# Patient Record
Sex: Female | Born: 1950 | Race: White | Hispanic: No | Smoking: Former smoker
Health system: Southern US, Community
[De-identification: ages and names within clinical notes are randomized; demographics above are authoritative.]

## PROBLEM LIST (undated history)

## (undated) DIAGNOSIS — F419 Anxiety disorder, unspecified: Secondary | ICD-10-CM

## (undated) DIAGNOSIS — E039 Hypothyroidism, unspecified: Secondary | ICD-10-CM

## (undated) DIAGNOSIS — Z9289 Personal history of other medical treatment: Secondary | ICD-10-CM

## (undated) DIAGNOSIS — K589 Irritable bowel syndrome without diarrhea: Secondary | ICD-10-CM

## (undated) DIAGNOSIS — K449 Diaphragmatic hernia without obstruction or gangrene: Secondary | ICD-10-CM

## (undated) DIAGNOSIS — R740 Nonspecific elevation of levels of transaminase and lactic acid dehydrogenase [LDH]: Secondary | ICD-10-CM

## (undated) DIAGNOSIS — I514 Myocarditis, unspecified: Secondary | ICD-10-CM

## (undated) DIAGNOSIS — K5730 Diverticulosis of large intestine without perforation or abscess without bleeding: Secondary | ICD-10-CM

## (undated) DIAGNOSIS — R7989 Other specified abnormal findings of blood chemistry: Secondary | ICD-10-CM

## (undated) DIAGNOSIS — I272 Pulmonary hypertension, unspecified: Secondary | ICD-10-CM

## (undated) DIAGNOSIS — E669 Obesity, unspecified: Secondary | ICD-10-CM

## (undated) DIAGNOSIS — B029 Zoster without complications: Secondary | ICD-10-CM

## (undated) DIAGNOSIS — R945 Abnormal results of liver function studies: Secondary | ICD-10-CM

## (undated) DIAGNOSIS — R7401 Elevation of levels of liver transaminase levels: Secondary | ICD-10-CM

## (undated) DIAGNOSIS — F329 Major depressive disorder, single episode, unspecified: Secondary | ICD-10-CM

## (undated) DIAGNOSIS — K621 Rectal polyp: Secondary | ICD-10-CM

## (undated) DIAGNOSIS — K219 Gastro-esophageal reflux disease without esophagitis: Secondary | ICD-10-CM

## (undated) DIAGNOSIS — K620 Anal polyp: Secondary | ICD-10-CM

## (undated) DIAGNOSIS — F32A Depression, unspecified: Secondary | ICD-10-CM

## (undated) HISTORY — DX: Gastro-esophageal reflux disease without esophagitis: K21.9

## (undated) HISTORY — DX: Other specified abnormal findings of blood chemistry: R79.89

## (undated) HISTORY — DX: Personal history of other medical treatment: Z92.89

## (undated) HISTORY — PX: TONSILLECTOMY AND ADENOIDECTOMY: SHX28

## (undated) HISTORY — DX: Anxiety disorder, unspecified: F41.9

## (undated) HISTORY — DX: Anal polyp: K62.0

## (undated) HISTORY — DX: Irritable bowel syndrome, unspecified: K58.9

## (undated) HISTORY — DX: Rectal polyp: K62.1

## (undated) HISTORY — DX: Zoster without complications: B02.9

## (undated) HISTORY — DX: Diverticulosis of large intestine without perforation or abscess without bleeding: K57.30

## (undated) HISTORY — DX: Diaphragmatic hernia without obstruction or gangrene: K44.9

## (undated) HISTORY — DX: Obesity, unspecified: E66.9

## (undated) HISTORY — DX: Abnormal results of liver function studies: R94.5

## (undated) HISTORY — DX: Depression, unspecified: F32.A

## (undated) HISTORY — PX: CHOLECYSTECTOMY: SHX55

## (undated) HISTORY — DX: Major depressive disorder, single episode, unspecified: F32.9

---

## 2007-09-07 ENCOUNTER — Ambulatory Visit: Payer: Self-pay | Admitting: Internal Medicine

## 2007-09-07 DIAGNOSIS — K219 Gastro-esophageal reflux disease without esophagitis: Secondary | ICD-10-CM | POA: Insufficient documentation

## 2007-09-07 DIAGNOSIS — R030 Elevated blood-pressure reading, without diagnosis of hypertension: Secondary | ICD-10-CM

## 2007-09-07 DIAGNOSIS — Z9189 Other specified personal risk factors, not elsewhere classified: Secondary | ICD-10-CM | POA: Insufficient documentation

## 2007-09-07 DIAGNOSIS — R51 Headache: Secondary | ICD-10-CM | POA: Insufficient documentation

## 2007-09-07 DIAGNOSIS — K620 Anal polyp: Secondary | ICD-10-CM

## 2007-09-07 DIAGNOSIS — M129 Arthropathy, unspecified: Secondary | ICD-10-CM | POA: Insufficient documentation

## 2007-09-07 DIAGNOSIS — J309 Allergic rhinitis, unspecified: Secondary | ICD-10-CM | POA: Insufficient documentation

## 2007-09-07 DIAGNOSIS — K621 Rectal polyp: Secondary | ICD-10-CM

## 2007-09-07 DIAGNOSIS — F329 Major depressive disorder, single episode, unspecified: Secondary | ICD-10-CM

## 2007-09-07 DIAGNOSIS — R519 Headache, unspecified: Secondary | ICD-10-CM | POA: Insufficient documentation

## 2007-09-07 HISTORY — DX: Anal polyp: K62.0

## 2007-09-21 ENCOUNTER — Ambulatory Visit: Payer: Self-pay | Admitting: Internal Medicine

## 2007-09-21 LAB — CONVERTED CEMR LAB: Vit D, 1,25-Dihydroxy: 8 — ABNORMAL LOW (ref 30–89)

## 2007-09-23 LAB — CONVERTED CEMR LAB
ALT: 14 units/L (ref 0–35)
Albumin: 3.7 g/dL (ref 3.5–5.2)
Alkaline Phosphatase: 72 units/L (ref 39–117)
BUN: 9 mg/dL (ref 6–23)
Basophils Absolute: 0 10*3/uL (ref 0.0–0.1)
Basophils Relative: 0.7 % (ref 0.0–1.0)
CO2: 33 meq/L — ABNORMAL HIGH (ref 19–32)
Chloride: 106 meq/L (ref 96–112)
Cholesterol: 171 mg/dL (ref 0–200)
GFR calc non Af Amer: 79 mL/min
LDL Cholesterol: 119 mg/dL — ABNORMAL HIGH (ref 0–99)
Monocytes Absolute: 0.4 10*3/uL (ref 0.2–0.7)
Neutrophils Relative %: 60.8 % (ref 43.0–77.0)
Platelets: 228 10*3/uL (ref 150–400)
TSH: 7.32 microintl units/mL — ABNORMAL HIGH (ref 0.35–5.50)
Total Bilirubin: 1.4 mg/dL — ABNORMAL HIGH (ref 0.3–1.2)
Total CHOL/HDL Ratio: 4.9
Total Protein: 6.7 g/dL (ref 6.0–8.3)
Triglycerides: 86 mg/dL (ref 0–149)

## 2007-09-24 LAB — CONVERTED CEMR LAB
Bilirubin Urine: NEGATIVE
Protein, U semiquant: NEGATIVE
Urobilinogen, UA: 0.2
WBC Urine, dipstick: NEGATIVE
pH: 6

## 2007-09-26 LAB — CONVERTED CEMR LAB
ALT: 14 units/L (ref 0–35)
AST: 26 units/L (ref 0–37)
Albumin: 3.7 g/dL (ref 3.5–5.2)
Basophils Relative: 0.7 % (ref 0.0–1.0)
Bilirubin, Direct: 0.2 mg/dL (ref 0.0–0.3)
Calcium: 9.3 mg/dL (ref 8.4–10.5)
Cholesterol: 171 mg/dL (ref 0–200)
Creatinine, Ser: 0.8 mg/dL (ref 0.4–1.2)
Eosinophils Relative: 3.7 % (ref 0.0–5.0)
GFR calc non Af Amer: 79 mL/min
HCT: 38.9 % (ref 36.0–46.0)
HDL: 34.8 mg/dL — ABNORMAL LOW (ref >39.0)
LDL Cholesterol: 119 mg/dL — ABNORMAL HIGH (ref 0–99)
MCHC: 34.4 g/dL (ref 30.0–36.0)
Monocytes Absolute: 0.4 10*3/uL (ref 0.2–0.7)
Neutrophils Relative %: 60.8 % (ref 43.0–77.0)
Potassium: 4 meq/L (ref 3.5–5.1)
RBC: 4.32 M/uL (ref 3.87–5.11)
RDW: 12.9 % (ref 11.5–14.6)
Total Protein: 6.7 g/dL (ref 6.0–8.3)

## 2007-09-28 ENCOUNTER — Ambulatory Visit: Payer: Self-pay | Admitting: Internal Medicine

## 2007-09-28 DIAGNOSIS — E559 Vitamin D deficiency, unspecified: Secondary | ICD-10-CM | POA: Insufficient documentation

## 2007-09-28 DIAGNOSIS — E782 Mixed hyperlipidemia: Secondary | ICD-10-CM | POA: Insufficient documentation

## 2007-09-28 DIAGNOSIS — R946 Abnormal results of thyroid function studies: Secondary | ICD-10-CM | POA: Insufficient documentation

## 2007-09-29 ENCOUNTER — Encounter: Payer: Self-pay | Admitting: Internal Medicine

## 2007-11-11 LAB — HM COLONOSCOPY: HM Colonoscopy: NORMAL

## 2007-11-16 ENCOUNTER — Telehealth (INDEPENDENT_AMBULATORY_CARE_PROVIDER_SITE_OTHER): Payer: Self-pay | Admitting: *Deleted

## 2007-11-23 ENCOUNTER — Encounter: Admission: RE | Admit: 2007-11-23 | Discharge: 2007-11-23 | Payer: Self-pay | Admitting: Internal Medicine

## 2007-11-30 ENCOUNTER — Ambulatory Visit: Payer: Self-pay | Admitting: Internal Medicine

## 2007-12-07 ENCOUNTER — Ambulatory Visit: Payer: Self-pay | Admitting: Internal Medicine

## 2007-12-10 ENCOUNTER — Encounter: Payer: Self-pay | Admitting: Internal Medicine

## 2007-12-30 ENCOUNTER — Ambulatory Visit: Payer: Self-pay | Admitting: Gastroenterology

## 2007-12-30 LAB — CONVERTED CEMR LAB
ALT: 13 units/L (ref 0–35)
AST: 20 units/L (ref 0–37)
Bilirubin, Direct: 0.1 mg/dL (ref 0.0–0.3)
CO2: 29 meq/L (ref 19–32)
Calcium: 9.6 mg/dL (ref 8.4–10.5)
Creatinine, Ser: 0.8 mg/dL (ref 0.4–1.2)
Ferritin: 35.7 ng/mL (ref 10.0–291.0)
GFR calc Af Amer: 96 mL/min
GFR calc non Af Amer: 79 mL/min
Glucose, Bld: 81 mg/dL (ref 70–99)
HCT: 39.8 % (ref 36.0–46.0)
Lymphocytes Relative: 32.3 % (ref 12.0–46.0)
MCHC: 32.5 g/dL (ref 30.0–36.0)
Monocytes Absolute: 0.5 10*3/uL (ref 0.2–0.7)
Monocytes Relative: 8.7 % (ref 3.0–11.0)
Neutrophils Relative %: 55.2 % (ref 43.0–77.0)
Platelets: 241 10*3/uL (ref 150–400)
RDW: 13.3 % (ref 11.5–14.6)
TSH: 3.45 microintl units/mL (ref 0.35–5.50)
Tissue Transglutaminase Ab, IgA: 0.4 units (ref <7)
Total Bilirubin: 1 mg/dL (ref 0.3–1.2)
Total Protein: 7.1 g/dL (ref 6.0–8.3)
Vitamin B-12: 193 pg/mL — ABNORMAL LOW (ref 211–911)
WBC: 5.6 10*3/uL (ref 4.5–10.5)

## 2008-01-14 ENCOUNTER — Ambulatory Visit: Payer: Self-pay | Admitting: Gastroenterology

## 2008-01-21 ENCOUNTER — Ambulatory Visit: Payer: Self-pay | Admitting: Gastroenterology

## 2008-01-31 ENCOUNTER — Encounter: Payer: Self-pay | Admitting: Internal Medicine

## 2008-01-31 ENCOUNTER — Ambulatory Visit: Payer: Self-pay | Admitting: Gastroenterology

## 2008-01-31 ENCOUNTER — Encounter: Payer: Self-pay | Admitting: Gastroenterology

## 2008-01-31 DIAGNOSIS — K5730 Diverticulosis of large intestine without perforation or abscess without bleeding: Secondary | ICD-10-CM | POA: Insufficient documentation

## 2008-01-31 DIAGNOSIS — K449 Diaphragmatic hernia without obstruction or gangrene: Secondary | ICD-10-CM | POA: Insufficient documentation

## 2008-02-23 ENCOUNTER — Ambulatory Visit: Payer: Self-pay | Admitting: Internal Medicine

## 2008-02-27 LAB — CONVERTED CEMR LAB: Vit D, 1,25-Dihydroxy: 24 — ABNORMAL LOW (ref 30–89)

## 2008-02-29 ENCOUNTER — Ambulatory Visit: Payer: Self-pay | Admitting: Internal Medicine

## 2008-02-29 DIAGNOSIS — E063 Autoimmune thyroiditis: Secondary | ICD-10-CM | POA: Insufficient documentation

## 2008-03-28 ENCOUNTER — Ambulatory Visit: Payer: Self-pay | Admitting: Gastroenterology

## 2008-12-12 ENCOUNTER — Ambulatory Visit: Payer: Self-pay | Admitting: Internal Medicine

## 2008-12-12 DIAGNOSIS — M25519 Pain in unspecified shoulder: Secondary | ICD-10-CM

## 2008-12-25 LAB — CONVERTED CEMR LAB: TSH: 4.71 microintl units/mL (ref 0.35–5.50)

## 2009-01-23 ENCOUNTER — Telehealth: Payer: Self-pay | Admitting: *Deleted

## 2009-03-27 ENCOUNTER — Ambulatory Visit: Payer: Self-pay | Admitting: Internal Medicine

## 2009-03-30 ENCOUNTER — Telehealth: Payer: Self-pay | Admitting: Family Medicine

## 2009-04-05 ENCOUNTER — Ambulatory Visit: Payer: Self-pay | Admitting: Internal Medicine

## 2009-04-05 LAB — CONVERTED CEMR LAB
AST: 26 units/L (ref 0–37)
Albumin: 3.5 g/dL (ref 3.5–5.2)
Alkaline Phosphatase: 62 units/L (ref 39–117)
Amylase: 45 units/L (ref 27–131)
Basophils Absolute: 0 10*3/uL (ref 0.0–0.1)
Bilirubin, Direct: 0.1 mg/dL (ref 0.0–0.3)
Calcium: 8.7 mg/dL (ref 8.4–10.5)
Eosinophils Relative: 3.8 % (ref 0.0–5.0)
GFR calc non Af Amer: 92.02 mL/min (ref >60)
Glucose, Bld: 98 mg/dL (ref 70–99)
HCT: 37.1 % (ref 36.0–46.0)
HDL: 43.8 mg/dL (ref >39.00)
LDL Cholesterol: 110 mg/dL — ABNORMAL HIGH (ref 0–99)
Lipase: 17 units/L (ref 11.0–59.0)
Lymphocytes Relative: 28.5 % (ref 12.0–46.0)
Lymphs Abs: 1.2 10*3/uL (ref 0.7–4.0)
Monocytes Relative: 7.7 % (ref 3.0–12.0)
Neutrophils Relative %: 59.3 % (ref 43.0–77.0)
Platelets: 229 10*3/uL (ref 150.0–400.0)
Potassium: 4.1 meq/L (ref 3.5–5.1)
RDW: 13.3 % (ref 11.5–14.6)
Sodium: 141 meq/L (ref 135–145)
TSH: 3.82 microintl units/mL (ref 0.35–5.50)
Total CHOL/HDL Ratio: 4
VLDL: 15.2 mg/dL (ref 0.0–40.0)
WBC: 4.3 10*3/uL — ABNORMAL LOW (ref 4.5–10.5)

## 2009-04-18 ENCOUNTER — Ambulatory Visit: Payer: Self-pay | Admitting: Internal Medicine

## 2009-04-18 DIAGNOSIS — E669 Obesity, unspecified: Secondary | ICD-10-CM

## 2009-06-14 DIAGNOSIS — E039 Hypothyroidism, unspecified: Secondary | ICD-10-CM | POA: Insufficient documentation

## 2009-07-30 ENCOUNTER — Ambulatory Visit: Payer: Self-pay | Admitting: Internal Medicine

## 2009-08-02 ENCOUNTER — Telehealth: Payer: Self-pay | Admitting: Internal Medicine

## 2009-09-05 ENCOUNTER — Ambulatory Visit: Payer: Self-pay | Admitting: Internal Medicine

## 2009-09-13 ENCOUNTER — Ambulatory Visit: Payer: Self-pay | Admitting: Internal Medicine

## 2010-02-25 ENCOUNTER — Ambulatory Visit: Payer: Self-pay | Admitting: Internal Medicine

## 2010-03-25 ENCOUNTER — Ambulatory Visit: Payer: Self-pay | Admitting: Internal Medicine

## 2010-03-25 LAB — CONVERTED CEMR LAB
ALT: 11 units/L (ref 0–35)
AST: 19 units/L (ref 0–37)
Albumin: 3.8 g/dL (ref 3.5–5.2)
BUN: 14 mg/dL (ref 6–23)
Chloride: 102 meq/L (ref 96–112)
Cholesterol: 164 mg/dL (ref 0–200)
Eosinophils Relative: 3.4 % (ref 0.0–5.0)
GFR calc non Af Amer: 77.49 mL/min (ref >60)
Glucose, Bld: 89 mg/dL (ref 70–99)
Glucose, Urine, Semiquant: NEGATIVE
HCT: 40.2 % (ref 36.0–46.0)
Hemoglobin: 13.7 g/dL (ref 12.0–15.0)
LDL Cholesterol: 96 mg/dL (ref 0–99)
Lymphs Abs: 1.6 10*3/uL (ref 0.7–4.0)
MCV: 91.8 fL (ref 78.0–100.0)
Monocytes Absolute: 0.5 10*3/uL (ref 0.1–1.0)
Monocytes Relative: 7.8 % (ref 3.0–12.0)
Neutro Abs: 3.6 10*3/uL (ref 1.4–7.7)
Potassium: 5 meq/L (ref 3.5–5.1)
Protein, U semiquant: NEGATIVE
Sodium: 143 meq/L (ref 135–145)
TSH: 3.01 microintl units/mL (ref 0.35–5.50)
Total Protein: 6.7 g/dL (ref 6.0–8.3)
VLDL: 21.6 mg/dL (ref 0.0–40.0)
WBC Urine, dipstick: NEGATIVE
WBC: 5.9 10*3/uL (ref 4.5–10.5)
pH: 6

## 2010-04-01 ENCOUNTER — Ambulatory Visit: Payer: Self-pay | Admitting: Internal Medicine

## 2010-04-01 ENCOUNTER — Other Ambulatory Visit: Admission: RE | Admit: 2010-04-01 | Discharge: 2010-04-01 | Payer: Self-pay | Admitting: Internal Medicine

## 2010-04-01 LAB — HM PAP SMEAR

## 2010-04-03 LAB — CONVERTED CEMR LAB: Pap Smear: NEGATIVE

## 2010-09-16 ENCOUNTER — Ambulatory Visit: Payer: Self-pay | Admitting: Internal Medicine

## 2010-09-16 DIAGNOSIS — M549 Dorsalgia, unspecified: Secondary | ICD-10-CM | POA: Insufficient documentation

## 2010-09-16 DIAGNOSIS — R350 Frequency of micturition: Secondary | ICD-10-CM

## 2010-09-16 LAB — CONVERTED CEMR LAB
Nitrite: NEGATIVE
Urobilinogen, UA: 0.2

## 2010-09-17 ENCOUNTER — Encounter: Payer: Self-pay | Admitting: Internal Medicine

## 2010-09-19 ENCOUNTER — Telehealth: Payer: Self-pay | Admitting: *Deleted

## 2010-10-16 ENCOUNTER — Ambulatory Visit: Payer: Self-pay | Admitting: Internal Medicine

## 2010-12-02 ENCOUNTER — Encounter: Payer: Self-pay | Admitting: Internal Medicine

## 2010-12-10 NOTE — Assessment & Plan Note (Signed)
Summary: swollen wrist/dm   Vital Signs:  Patient profile:   60 year old female Menstrual status:  postmenopausal Height:      65 inches Weight:      224 pounds BMI:     37.41 Temp:     98.4 degrees F oral Pulse rate:   72 / minute BP sitting:   120 / 80  (left arm) Cuff size:   large  Vitals Entered By: Romualdo Bolk, CMA (AAMA) (February 25, 2010 10:50 AM) CC: Left Wrist pain and swelling that started on 4/17. Pt doesn't remember injuring it.    History of Present Illness: Shelby Wong comes in for acute visit for  insidious but rapidly  progressing  pain and swelling left wrist   after  bringing grocery shopping and no known event .   and no problem with this before.  no injury . Right handed . used ice and no meds and then getting worse  as time goes along . Hurts along ulnar side of wrist .  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  hurst with twisting like opening jars etc.    No hx of arthritis or gout . No falls .  Preventive Screening-Counseling & Management  Alcohol-Tobacco     Alcohol drinks/day: <1     Alcohol type: wine     Smoking Status: quit     Year Quit: Apr 05, 2004  Caffeine-Diet-Exercise     Caffeine use/day: 1 a week     Does Patient Exercise: no  Current Medications (verified): 1)  Claritin 10 Mg  Caps (Loratadine) .... As Needed 2)  Prozac 10 Mg  Caps (Fluoxetine Hcl) .... 2 By Mouth Once Daily 3)  Vitamin D 400 Unit  Tabs (Cholecalciferol) .... Pt Takes 1,000 International Units Daily 4)  Tylenol Extra Strength 500 Mg  Tabs (Acetaminophen) 5)  Prilosec 20 Mg Cpdr (Omeprazole) .Marland Kitchen.. 1 By Mouth Twice  Daily or As Directly 6)  Levothroid 100 Mcg Tabs (Levothyroxine Sodium) .Marland Kitchen.. 1 By Mouth Once Daily 7)  Flonase 50 Mcg/act Susp (Fluticasone Propionate) .... 2 Sprays Each Nares Q D  Allergies (verified): No Known Drug Allergies  Past History:  Past medical, surgical, family and social histories (including risk factors) reviewed, and no changes  noted (except as noted below).  Past Medical History: Reviewed history from 04/18/2009 and no changes required. 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 Ulyses Amor  Past Surgical History: Reviewed history from 09/07/2007 and no changes required. Cholecystectomy Tonsillectomy Adenoidectomy  Past History:  Care Management: Orthopedics: unsure Gastroenterology:[facility] Ophthalmology:[facility]   Family History: Reviewed history from 12/12/2008 and no changes required. mom had arthritis OA  Family History of Arthritis- Mother Family History Breast cancer 1st degree relative <50-Mother Family History Diabetes 1st degree relative- Brother 63's  Family History of CAD Female 1st degree relative mom Family History Thyroid disease hearing loss in mom Mom died 55 90 Apr 05, 2008  Social History: Reviewed history from 12/12/2008 and no changes required. Occupation: Chemical engineer Rep Divorced Former Smoker Alcohol use-yes Regular exercise-no Household of 2-3 caretaker for mom mom died in 2008-11-05 Review of Systems  The patient denies anorexia, fever, weight loss, weight gain, vision loss, chest pain, headaches, abdominal pain, difficulty walking, abnormal bleeding, enlarged lymph nodes, and angioedema.    Physical Exam  General:  Well-developed,well-nourished,in no acute distress; alert,appropriate and cooperative throughout examination Head:  normocephalic and atraumatic.   Msk:  left wrise with some swelling below ulnar prominence and tenderness    some swelling in comparison to right in srist area . No pint tenderness but pink area over the ulnar prominance but no pain there.   pain with rotation  not much with lateral or flexion.  Pulses:  NV ok  Neurologic:  alert & oriented X3 and gait normal.   Skin:  turgor normal and color normal.  except see over wrist  Cervical Nodes:  No lymphadenopathy noted   Impression &  Recommendations:  Problem # 1:  WRIST PAIN, LEFT (ICD-719.43) with local swelling   unsure what to make of the small area of erythema  . not at the joint poper but lateral wrist  and not along tendon  or bursal area .  could be from rubbing and  holding area.    I suppose a tendinitis possible  . no systemic symptom .   will rx with nsaid and if persistent or  progressive  further eval .   Doesnt look like gout  by location  but character and progression acts similar..  Orders: T-Wrist Comp Left Min 3 Views ([id number])  Complete Medication List: 1)  Claritin 10 Mg Caps (Loratadine) .... As needed 2)  Prozac 10 Mg Caps (Fluoxetine hcl) .... 2 by mouth once daily 3)  Vitamin D 400 Unit Tabs (Cholecalciferol) .... Pt takes 1,000 international units daily 4)  Tylenol Extra Strength 500 Mg Tabs (Acetaminophen) 5)  Prilosec 20 Mg Cpdr (Omeprazole) .Marland Kitchen.. 1 by mouth twice  daily or as directly 6)  Levothroid 100 Mcg Tabs (Levothyroxine sodium) .Marland Kitchen.. 1 by mouth once daily 7)  Flonase 50 Mcg/act Susp (Fluticasone propionate) .... 2 sprays each nares q d 8)  Naproxen 500 Mg Tabs (Naproxen) .Marland Kitchen.. 1 by mouth two times a day  Patient Instructions: 1)  take antiinflammatory   for pain and swelling  2)  relative rest.  3)  Get x ray today  and will let your know results. 4)  NO keyboarding today  .    5)  call tomorrow about how this is doing   .   if getting very red . and spreading call and we may need to get ortho to see you.  Prescriptions: NAPROXEN 500 MG TABS (NAPROXEN) 1 by mouth two times a day  #40 x 0   Entered and Authorized by:   Madelin Headings MD   Signed by:   Madelin Headings MD on 02/25/2010   Method used:   Electronically to        [id number] (retail)       [address]       [place], [place]  [address]       Ph: [phone] or [phone]       Fax: [phone]   RxID:   [id number]

## 2010-12-10 NOTE — Assessment & Plan Note (Signed)
Summary: pt is no better/njr/pt rescd//ccm   Vital Signs:  Patient profile:   60 year old female Menstrual status:  postmenopausal Weight:      213 pounds O2 Sat:      97 % on Room air Temp:     99.0 degrees F oral Pulse rate:   99 / minute BP sitting:   116 / 80  (left arm) Cuff size:   large  Vitals Entered By: Romualdo Bolk, CMA (AAMA) (October 18, 2010 8:33 AM)  O2 Flow:  Room air CC: Pt is still having back that won't go away. It has gotten some better.   History of Present Illness: Shelby Wong today for   follow up of   low back pain  in the  ls area   better than before .  but persistent.   No more radiation to front abd right.   ? if from sitting long time at her job. .   Is about 70 -80 % better .    Always there but   then worse with sitting .  at t imes with sitting  Walking:  no worse  but   tending to walikng forward. No leg pain.  Sleep ok if onside .   NO new signs such as cp sob and no uti signs at present.  Unsure what next step is .   has used some nsaid with caution.   Preventive Screening-Counseling & Management  Alcohol-Tobacco     Alcohol drinks/day: <1     Alcohol type: wine     Smoking Status: quit     Year Quit: 29-Mar-2004  Caffeine-Diet-Exercise     Caffeine use/day: 1 a week     Does Patient Exercise: no  Current Medications (verified): 1)  Claritin 10 Mg  Caps (Loratadine) .... As Needed 2)  Prozac 10 Mg  Caps (Fluoxetine Hcl) .... 2 By Mouth Once Daily 3)  Vitamin D 400 Unit  Tabs (Cholecalciferol) .... Pt Takes 1,000 International Units Daily 4)  Tylenol Extra Strength 500 Mg  Tabs (Acetaminophen) 5)  Prilosec 20 Mg Cpdr (Omeprazole) .Marland Kitchen.. 1 By Mouth Twice  Daily or As Directly 6)  Levothroid 100 Mcg Tabs (Levothyroxine Sodium) .Marland Kitchen.. 1 By Mouth Once Daily  Allergies (verified): No Known Drug Allergies  Past History:  Past medical, surgical, family and social histories (including risk factors) reviewed, and no changes noted (except as  noted below).  Past Medical History: Reviewed history from 04/18/2009 and no changes required. 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 Ulyses Amor  Past Surgical History: Reviewed history from 09/07/2007 and no changes required. Cholecystectomy Tonsillectomy Adenoidectomy  Past History:  Care Management: Orthopedics: unsure [facility]astroenterology:[facility] Ophthalmology:[facility]   Family History: Reviewed history from 02/25/2010 and no changes required. mom had arthritis OA  Family History of Arthritis- Mother Family History Breast cancer 1st degree relative <50-Mother Family History Diabetes 1st degree relative- Brother 27's  Family History of CAD Female 1st degree relative mom Family History Thyroid disease hearing loss in mom Mom died 57 65 03-29-08  Social History: Reviewed history from 04/01/2010 and no changes required. Occupation: Chemical engineer Rep  hhof 1   Divorced Former Smoker Alcohol use-yes Regular exercise-no Was caretaker for mom  who died in 2008-10-29 Review of Systems  The patient denies anorexia, fever, weight gain, chest pain, abdominal pain, melena, hematochezia, severe indigestion/heartburn, hematuria, muscle weakness, abnormal bleeding, and enlarged lymph nodes.  intentional weight loss   Physical Exam  General:  Well-developed,well-nourished,in no acute distress; alert,appropriate and cooperative throughout examination Head:  Normocephalic and atraumatic without obvious abnormalities. No apparent alopecia or balding. Eyes:  clear  Lungs:  normal respiratory effort, no intercostal retractions, and no accessory muscle use.   Heart:  normal rate and regular rhythm.   Abdomen:  soft, non-tender, no distention, no masses, no rebound tenderness, no abdominal hernia, no inguinal hernia, no hepatomegaly, and no splenomegaly.   no flank pain  Msk:  no joint swelling and no joint warmth.   location of  pain is LS area and some to right  mild m spasm  Pulses:  pulses intact without delay   Extremities:  no clubbing cyanosis or edema  Neurologic:  alert & oriented X3.  gait  slightly bend  nl weakness  Skin:  turgor normal, color normal, no ecchymoses, and no petechiae.   Cervical Nodes:  No lymphadenopathy noted Psych:  Oriented X3, normally interactive, good eye contact, not anxious appearing, and not depressed appearing.     Impression & Recommendations:  Problem # 1:  BACK PAIN (ICD-724.5) probably mechanical by history context and course.   disc options .   for now will go with nsaids agin wioth caution and    can do pt .  x ray consider  and other if persistent or  progressive   Her updated medication list for this problem includes:    Tylenol Extra Strength 500 Mg Tabs (Acetaminophen)    Cyclobenzaprine Hcl 10 Mg Tabs (Cyclobenzaprine hcl) .Marland Kitchen... 1 by mouth three times a day if needed for muscle spasm.  Problem # 2:  OBESITY (ICD-278.00) continue weight loss Ht: 65 (04/01/2010)   Wt: 213 (10/18/2010)   BMI: 37.41 (04/01/2010)  Complete Medication List: 1)  Claritin 10 Mg Caps (Loratadine) .... As needed 2)  Prozac 10 Mg Caps (Fluoxetine hcl) .... 2 by mouth once daily 3)  Vitamin D 400 Unit Tabs (Cholecalciferol) .... Pt takes 1,000 international units daily 4)  Tylenol Extra Strength 500 Mg Tabs (Acetaminophen) 5)  Prilosec 20 Mg Cpdr (Omeprazole) .Marland Kitchen.. 1 by mouth twice  daily or as directly 6)  Levothroid 100 Mcg Tabs (Levothyroxine sodium) .Marland Kitchen.. 1 by mouth once daily 7)  Cyclobenzaprine Hcl 10 Mg Tabs (Cyclobenzaprine hcl) .Marland Kitchen.. 1 by mouth three times a day if needed for muscle spasm.  Patient Instructions: 1)  this sounds like mechanical ....back pain like arthritis. 2)  Avoid prolonged sitting.   Check work station.  3)  Consider PT . if persistent or  progressive   4)  Call if fever . 5)  Call for x ray order if persistent or  progressive   Prescriptions: CYCLOBENZAPRINE  HCL 10 MG TABS (CYCLOBENZAPRINE HCL) 1 by mouth three times a day if needed for muscle spasm.  #30 x 0   Entered and Authorized by:   Madelin Headings MD   Signed by:   Madelin Headings MD on 10/18/2010   Method used:   Electronically to        [id number]       [address]       [place], [place]  [address]       Ph: [phone] or [phone]       Fax: [phone]   RxID:   [id number]    Orders Added: 1)  Est. Patient Level III [32951]

## 2010-12-10 NOTE — Assessment & Plan Note (Signed)
Summary: CPX//SLM   Vital Signs:  Patient profile:   60 year old female Menstrual status:  postmenopausal Height:      65 inches Weight:      224 pounds BMI:     37.41 Temp:     98.4 degrees F oral BP sitting:   120 / 80  (right arm)  Vitals Entered By: Kathrynn Speed CMA (Apr 01, 2010 9:22 AM) CC: CPX / Shelby Wong, Headache Is Patient Diabetic? No   History of Present Illness: Shelby Wong comesin comes in today   for preventive visit  Since last visit  here  there have been no major changes in health status  . Left wrist is getting better   but no avascular  problem to follow up  alo.   Allergies not that bad this year.  taking otc meds  Thyroid:      no missed pills  GERD  Taking prilosec  daily  Mood  2 prozac per day and doing well  20 mg per day/..   maintaining,   Preventive Care Screening  Colonoscopy:    Date:  11/11/2007    Results:  normal     Preventive Screening-Counseling & Management  Alcohol-Tobacco     Alcohol drinks/day: <1     Alcohol type: wine     Smoking Status: quit     Year Quit: 04/06/04  Caffeine-Diet-Exercise     Caffeine use/day: 1 a week     Does Patient Exercise: no  Hep-HIV-STD-Contraception     Dental Visit-last 6 months yes     04-07-23 Exposure-Excessive: no  Safety-Violence-Falls     Seat Belt Use: yes     Firearms in the Home: no firearms in the home     Smoke Detectors: yes      Blood Transfusions:  no.    Current Medications (verified): 1)  Claritin 10 Mg  Caps (Loratadine) .... As Needed 2)  Prozac 10 Mg  Caps (Fluoxetine Hcl) .... 2 By Mouth Once Daily 3)  Vitamin D 400 Unit  Tabs (Cholecalciferol) .... Pt Takes 1,000 International Units Daily 4)  Tylenol Extra Strength 500 Mg  Tabs (Acetaminophen) 5)  Prilosec 20 Mg Cpdr (Omeprazole) .Marland Kitchen.. 1 By Mouth Twice  Daily or As Directly 6)  Levothroid 100 Mcg Tabs (Levothyroxine Sodium) .Marland Kitchen.. 1 By Mouth Once Daily  Allergies (verified): No Known Drug Allergies  Past History:  Past  medical, surgical, family and social histories (including risk factors) reviewed, and no changes noted (except as noted below).  Past Medical History: Reviewed history from 04/18/2009 and no changes required. 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 Ulyses Amor  Past Surgical History: Reviewed history from 09/07/2007 and no changes required. Cholecystectomy Tonsillectomy Adenoidectomy  Past History:  Care Management: Orthopedics: unsure [facility]astroenterology:[facility] Ophthalmology:[facility]   Family History: Reviewed history from 02/25/2010 and no changes required. mom had arthritis OA  Family History of Arthritis- Mother Family History Breast cancer 1st degree relative <50-Mother Family History Diabetes 1st degree relative- Brother 9's  Family History of CAD Female 1st degree relative mom Family History Thyroid disease hearing loss in mom Mom died 35 96 04/06/2008  Social History: Reviewed history from 12/12/2008 and no changes required. Occupation: Chemical engineer Rep  hhof 1   Divorced Former Smoker Alcohol use-yes Regular exercise-no Was caretaker for mom  who died in Dec 28, 2009Seat Belt Use:  yes Sun Exposure-Excessive:  no Dental Care w/in 6 mos.:  yes Blood  Transfusions:  no  Review of Systems  The patient denies anorexia, fever, weight loss, weight gain, decreased hearing, hoarseness, chest pain, syncope, dyspnea on exertion, peripheral edema, prolonged cough, headaches, hemoptysis, abdominal pain, melena, hematochezia, severe indigestion/heartburn, hematuria, genital sores, muscle weakness, suspicious skin lesions, transient blindness, difficulty walking, depression, unusual weight change, abnormal bleeding, and breast masses.         crackling  left ear  itchy at times   slight bump near left labia  gone now.   Physical Exam General Appearance: well developed, well nourished, no acute distress Eyes: conjunctiva and  lids normal, PERRLA, EOMI, WNL Ears, Nose, Mouth, Throat: TM clear, nares clear, oral exam WNL Neck: supple, no lymphadenopathy, no thyromegaly, no JVD Respiratory: clear to auscultation and percussion, respiratory effort normal Cardiovascular: regular rate and rhythm, S1-S2, no murmur, rub or gallop, no bruits, peripheral pulses normal and symmetric, no cyanosis, clubbing, edema or varicosities Chest: no scars, masses, tenderness; no asymmetry, skin changes, nipple discharge   Gastrointestinal: soft, non-tender; no hepatosplenomegaly, masses; active bowel sounds all quadrants, guaiac negative stool; no masses, tenderness, hemorrhoids  Genitourinary: no vaginal discharge, lesions; no masses or tenderness cx pap done  Lymphatic: no cervical, axillary or inguinal adenopathy Musculoskeletal: gait normal, muscle tone and strength WNL, no joint swelling, effusions, discoloration, crepitus  Skin: clear, good turgor, color WNL, no rashes, lesions, or ulcerations Neurologic: normal mental status, normal reflexes, normal strength, sensation, and motion Psychiatric: alert; oriented to person, place and time Other Exam:  EKG  NSR      Impression & Recommendations:  Problem # 1:  PREVENTIVE HEALTH CARE (ICD-V70.0)  Discussed nutrition,exercise,diet,healthy weight, vitamin D and calcium.  tdap.    Orders: EKG w/ Interpretation (93000)  Problem # 2:  HYPOTHYROIDISM (ICD-244.9)  Her updated medication list for this problem includes:    Levothroid 100 Mcg Tabs (Levothyroxine sodium) .Marland Kitchen... 1 by mouth once daily  Labs Reviewed: TSH: 3.01 (03/25/2010)    Chol: 164 (03/25/2010)   HDL: 46.00 (03/25/2010)   LDL: 96 (03/25/2010)   TG: 108.0 (03/25/2010)  Problem # 3:  HYPERLIPIDEMIA (ICD-272.2)  Labs Reviewed: SGOT: 19 (03/25/2010)   SGPT: 11 (03/25/2010)   HDL:46.00 (03/25/2010), 43.80 (04/05/2009)  LDL:96 (03/25/2010), 110 (16/08/9603)  Chol:164 (03/25/2010), 169 (04/05/2009)  Trig:108.0  (03/25/2010), 76.0 (04/05/2009)  Problem # 4:  WRIST PAIN, LEFT (ICD-719.43) improved     to FU  dr Amanda Pea  may have been an overuse issue  Problem # 5:  OBESITY (ICD-278.00) Assessment: Comment Only  Problem # 6:  DISORDER, 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 1 by mouth twice  daily or as directly  Complete Medication List: 1)  Claritin 10 Mg Caps (Loratadine) .... As needed 2)  Prozac 10 Mg Caps (Fluoxetine hcl) .... 2 by mouth once daily 3)  Vitamin D 400 Unit Tabs (Cholecalciferol) .... Pt takes 1,000 international units daily 4)  Tylenol Extra Strength 500 Mg Tabs (Acetaminophen) 5)  Prilosec 20 Mg Cpdr (Omeprazole) .Marland Kitchen.. 1 by mouth twice  daily or as directly 6)  Levothroid 100 Mcg Tabs (Levothyroxine sodium) .Marland Kitchen.. 1 by mouth once daily  Other Orders: Tdap => 61yrs IM (11914) Admin  1st Vaccine (16109)  Patient Instructions: 1)  can use prilosec every other day if  tolerates    2)  gree with healthy weight loss and exercise.  3)  You will be informed of lab/pap results when available.  4)  rov in 6 months or as needed.   Immunizations Administered:  Tetanus Vaccine:    Vaccine Type: Tdap    Site: right deltoid    Mfr: GlaxoSmithKline    Dose: 0.5 ml    Route: IM    Given by: Romualdo Bolk, CMA (AAMA)    Exp. Date: 02/02/2012    Lot #: [id number]

## 2010-12-10 NOTE — Progress Notes (Signed)
Summary: results needed-Still having pain  Phone Note Call from Patient Call back at Home Phone [phone]   Caller: Patient---live call Reason for Call: Lab or Test Results Summary of Call: wants urine results. Initial call taken by: Warnell Forester,  September 19, 2010 8:49 AM  Follow-up for Phone Call        Pt aware of results. Pt states that she is still having abd. pain and lower back pain. She states that she does feel some better and slept fine last night. Pt states that she doesn't feel like she can set in a chair all day. Now when she raises rt leg up she feels a pulling in her back. Follow-up by: Romualdo Bolk, CMA Duncan Dull),  September 19, 2010 9:10 AM  Additional Follow-up for Phone Call Additional follow up Details #1::        Her urione culture is no growth .    can try  adding muscle realxant at night  if she wishes . flexeril 10 three times a day  .  if not improving after another week or so  then reevaluate Additional Follow-up by: Madelin Headings MD,  September 19, 2010 12:30 PM    Additional Follow-up for Phone Call Additional follow up Details #2::    Spoke to pt and she does feel better today. Pt wants to hold off on the muscle relaxer for now. Pt to call back if it gets worse. Follow-up by: Romualdo Bolk, CMA Duncan Dull),  September 19, 2010 12:34 PM

## 2010-12-10 NOTE — Assessment & Plan Note (Signed)
Summary: lower back pain//ccm   Vital Signs:  Patient profile:   60 year old female Menstrual status:  postmenopausal Weight:      213 pounds Temp:     98.1 degrees F oral Pulse rate:   72 / minute BP sitting:   120 / 80  (left arm) Cuff size:   large  Vitals Entered By: Romualdo Bolk, CMA (AAMA) (September 16, 2010 9:51 AM) CC: Lower back and abd. pain, urinary frequency but no burning upon urination. Pt states that this started on 11/5.   History of Present Illness: Shelby Wong comes in today  for acute  appt SDA for above. Onset 2 days ago  when shopping . then went home and used heating pad and  took tylenol  .Had slight improvement and then  became worse yesterday  as day went on.  feels bloated . pain right back and radiates to right lower  abd groin area not down leg.   hurts to move certain way . No NVD  Increase urinating    also  ? hard to empty/ no discrete dysuria or hematuria . Unsure if from a uti. No chills fever .  also due for" med check" for the prozac   doing well no need to change  .    Preventive Screening-Counseling & Management  Alcohol-Tobacco     Alcohol drinks/day: <1     Alcohol type: wine     Smoking Status: quit     Year Quit: 03/27/04  Caffeine-Diet-Exercise     Caffeine use/day: 1 a week     Does Patient Exercise: no  Current Medications (verified): 1)  Claritin 10 Mg  Caps (Loratadine) .... As Needed 2)  Prozac 10 Mg  Caps (Fluoxetine Hcl) .... 2 By Mouth Once Daily 3)  Vitamin D 400 Unit  Tabs (Cholecalciferol) .... Pt Takes 1,000 International Units Daily 4)  Tylenol Extra Strength 500 Mg  Tabs (Acetaminophen) 5)  Prilosec 20 Mg Cpdr (Omeprazole) .Marland Kitchen.. 1 By Mouth Twice  Daily or As Directly 6)  Levothroid 100 Mcg Tabs (Levothyroxine Sodium) .Marland Kitchen.. 1 By Mouth Once Daily  Allergies (verified): No Known Drug Allergies  Past History:  Past medical, surgical, family and social histories (including risk factors) reviewed, and no changes noted  (except as noted below).  Past Medical History: Reviewed history from 04/18/2009 and no changes required. 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 Ulyses Amor  Past Surgical History: Reviewed history from 09/07/2007 and no changes required. Cholecystectomy Tonsillectomy Adenoidectomy  Past History:  Care Management: Orthopedics: unsure Gastroenterology:[facility] Ophthalmology:[facility]   Family History: Reviewed history from 02/25/2010 and no changes required. mom had arthritis OA  Family History of Arthritis- Mother Family History Breast cancer 1st degree relative <50-Mother Family History Diabetes 1st degree relative- Brother 36's  Family History of CAD Female 1st degree relative mom Family History Thyroid disease hearing loss in mom Mom died 61 2 2008/03/27  Social History: Reviewed history from 04/01/2010 and no changes required. Occupation: Chemical engineer Rep  hhof 1   Divorced Former Smoker Alcohol use-yes Regular exercise-no Was caretaker for mom  who died in 10/27/2008 Review of Systems  The patient denies anorexia, fever, weight loss, vision loss, decreased hearing, chest pain, dyspnea on exertion, peripheral edema, melena, hematochezia, severe indigestion/heartburn, hematuria, incontinence, genital sores, muscle weakness, difficulty walking, enlarged lymph nodes, and angioedema.    Physical Exam  General:  Well-developed,well-nourished,in no acute distress; alert,appropriate  and cooperative throughout examination  in mild doiscomfort leaning forward  Head:  normocephalic and atraumatic.   Eyes:  vision grossly intact and pupils equal.   Ears:  R ear normal, L ear normal, and no external deformities.   Neck:  palpble thyroid Lungs:  Normal respiratory effort, chest expands symmetrically. Lungs are clear to auscultation, no crackles or wheezes. Heart:  Normal rate and regular rhythm. S1 and S2 normal without  gallop, murmur, click, rub or other extra sounds. Abdomen:  soft, non-tender, no distention, no masses, no rebound tenderness, no abdominal hernia, no inguinal hernia, no hepatomegaly, and no splenomegaly.   no flank pain  Msk:  no joint swelling and no joint warmth.   no flank pain  tender  lower si area  and worse with raiaing leg . hip ok  gait  slightly bent forward  Pulses:  pulses intact without delay   Neurologic:  non focal  dtrs present Skin:  turgor normal, color normal, no petechiae, and no purpura.   Cervical Nodes:  No lymphadenopathy noted Psych:  Normal eye contact, appropriate affect. Cognition appears normal.    Impression & Recommendations:  Problem # 1:  URINARY FREQUENCY (ICD-788.41) increasing  ? consider uti  Her updated medication list for this problem includes:    Nitrofurantoin Monohyd Macro 100 Mg Caps (Nitrofurantoin monohyd macro) .Marland Kitchen... 1 by mouth two times a day  Orders: UA Dipstick w/o Micro (automated)  (81003) T-Culture, Urine ([id number])  Problem # 2:  BACK PAIN, RIGHT (ICD-724.5) some features   more mechanical some  but with urinary frequency.    consider uti   will do culture  Her updated medication list for this problem includes:    Tylenol Extra Strength 500 Mg Tabs (Acetaminophen)  Problem # 3:  DISORDER, ADJUSTMENT W/DEPRESSED MOOD (ICD-309.0) reported doing well at present  continue meds   for now  Problem # 4:  GERD (ICD-530.81) stable  Her updated medication list for this problem includes:    Prilosec 20 Mg Cpdr (Omeprazole) .Marland Kitchen... 1 by mouth twice  daily or as directly  Complete Medication List: 1)  Claritin 10 Mg Caps (Loratadine) .... As needed 2)  Prozac 10 Mg Caps (Fluoxetine hcl) .... 2 by mouth once daily 3)  Vitamin D 400 Unit Tabs (Cholecalciferol) .... Pt takes 1,000 international units daily 4)  Tylenol Extra Strength 500 Mg Tabs (Acetaminophen) 5)  Prilosec 20 Mg Cpdr (Omeprazole) .Marland Kitchen.. 1 by mouth twice  daily or as  directly 6)  Levothroid 100 Mcg Tabs (Levothyroxine sodium) .Marland Kitchen.. 1 by mouth once daily 7)  Nitrofurantoin Monohyd Macro 100 Mg Caps (Nitrofurantoin monohyd macro) .Marland Kitchen.. 1 by mouth two times a day  Patient Instructions: 1)  this could be mechanical back pain but would treat for infection pending culture test.  2)  Ok to try advil aleve with your prilosec  with caution if needed for pain. Prescriptions: NITROFURANTOIN MONOHYD MACRO 100 MG CAPS (NITROFURANTOIN MONOHYD MACRO) 1 by mouth two times a day  #14 x 0   Entered and Authorized by:   Madelin Headings MD   Signed by:   Madelin Headings MD on 09/16/2010   Method used:   Electronically to        [id number] (retail)       [address]       [place], [place]  [address]       Ph: [phone] or [phone]       Fax:  [phone]   RxID:   [id number]    Orders Added: 1)  UA Dipstick w/o Micro (automated)  [81003] 2)  T-Culture, Urine [[id number]] 3)  Est. Patient Level IV [[address]]    Laboratory Results   Urine Tests    Routine Urinalysis   Color: yellow Appearance: Clear Glucose: negative   (Normal Range: Negative) Bilirubin: negative   (Normal Range: Negative) Ketone: trace (5)   (Normal Range: Negative) Spec. Gravity: 1.010   (Normal Range: 1.003-1.035) Blood: small   (Normal Range: Negative) pH: 5.5   (Normal Range: 5.0-8.0) Protein: negative   (Normal Range: Negative) Urobilinogen: 0.2   (Normal Range: 0-1) Nitrite: negative   (Normal Range: Negative) Leukocyte Esterace: trace   (Normal Range: Negative)    Comments: JALAYSHA SKILTON, CMA (AAMA)  September 16, 2010 10:06 AM     check up in may with full labs

## 2011-03-06 ENCOUNTER — Other Ambulatory Visit: Payer: Self-pay | Admitting: Internal Medicine

## 2011-03-10 ENCOUNTER — Telehealth: Payer: Self-pay | Admitting: *Deleted

## 2011-03-10 MED ORDER — FLUOXETINE HCL 10 MG PO CAPS: 10.0000 mg | ORAL_CAPSULE | Freq: Every day | ORAL | Status: DC

## 2011-03-10 NOTE — Telephone Encounter (Signed)
Refill on fluoxetine 10mg 

## 2011-03-20 ENCOUNTER — Other Ambulatory Visit: Payer: Self-pay | Admitting: Internal Medicine

## 2011-03-25 NOTE — Assessment & Plan Note (Signed)
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 Fabian Sharp for evaluation of abdominal pain.   Ms. Newsham has had recurrent epigastric and right upper quadrant  fullness and vague discomfort for the last several months, partially  relieved by taking Nexium 40 mg a day.  Before all of her problems  began, she had been a rather heavy NSAID user, which she has  discontinued.  She is status post cholecystectomy some 10 years ago.  She has some dyspepsia but denies true reflux symptoms or any solid or  liquid food dysphagia.  She denies clay colored stools, dark urine,  icterus, or fever or chills.  She has not had any lower GI complaints  except for a long history of irritable bowel syndrome with alternating  diarrhea and constipation but no rectal bleeding.  There is a vague  history of colonoscopy in [place] some 4 years ago that was unremarkable.  She did not have endoscopy.  She follows a fairly regular diet and  denies any history of specific food intolerances.   She is being followed by Dr. Fabian Sharp, originally had chronic thyroid  dysfunction diagnosed and started on Synthroid therapy.  She also  suffers from vitamin D deficiency, mood disorder, and mildly borderline  essential hypertension problems.   MEDICATIONS:  1. Prozac 20 mg a day.  2. Vitamin D 50 units a week.  3. Nexium 40 mg a day.  4. L-Thyroxine 75 mcg a day.  5. She uses p.r.n. Claritin-D.   She denies drug allergies.   FAMILY HISTORY:  Remarkable for breast cancer, atherosclerosis, diabetes  but no known gastrointestinal problems.   SOCIAL HISTORY:  She is divorced and lives with her 40 year old mother  who she is taking care of  who places unreasonable demands on the patient  and had lead to some anxiety and depression problems.  The patient does  have some college education but is currently retired from her job in  Airline pilot.  She does not smoke and uses ethanol socially but denies ethanol  abuse.   REVIEW OF SYSTEMS:  Noncontributory in terms of any cardiovascular,  pulmonary, or general medical problems.  She does have some vague  arthralgias for which she was previously taking heavy NSAID use.   PHYSICAL EXAMINATION:  GENERAL:  She is an attractive, healthy-  appearing, white female in no distress, appearing her stated age.  VITAL SIGNS:  She is 5 feet 5 inches tall and weighs 239 pounds.  Blood  pressure is 140/84 and pulse was 72 and regular.  I could not appreciate  stigmata of chronic liver disease.  CHEST:  Clear and she was in a regular rhythm without murmurs, gallops,  or rubs.  ABDOMEN:  Showed no organomegaly, masses, or tenderness.  Bowel sounds  were  normal.  EXTREMITIES:  Peripheral extremities were unremarkable.  MENTAL STATUS:  Clear.  RECTAL:  Deferred.   LABORATORY DATA:  Recent ultrasound on November 23, 2007, by Dr. Gery Pray,  showed her complete upper abdomen to be normal with a previous  cholecystectomy with no evidence of common bile duct enlargement.   ASSESSMENT:  1. Dyspepsia and upper abdominal pain partially relieved by proton      pump inhibitor therapy, rule out Helicobacter pylori infection      versus nonsteroidal anti-inflammatory induced gastric ulceration.  2. Status post cholecystectomy.  3. Obesity and possible mild fatty infiltration of the liver, although      her liver is not enlarged on exam.  4. Vague history of colon polyps from previous colonoscopy.  5. Chronic thyroid dysfunction with recent Synthroid replacement      therapy.  6. History of anxiety and depression.  7. Blood pressure today is 140/84.   RECOMMENDATIONS:  1. Check screening laboratory  parameters.  2. Increase Nexium to 40 mg twice a day.  3. Avoid NSAIDs.  4. Outpatient endoscopy and colonoscopy followup.  5. Continue other medications and follow up with Dr. Fabian Sharp as      previously planned.     Vania Rea. Jarold Motto, MD, Caleen Essex, FAGA  Electronically Signed    DRP/MedQ  DD: 12/30/2007  DT: 12/30/2007  Job #: [id number]   cc:   Neta Mends. Fabian Sharp, MD

## 2011-04-02 ENCOUNTER — Other Ambulatory Visit (INDEPENDENT_AMBULATORY_CARE_PROVIDER_SITE_OTHER): Payer: BC Managed Care – PPO

## 2011-04-02 ENCOUNTER — Other Ambulatory Visit: Payer: Self-pay

## 2011-04-02 DIAGNOSIS — Z0000 Encounter for general adult medical examination without abnormal findings: Secondary | ICD-10-CM

## 2011-04-02 LAB — BASIC METABOLIC PANEL
Chloride: 105 mEq/L (ref 96–112)
Potassium: 4.8 mEq/L (ref 3.5–5.1)
Sodium: 142 mEq/L (ref 135–145)

## 2011-04-02 LAB — CBC WITH DIFFERENTIAL/PLATELET
Basophils Relative: 0.3 % (ref 0.0–3.0)
Eosinophils Relative: 4.8 % (ref 0.0–5.0)
HCT: 39.8 % (ref 36.0–46.0)
Hemoglobin: 13.4 g/dL (ref 12.0–15.0)
Lymphs Abs: 1.5 10*3/uL (ref 0.7–4.0)
MCV: 92.4 fl (ref 78.0–100.0)
Monocytes Absolute: 0.4 10*3/uL (ref 0.1–1.0)
Monocytes Relative: 7.6 % (ref 3.0–12.0)
RBC: 4.3 Mil/uL (ref 3.87–5.11)
WBC: 5.2 10*3/uL (ref 4.5–10.5)

## 2011-04-02 LAB — HEPATIC FUNCTION PANEL
ALT: 13 U/L (ref 0–35)
AST: 19 U/L (ref 0–37)
Alkaline Phosphatase: 60 U/L (ref 39–117)
Bilirubin, Direct: 0.2 mg/dL (ref 0.0–0.3)
Total Bilirubin: 1.4 mg/dL — ABNORMAL HIGH (ref 0.3–1.2)

## 2011-04-02 LAB — URINALYSIS, ROUTINE W REFLEX MICROSCOPIC
Bilirubin Urine: NEGATIVE
Total Protein, Urine: NEGATIVE
Urine Glucose: NEGATIVE
pH: 6 (ref 5.0–8.0)

## 2011-04-02 LAB — LIPID PANEL
Cholesterol: 164 mg/dL (ref 0–200)
LDL Cholesterol: 100 mg/dL — ABNORMAL HIGH (ref 0–99)
Total CHOL/HDL Ratio: 3

## 2011-04-09 ENCOUNTER — Ambulatory Visit (INDEPENDENT_AMBULATORY_CARE_PROVIDER_SITE_OTHER): Payer: BC Managed Care – PPO | Admitting: Internal Medicine

## 2011-04-09 ENCOUNTER — Telehealth: Payer: Self-pay | Admitting: *Deleted

## 2011-04-09 ENCOUNTER — Ambulatory Visit (INDEPENDENT_AMBULATORY_CARE_PROVIDER_SITE_OTHER)
Admission: RE | Admit: 2011-04-09 | Discharge: 2011-04-09 | Disposition: A | Discharge status: Home / Self Care | Payer: BC Managed Care – PPO | Source: Ambulatory Visit | Attending: Internal Medicine | Admitting: Internal Medicine

## 2011-04-09 ENCOUNTER — Encounter: Payer: Self-pay | Admitting: Internal Medicine

## 2011-04-09 VITALS — BP 126/86 | HR 60 | Temp 98.6°F | Wt 212.0 lb

## 2011-04-09 DIAGNOSIS — R143 Flatulence: Secondary | ICD-10-CM

## 2011-04-09 DIAGNOSIS — R140 Abdominal distension (gaseous): Secondary | ICD-10-CM

## 2011-04-09 DIAGNOSIS — R141 Gas pain: Secondary | ICD-10-CM

## 2011-04-09 DIAGNOSIS — K219 Gastro-esophageal reflux disease without esophagitis: Secondary | ICD-10-CM

## 2011-04-09 DIAGNOSIS — K5730 Diverticulosis of large intestine without perforation or abscess without bleeding: Secondary | ICD-10-CM

## 2011-04-09 NOTE — Telephone Encounter (Signed)
Message copied by Romualdo Bolk on Wed Apr 09, 2011  2:48 PM ------      Message from: [facility], [place] K      Created: Wed Apr 09, 2011  2:40 PM       Tell patient no acute  findings on the x ray .

## 2011-04-09 NOTE — Telephone Encounter (Signed)
Pt aware of results 

## 2011-04-09 NOTE — Patient Instructions (Signed)
Unsure why you have change in bowel habits.  This still could be IBS.  But unsure of this.  Get plain x ray as  Discussed . And then  We should get Dr Jarold Motto to see you again .   Can try beano at this time.  In the meantime. Call if fever vomiting .  Can increase  prilosec twice a day temporarily.

## 2011-04-09 NOTE — Telephone Encounter (Signed)
Left message on machine about results. 

## 2011-04-09 NOTE — Progress Notes (Signed)
Subjective:    Patient ID: Shelby Wong, female    DOB: 03-13-1951, 60 y.o.   MRN: [id number]  HPI Patient comes in for an acute SDA appointment today because of the above concerns. She's had some baseline stomach symptoms for while with her history of GERD and diverticulosis however things are worse over the last number of days. Increasing abdominal bloating Bloating  A lot and more gas that she states that she cannot pass and the feeling of" fire in chest"  like reflux  .  Better  In am and  Worse after eating.  Worse   Recently and usure if something else going on. Avoids certain foods hard to digest such as apples and carrots.Sharyne Richters but hasn't seen him in a while.  On prilosec. 4 reflux. Review of Systems No uti  Sx  .   No shortness of breath coughing vomiting blood in her stool although she states she's had hemorrhoids when she wiped she had some blood. No melena or hematochezia. Rest of ROS negative no fever  chills but her upper abdomen feels hot at times. Usual new rashes  Or meds Past Medical History  Diagnosis Date  . Allergic rhinitis   . Depression   . GERD (gastroesophageal reflux disease)     hital hernia  . Headache    Past Surgical History  Procedure Date  . Cholecystectomy   . Tonsillectomy and adenoidectomy     reports that she has quit smoking. She does not have any smokeless tobacco history on file. She reports that she drinks alcohol. Her drug history not on file. family history includes Arthritis in her mother; Breast cancer in her mother; Coronary artery disease in her mother; Diabetes in her brother; Other in her mother; and Thyroid disease in her mother. No Known Allergies      Objective:   Physical Exam Physical Exam: Vital signs reviewed WUJ:WJXB is a well-developed well-nourished alert cooperative  white female who appears her stated age in no acute distress. But looks mildly uncomfortable. HEENT: normocephalic  traumatic , Eyes: PERRL EOM's  full, conjunctiva clear, Nares: paten,t no deformity discharge or tenderness., Ears: no deformity EAC's clear TMs with normal landmarks. Mouth: clear OP, no lesions, edema.  Moist mucous membranes. Dentition in adequate repair. NECK: supple without masses, thyromegaly or bruits. CHEST/PULM:  Clear to auscultation and percussion breath sounds equal no wheeze , rales or rhonchi. No chest wall deformities or tenderness. CV: PMI is nondisplaced, S1 S2 no gallops, murmurs, rubs. Peripheral pulses are full without delay.No JVD .  ABDOMEN: nontender  No guard or rebound, no hepato splenomegal no CVA tenderness.  No hernia. I don't really hear that many bowel sounds NEURO:  Oriented x3, cranial nerves 3-12 appear to be intact, no obvious focal weakness, SKIN: No acute rashes normal turgor, color, no bruising or petechiae. PSYCH: Oriented, good eye contact, no obvious depression anxiety, cognition and judgment appear normal.      Laboratory studies for her CPX are basically normal.  Assessment & Plan:  Bloating and change in bowel habits. GERD sx ? Worse  No history of lactose intolerance or gluten disease that I am aware of. Unsure why this is happening although she did state that she ate more than the recent past. She states that usually she gets diarrhea with her IBS not the situation. We will get a flat plate upright to rule out abnormal gas pattern have her increase her Prilosec and then arrange  for her to see Dr. Norval Gable office for further workup and advice for intervention.  She will return for her checkup in a couple weeks.

## 2011-04-11 ENCOUNTER — Encounter: Payer: Self-pay | Admitting: Gastroenterology

## 2011-04-17 ENCOUNTER — Telehealth: Payer: Self-pay | Admitting: Internal Medicine

## 2011-04-17 NOTE — Telephone Encounter (Signed)
Pt is going to come by today to bring some fmla paperwork by for Dr Fabian Sharp to complete and sign. Pts work is req this to be done due to GI problems. Pt needs this paperwork done before 04/30/11. Pls notify pt when this is ready to pick up.

## 2011-04-17 NOTE — Telephone Encounter (Signed)
Need to ask her dates she has been out of work.

## 2011-04-17 NOTE — Telephone Encounter (Signed)
Left message on machine to have her put the dates of when she has been out of work either on the form or separate sheet of paper.

## 2011-04-18 NOTE — Progress Notes (Signed)
  Subjective:    Patient ID: Shelby Wong, female    DOB: 1950/12/04, 60 y.o.   MRN: [id number]  HPI    Review of Systems     Objective:   Physical Exam        Assessment & Plan:  FMLA form completed   Today for patient abscence from work  5/29- 6 /1 2012 [facility]  MD

## 2011-04-30 ENCOUNTER — Ambulatory Visit (INDEPENDENT_AMBULATORY_CARE_PROVIDER_SITE_OTHER): Payer: BC Managed Care – PPO | Admitting: Internal Medicine

## 2011-04-30 ENCOUNTER — Other Ambulatory Visit: Payer: Self-pay | Admitting: Internal Medicine

## 2011-04-30 ENCOUNTER — Encounter: Payer: Self-pay | Admitting: Internal Medicine

## 2011-04-30 VITALS — BP 110/80 | HR 72 | Ht 64.0 in | Wt 214.0 lb

## 2011-04-30 DIAGNOSIS — E782 Mixed hyperlipidemia: Secondary | ICD-10-CM

## 2011-04-30 DIAGNOSIS — E039 Hypothyroidism, unspecified: Secondary | ICD-10-CM

## 2011-04-30 DIAGNOSIS — Z0000 Encounter for general adult medical examination without abnormal findings: Secondary | ICD-10-CM

## 2011-04-30 DIAGNOSIS — F329 Major depressive disorder, single episode, unspecified: Secondary | ICD-10-CM

## 2011-04-30 DIAGNOSIS — K449 Diaphragmatic hernia without obstruction or gangrene: Secondary | ICD-10-CM

## 2011-04-30 DIAGNOSIS — E669 Obesity, unspecified: Secondary | ICD-10-CM

## 2011-04-30 DIAGNOSIS — K219 Gastro-esophageal reflux disease without esophagitis: Secondary | ICD-10-CM

## 2011-04-30 NOTE — Patient Instructions (Signed)
Get a mammogram. Ask Dr Jarold Motto about staying on  Higher dose of prilosec . Weight loss may help    The hiatal hernia .  Same dose of thyroid. Check up in a year.

## 2011-04-30 NOTE — Progress Notes (Signed)
  Subjective:    Patient ID: Shelby Wong, female    DOB: Mar 02, 1951, 60 y.o.   MRN: [id number]  HPI Patient comes in today for preventive health visit. Since her last visit her stomach situation has improved. She is now taking the Prilosec twice a day. Thinks the gi is HH    her symptoms of bloating and right upper quadrant discomfort comes and goes.   Still some bowel urgency. No blood in her stool no fever . Hasn't been eating as healthy recently  Review of Systems 12 system review except as above.   Some bloating.   No major changes hearing vision breathing no bleeding unusual lumps or skin changes. She is still on the fluoxetine but down to 10 mg a day.  Consider coming off of this. No change in her thyroid medication.  Past history family history social history reviewed in the electronic medical record.     Objective:   Physical Exam Physical Exam: Vital signs reviewed WUJ:WJXB is a well-developed well-nourished alert cooperative  white female who appears her stated age in no acute distress.  HEENT: normocephalic  traumatic , Eyes: PERRL EOM's full, conjunctiva clear, Nares: paten,t no deformity discharge or tenderness., Ears: no deformity EAC's clear TMs with normal landmarks. Mouth: clear OP, no lesions, edema.  Moist mucous membranes. Dentition in adequate repair. NECK: supple without masses, thyromegaly or bruits. Breast: normal by inspection . No dimpling, discharge, masses, tenderness or discharge . LN: no cervical axillary inguinal adenopathy CHEST/PULM:  Clear to auscultation and percussion breath sounds equal no wheeze , rales or rhonchi. No chest wall deformities or tenderness. CV: PMI is nondisplaced, S1 S2 no gallops, murmurs, rubs. Peripheral pulses are full without delay.No JVD .  ABDOMEN: Bowel sounds normal nontender  No guard or rebound, no hepato splenomegal no CVA tenderness.  No hernia. Extremtities:  No clubbing cyanosis or edema, no acute joint swelling or redness no  focal atrophy  Some VV no ulcers  NEURO:  Oriented x3, cranial nerves 3-12 appear to be intact, no obvious focal weakness,gait within normal limits no abnormal reflexes or asymmetrical SKIN: No acute rashes normal turgor, color, no bruising or petechiae.  botton of left foot mid metatarsal callus  No lesion and food nl nv . PSYCH: Oriented, good eye contact, no obvious depression anxiety, cognition and judgment appear normal.  Oriented x 3 and no noted deficits in memory, attention, and speech. Laboratory studies reviewed.     Assessment & Plan:  Preventive Health Care Counseled regarding healthy nutrition, exercise, sleep, injury prevention, calcium vit d and healthy weight .  She is way overdue for a mammogram she should get this done.  GI distress and bloating    Some better  Seems atypical for [facility] and gerd but is better i on higher dose PPI .  She is avoiding many types of foods and is unsure how to manage this because of her history of diverticulosis and reflux. I did discuss with her that weight loss and healthy manner will help all of her symptoms.  Depression; this is much better and she can consider weaning off of the Prozac. Hypothyroid: No change in her medication. Allergy: Stable at this point.

## 2011-05-03 DIAGNOSIS — Z0000 Encounter for general adult medical examination without abnormal findings: Secondary | ICD-10-CM | POA: Insufficient documentation

## 2011-05-08 ENCOUNTER — Encounter: Payer: Self-pay | Admitting: *Deleted

## 2011-05-19 ENCOUNTER — Ambulatory Visit (INDEPENDENT_AMBULATORY_CARE_PROVIDER_SITE_OTHER): Payer: BC Managed Care – PPO | Admitting: Gastroenterology

## 2011-05-19 ENCOUNTER — Other Ambulatory Visit (INDEPENDENT_AMBULATORY_CARE_PROVIDER_SITE_OTHER): Payer: BC Managed Care – PPO

## 2011-05-19 ENCOUNTER — Encounter: Payer: Self-pay | Admitting: Gastroenterology

## 2011-05-19 VITALS — BP 134/76 | HR 68 | Ht 64.0 in | Wt 213.0 lb

## 2011-05-19 DIAGNOSIS — K219 Gastro-esophageal reflux disease without esophagitis: Secondary | ICD-10-CM

## 2011-05-19 DIAGNOSIS — K589 Irritable bowel syndrome without diarrhea: Secondary | ICD-10-CM | POA: Insufficient documentation

## 2011-05-19 DIAGNOSIS — R1011 Right upper quadrant pain: Secondary | ICD-10-CM | POA: Insufficient documentation

## 2011-05-19 LAB — IBC PANEL
Iron: 85 ug/dL (ref 42–145)
Saturation Ratios: 27.2 % (ref 20.0–50.0)

## 2011-05-19 LAB — CBC WITH DIFFERENTIAL/PLATELET
Basophils Absolute: 0 10*3/uL (ref 0.0–0.1)
Lymphocytes Relative: 38.2 % (ref 12.0–46.0)
Monocytes Relative: 7.2 % (ref 3.0–12.0)
Neutrophils Relative %: 50.8 % (ref 43.0–77.0)
Platelets: 199 10*3/uL (ref 150.0–400.0)
RDW: 13.6 % (ref 11.5–14.6)

## 2011-05-19 LAB — BASIC METABOLIC PANEL
BUN: 19 mg/dL (ref 6–23)
Chloride: 103 mEq/L (ref 96–112)
Creatinine, Ser: 1 mg/dL (ref 0.4–1.2)
Glucose, Bld: 96 mg/dL (ref 70–99)
Potassium: 4.5 mEq/L (ref 3.5–5.1)

## 2011-05-19 LAB — HEPATIC FUNCTION PANEL
AST: 16 U/L (ref 0–37)
Albumin: 4.1 g/dL (ref 3.5–5.2)
Alkaline Phosphatase: 60 U/L (ref 39–117)
Total Protein: 7.1 g/dL (ref 6.0–8.3)

## 2011-05-19 MED ORDER — ESOMEPRAZOLE MAGNESIUM 40 MG PO CPDR: 40.0000 mg | DELAYED_RELEASE_CAPSULE | Freq: Every day | ORAL | Status: DC

## 2011-05-19 NOTE — Patient Instructions (Signed)
We have sent Nexium to your pharmacy please take one tablet a day 30 min before breakfast, samples also given. Please go to the basement today for your labs.

## 2011-05-19 NOTE — Progress Notes (Signed)
This is a 60 year old Caucasian female with known diverticulosis, IBS, and acid reflux. She recently has had exacerbation of her gas and bloating, acid reflux symptoms, and crampy lower abdominal pain. She seems to have improved greatly with doubling of her Prilosec dosage from 20-40 mg a day. Previous endoscopic exam showed evidence of a large hiatal hernia. 2 status post cholecystectomy. She has chronic abdominal gas and bloating with mild constipation. She is up-to-date on her colonoscopy and laboratory exams. I can get no history of dysphagia, hepatobiliary complaints, melena or hematochezia family history is noncontributory. She is on regular vitamin D. and calcium supplementation.  Current Medications, Allergies, Past Medical History, Past Surgical History, Family History and Social History were reviewed in [employer] record.  Pertinent Review of Systems Negative  Past Medical History  Diagnosis Date  . Allergic rhinitis   . Depression   . GERD (gastroesophageal reflux disease)   . Headache   . Hiatal hernia   . Diverticulosis of colon (without mention of hemorrhage)   . Thyroid disease   . IBS (irritable bowel syndrome)   . Anxiety   . Hemorrhoids    Past Surgical History  Procedure Date  . Cholecystectomy   . Tonsillectomy and adenoidectomy     reports that she has quit smoking. She has never used smokeless tobacco. She reports that she does not drink alcohol or use illicit drugs. family history includes Arthritis in her mother; Breast cancer in her mother; Coronary artery disease in her mother; Diabetes in her brother; Other in her mother; and Thyroid disease in her mother.  There is no history of Colon cancer. No Known Allergies    Physical Exam: Awake and alert no acute distress. She appears her stated age. I cannot appreciate stigmata of chronic liver disease. Chest is clear she appears a regular rhythm without murmurs gallops or rubs. There is no  hepatosplenomegaly, abdominal masses or tenderness. Bowel sounds are normal. Extremities are unremarkable mental status is normal.    Assessment and Plan: chronic GERD with a recent exacerbation of unexplained etiology. Her pain is mostly in the right upper quadrant area, and I have ordered liver function tests for review. I have changed her to Nexium 40 mg every morning and twice a day as needed with standard anti-reflux maneuvers. She seems to be doing well with her IBS. I did order a celiac panel for review. Encounter Diagnosis  Name Primary?  . Esophageal reflux Yes

## 2011-05-20 LAB — CELIAC PANEL 10
Endomysial Screen: NEGATIVE
Gliadin IgA: 28.8 U/mL — ABNORMAL HIGH (ref <20)
Gliadin IgG: 10.1 U/mL (ref <20)
IgA: 182 mg/dL (ref 69–380)

## 2011-05-22 ENCOUNTER — Telehealth: Payer: Self-pay | Admitting: *Deleted

## 2011-05-22 NOTE — Telephone Encounter (Signed)
LABS C/W GLUTEN SENSITIVITY.Marland KitchenNEEDS GLUTEN FREE DIET TRIAL FOR 6 WEEKS..per DRP

## 2011-05-22 NOTE — Telephone Encounter (Signed)
Message copied by Leonette Monarch on Thu May 22, 2011  4:40 PM ------      Message from: Jarold Motto, DAVID R      Created: Tue May 20, 2011 11:58 AM       NEEDS B12 SHOTS.Marland Kitchen

## 2011-05-26 NOTE — Telephone Encounter (Signed)
Left message on machine to call back if questions, I have explained the lab results and mailed gluten free diet.

## 2011-06-29 ENCOUNTER — Other Ambulatory Visit: Payer: Self-pay | Admitting: Internal Medicine

## 2011-07-06 ENCOUNTER — Other Ambulatory Visit: Payer: Self-pay | Admitting: Internal Medicine

## 2011-08-14 ENCOUNTER — Other Ambulatory Visit: Payer: Self-pay | Admitting: Internal Medicine

## 2011-09-08 ENCOUNTER — Other Ambulatory Visit: Payer: Self-pay | Admitting: Internal Medicine

## 2011-10-20 ENCOUNTER — Other Ambulatory Visit: Payer: Self-pay | Admitting: Internal Medicine

## 2011-11-05 ENCOUNTER — Other Ambulatory Visit: Payer: Self-pay | Admitting: Internal Medicine

## 2011-11-20 ENCOUNTER — Encounter: Payer: Self-pay | Admitting: Family

## 2011-11-20 ENCOUNTER — Ambulatory Visit (INDEPENDENT_AMBULATORY_CARE_PROVIDER_SITE_OTHER): Payer: BC Managed Care – PPO | Admitting: Family

## 2011-11-20 VITALS — BP 140/84 | Temp 98.0°F | Wt 230.0 lb

## 2011-11-20 DIAGNOSIS — J019 Acute sinusitis, unspecified: Secondary | ICD-10-CM

## 2011-11-20 DIAGNOSIS — R05 Cough: Secondary | ICD-10-CM

## 2011-11-20 MED ORDER — AMOXICILLIN 500 MG PO TABS: 1000.0000 mg | ORAL_TABLET | Freq: Two times a day (BID) | ORAL | Status: AC

## 2011-11-20 NOTE — Progress Notes (Signed)
Subjective:    Patient ID: Shelby Wong, female    DOB: [birth year], 61 y.o.   MRN: [id number]  HPI 61 year old WF, patient of Dr. Fabian Sharp, with complaints of congestion, ear pain, cough x 1 week and worsening. She has been taking Claritin-D with some relief. She denies SOB, chest pain, nausea, vomiting, or edema.    Review of Systems  Constitutional: Negative.   HENT: Positive for congestion, sneezing, postnasal drip and sinus pressure.   Eyes: Negative.   Respiratory: Negative.   Cardiovascular: Negative.   Neurological: Negative.   Hematological: Negative.   Psychiatric/Behavioral: Negative.    Past Medical History  Diagnosis Date  . Allergic rhinitis   . Depression   . GERD (gastroesophageal reflux disease)   . Headache   . Hiatal hernia   . Diverticulosis of colon (without mention of hemorrhage)   . Thyroid disease   . IBS (irritable bowel syndrome)   . Anxiety   . Hemorrhoids     History   Social History  . Marital Status: Divorced    Spouse Name: N/A    Number of Children: 1  . Years of Education: N/A   Occupational History  . Direct Sales Rep    Social History Main Topics  . Smoking status: Former Games developer  . Smokeless tobacco: Never Used  . Alcohol Use: No  . Drug Use: No  . Sexually Active: Not on file   Other Topics Concern  . Not on file   Social History Narrative   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   40 hours per week.HH of 1DivorcedRegular exercise- noWas caretaker for mom who died in 2010/01/20G1P10 Caffeine drinks daily     Past Surgical History  Procedure Date  . Cholecystectomy   . Tonsillectomy and adenoidectomy     Family History  Problem Relation Age of Onset  . Arthritis Mother     osteo  . Breast cancer Mother   . Coronary artery disease Mother   . Other Mother     hearing loss  . Diabetes Brother     50 s  . Thyroid disease Mother   . Colon cancer Neg Hx     No Known Allergies  Current Outpatient  Prescriptions on File Prior to Visit  Medication Sig Dispense Refill  . Acetaminophen (TYLENOL EXTRA STRENGTH PO) Take by mouth.        . Cholecalciferol (VITAMIN D) 400 UNITS capsule Take 400 Units by mouth daily.        . cyclobenzaprine (FLEXERIL) 10 MG tablet Take 10 mg by mouth. As needed      . esomeprazole (NEXIUM) 40 MG capsule Take 1 capsule (40 mg total) by mouth daily.  30 capsule  11  . FLUoxetine (PROZAC) 10 MG capsule TAKE ONE CAPSULE BY MOUTH EVERY DAY  30 capsule  1  . levothyroxine (SYNTHROID, LEVOTHROID) 100 MCG tablet TAKE 1 TABLET BY MOUTH EVERY DAY  30 tablet  4  . Loratadine (CLARITIN) 10 MG CAPS Take by mouth. As needed      . omeprazole (PRILOSEC) 20 MG capsule TAKE 1 CAPSULE BY MOUTH TWICE A DAY OR AS DIRECTED  60 capsule  1    BP 140/84  Temp(Src) 98 F (36.7 C) (Oral)  Wt 230 lb (104.327 kg)chart    Objective:   Physical Exam  Constitutional: She is oriented to person, place, and time. She appears well-developed and well-nourished.  HENT:  Right Ear: External ear normal.  Left Ear: External ear normal.  Nose: Nose normal.  Mouth/Throat: Oropharynx is clear and moist.  Cardiovascular: Normal rate, regular rhythm and normal heart sounds.   Pulmonary/Chest: Effort normal and breath sounds normal.  Musculoskeletal: Normal range of motion.  Neurological: She is alert and oriented to person, place, and time.  Skin: Skin is warm and dry.  Psychiatric: She has a normal mood and affect.          Assessment & Plan:  Assessment:  Acute Sinusitis, Cough  Plan: Continue Claritin D twice daily. Amoxil 500mg  2 po bid x 10 days. Call the office if symptoms worsen or persist. Recheck as needed and scheduled.

## 2011-11-20 NOTE — Patient Instructions (Signed)

## 2011-11-25 ENCOUNTER — Encounter: Payer: Self-pay | Admitting: Family

## 2011-11-25 ENCOUNTER — Ambulatory Visit (INDEPENDENT_AMBULATORY_CARE_PROVIDER_SITE_OTHER): Payer: BC Managed Care – PPO | Admitting: Family

## 2011-11-25 VITALS — HR 96 | Temp 98.3°F

## 2011-11-25 DIAGNOSIS — J019 Acute sinusitis, unspecified: Secondary | ICD-10-CM

## 2011-11-25 DIAGNOSIS — R51 Headache: Secondary | ICD-10-CM

## 2011-11-25 MED ORDER — PREDNISONE 20 MG PO TABS: 60.0000 mg | ORAL_TABLET | Freq: Every day | ORAL | Status: AC

## 2011-11-25 NOTE — Progress Notes (Signed)
Subjective:    Patient ID: Shelby Wong, female    DOB: 1951-09-16, 61 y.o.   MRN: [id number]  HPI 61 year old white female, nonsmoker, patient of Dr. Fabian Sharp is in today with a persistent sinusitis. She is on day 5 of her amoxicillin as she's been taken thousand milligrams twice daily. Continues to report sinus pressure on the right side of her face that has improved but is still very uncomfortable. She also has tenderness in the back of her head. Patient denies any increase in sneezing, cough, or congestion.    Review of Systems  Constitutional: Negative.   HENT: Positive for congestion, rhinorrhea and sinus pressure.   Eyes: Negative.   Respiratory: Negative.   Cardiovascular: Negative.   Genitourinary: Negative.   Musculoskeletal: Negative.   Skin: Negative.   Neurological: Positive for headaches.  Hematological: Negative.   Psychiatric/Behavioral: Negative.    Past Medical History  Diagnosis Date  . Allergic rhinitis   . Depression   . GERD (gastroesophageal reflux disease)   . Headache   . Hiatal hernia   . Diverticulosis of colon (without mention of hemorrhage)   . Thyroid disease   . IBS (irritable bowel syndrome)   . Anxiety   . Hemorrhoids     History   Social History  . Marital Status: Divorced    Spouse Name: N/A    Number of Children: 1  . Years of Education: N/A   Occupational History  . Direct Sales Rep    Social History Main Topics  . Smoking status: Former Games developer  . Smokeless tobacco: Never Used  . Alcohol Use: No  . Drug Use: No  . Sexually Active: Not on file   Other Topics Concern  . 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 Cholecystectomy   . Tonsillectomy and adenoidectomy     Family History  Problem Relation Age of  Onset  . Arthritis Mother     osteo  . Breast cancer Mother   . Coronary artery disease Mother   . Other Mother     hearing loss  . Diabetes Brother     50 s  . Thyroid disease Mother   . Colon cancer Neg Hx     No Known Allergies  Current Outpatient Prescriptions on File Prior to Visit  Medication Sig Dispense Refill  . Acetaminophen (TYLENOL EXTRA STRENGTH PO) Take by mouth.        Marland Kitchen amoxicillin (AMOXIL) 500 MG tablet Take 2 tablets (1,000 mg total) by mouth 2 (two) times daily.  40 tablet  0  . Cholecalciferol (VITAMIN D) 400 UNITS capsule Take 400 Units by mouth daily.        . cyclobenzaprine (FLEXERIL) 10 MG tablet Take 10 mg by mouth. As needed      . esomeprazole (NEXIUM) 40 MG capsule Take 1 capsule (40 mg total) by mouth daily.  30 capsule  11  . FLUoxetine (PROZAC) 10 MG capsule TAKE ONE CAPSULE BY MOUTH EVERY DAY  30 capsule  1  . levothyroxine (SYNTHROID, LEVOTHROID) 100 MCG tablet TAKE 1 TABLET BY MOUTH EVERY DAY  30 tablet  4  . Loratadine (CLARITIN) 10 MG CAPS Take by mouth. As needed      . omeprazole (PRILOSEC)  20 MG capsule TAKE 1 CAPSULE BY MOUTH TWICE A DAY OR AS DIRECTED  60 capsule  1    Pulse 96  Temp(Src) 98.3 F (36.8 C) (Oral)  SpO2 98%chart   Objective:   Physical Exam  Constitutional: She is oriented to person, place, and time. She appears well-developed and well-nourished.  HENT:  Right Ear: External ear normal.  Left Ear: External ear normal.  Nose: Nose normal.  Mouth/Throat: Oropharynx is clear and moist.  Neck: Neck supple.  Cardiovascular: Normal rate, regular rhythm and normal heart sounds.   Pulmonary/Chest: Effort normal and breath sounds normal.  Musculoskeletal: Normal range of motion.  Neurological: She is alert and oriented to person, place, and time.  Skin: Skin is warm and dry.  Psychiatric: She has a normal mood and affect.          Assessment & Plan:  Assessment: Acute sinusitis, headache   Plan: Continue  amoxicillin. Prednisone 60 mg by mouth every morning x5 days. Drink plenty of fluids. Rest. Call the office symptoms worsen or persist. Recheck as scheduled, and when necessary.

## 2011-12-08 ENCOUNTER — Telehealth: Payer: Self-pay | Admitting: *Deleted

## 2011-12-08 MED ORDER — MAGIC MOUTHWASH W/LIDOCAINE: 5.0000 mL | Freq: Four times a day (QID) | ORAL | Status: DC

## 2011-12-08 MED ORDER — FLUCONAZOLE 150 MG PO TABS: 150.0000 mg | ORAL_TABLET | Freq: Once | ORAL | Status: AC

## 2011-12-08 NOTE — Telephone Encounter (Signed)
Diflucan 150mg  1 po x 1. Miracle mouthwash gargle, swish and swallow QID prn #120 ml no refill

## 2011-12-08 NOTE — Telephone Encounter (Signed)
Pt is having problems with vaginal yeast and thrush. [facility]

## 2011-12-12 ENCOUNTER — Other Ambulatory Visit: Payer: Self-pay

## 2011-12-12 ENCOUNTER — Encounter (HOSPITAL_COMMUNITY): Payer: Self-pay

## 2011-12-12 ENCOUNTER — Emergency Department (HOSPITAL_COMMUNITY): Payer: BC Managed Care – PPO

## 2011-12-12 ENCOUNTER — Inpatient Hospital Stay (HOSPITAL_COMMUNITY)
Admission: EM | Admit: 2011-12-12 | Discharge: 2011-12-17 | DRG: 122 | Disposition: A | Discharge status: Home / Self Care | Payer: BC Managed Care – PPO | Attending: Cardiology | Admitting: Cardiology

## 2011-12-12 DIAGNOSIS — E559 Vitamin D deficiency, unspecified: Secondary | ICD-10-CM | POA: Diagnosis present

## 2011-12-12 DIAGNOSIS — K589 Irritable bowel syndrome without diarrhea: Secondary | ICD-10-CM | POA: Diagnosis present

## 2011-12-12 DIAGNOSIS — E039 Hypothyroidism, unspecified: Secondary | ICD-10-CM | POA: Diagnosis present

## 2011-12-12 DIAGNOSIS — Z87891 Personal history of nicotine dependence: Secondary | ICD-10-CM

## 2011-12-12 DIAGNOSIS — J309 Allergic rhinitis, unspecified: Secondary | ICD-10-CM | POA: Diagnosis present

## 2011-12-12 DIAGNOSIS — Z8619 Personal history of other infectious and parasitic diseases: Secondary | ICD-10-CM

## 2011-12-12 DIAGNOSIS — Z8249 Family history of ischemic heart disease and other diseases of the circulatory system: Secondary | ICD-10-CM

## 2011-12-12 DIAGNOSIS — F411 Generalized anxiety disorder: Secondary | ICD-10-CM | POA: Diagnosis present

## 2011-12-12 DIAGNOSIS — K219 Gastro-esophageal reflux disease without esophagitis: Secondary | ICD-10-CM | POA: Diagnosis present

## 2011-12-12 DIAGNOSIS — I214 Non-ST elevation (NSTEMI) myocardial infarction: Principal | ICD-10-CM | POA: Diagnosis present

## 2011-12-12 DIAGNOSIS — E782 Mixed hyperlipidemia: Secondary | ICD-10-CM | POA: Diagnosis present

## 2011-12-12 DIAGNOSIS — E785 Hyperlipidemia, unspecified: Secondary | ICD-10-CM | POA: Diagnosis present

## 2011-12-12 DIAGNOSIS — E876 Hypokalemia: Secondary | ICD-10-CM | POA: Diagnosis present

## 2011-12-12 DIAGNOSIS — E669 Obesity, unspecified: Secondary | ICD-10-CM | POA: Diagnosis present

## 2011-12-12 DIAGNOSIS — R7309 Other abnormal glucose: Secondary | ICD-10-CM | POA: Diagnosis present

## 2011-12-12 LAB — CBC
MCHC: 33.8 g/dL (ref 30.0–36.0)
Platelets: 198 10*3/uL (ref 150–400)
RDW: 13.2 % (ref 11.5–15.5)

## 2011-12-12 LAB — POCT I-STAT TROPONIN I: Troponin i, poc: 0.18 ng/mL (ref 0.00–0.08)

## 2011-12-12 MED ORDER — MORPHINE SULFATE 4 MG/ML IJ SOLN
4.0000 mg | Freq: Once | INTRAMUSCULAR | Status: AC
  Administered 2011-12-13: 4 mg via INTRAVENOUS
  Filled 2011-12-12: qty 1

## 2011-12-12 NOTE — ED Provider Notes (Signed)
History     CSN: [id number]  Arrival date & time 12/12/11  2141   First MD Initiated Contact with Patient 12/12/11 2207      Chief Complaint  Patient presents with  . Chest Pain    Started yesterday, worse today, has been taking TUMS that has not helped, had 342 ASA, nitro x2, #20g Rt hand, BP was 170/110, currently 4/10    (Consider location/radiation/quality/duration/timing/severity/associated sxs/prior treatment) HPI History provided by pt.   Pt has had intermittent, left-sided chest pressure since yesterday morning.  Radiates to back and associated w/ aching in LUE as well as nausea this evening.  Denies SOB and diaphoresis.  Denies cough and fever.  No abd pain.  No recent trauma or heavy lifting.  H/o acid reflux and this feels different.  Pt healthy, does not smoke cigarettes and no FH of early MI.  No h/o DVT/PE and denies LE pain/edema.     Past Medical History  Diagnosis Date  . Allergic rhinitis   . Depression   . GERD (gastroesophageal reflux disease)   . Headache   . Hiatal hernia   . Diverticulosis of colon (without mention of hemorrhage)   . Thyroid disease   . IBS (irritable bowel syndrome)   . Anxiety   . Hemorrhoids     Past Surgical History  Procedure Date  . Cholecystectomy   . Tonsillectomy and adenoidectomy     Family History  Problem Relation Age of Onset  . Arthritis Mother     osteo  . Breast cancer Mother   . Coronary artery disease Mother   . Other Mother     hearing loss  . Diabetes Brother     50 s  . Thyroid disease Mother   . Colon cancer Neg Hx     History  Substance Use Topics  . Smoking status: Former Games developer  . Smokeless tobacco: Never Used  . Alcohol Use: No    OB History    Grav Para Term Preterm Abortions TAB SAB Ect Mult Living                  Review of Systems  All other systems reviewed and are negative.    Allergies  Review of patient's allergies indicates no known allergies.  Home Medications    Current Outpatient Rx  Name Route Sig Dispense Refill  . TYLENOL EXTRA STRENGTH PO Oral Take 1 tablet by mouth daily as needed. For pain    . MAGIC MOUTHWASH W/LIDOCAINE Oral Take 5 mLs by mouth 4 (four) times daily. 120 mL 0  . VITAMIN D 400 UNITS PO CAPS Oral Take 400 Units by mouth daily.      Marland Kitchen FLUOXETINE HCL 10 MG PO CAPS  TAKE ONE CAPSULE BY MOUTH EVERY DAY 30 capsule 1  . LEVOTHYROXINE SODIUM 100 MCG PO TABS  TAKE 1 TABLET BY MOUTH EVERY DAY 30 tablet 4  . LORATADINE 10 MG PO CAPS Oral Take by mouth. As needed      BP 138/79  Pulse 71  Temp(Src) 97.7 F (36.5 C) (Oral)  Resp 18  SpO2 96%  Physical Exam  Nursing note and vitals reviewed. Constitutional: She is oriented to person, place, and time. She appears well-developed and well-nourished. No distress.       obese  HENT:  Head: Normocephalic and atraumatic.  Eyes:       Normal appearance  Neck: Normal range of motion.  Cardiovascular: Normal rate and regular rhythm.  Pulmonary/Chest: Effort normal and breath sounds normal. No respiratory distress. She exhibits no tenderness.       No pain w/ ROM LUE  Abdominal: Soft. Bowel sounds are normal. She exhibits no distension. There is no tenderness. There is no guarding.  Musculoskeletal: She exhibits no edema and no tenderness.       2+ Dorsalis Pedis pulses. No LE edema or calf ttp.    Neurological: She is alert and oriented to person, place, and time.  Skin: Skin is warm and dry. No rash noted. She is not diaphoretic.  Psychiatric: She has a normal mood and affect. Her behavior is normal.    ED Course  Procedures (including critical care time)    Date: 12/13/2011  Rate: 71  Rhythm: normal sinus rhythm  QRS Axis: normal  Intervals: normal  ST/T Wave abnormalities: normal  Conduction Disutrbances:none  Narrative Interpretation:   Old EKG Reviewed: unchanged   Labs Reviewed  BASIC METABOLIC PANEL - Abnormal; Notable for the following:    Potassium 3.4 (*)     Glucose, Bld 134 (*)    GFR calc non Af Amer 68 (*)    GFR calc Af Amer 79 (*)    All other components within normal limits  POCT I-STAT TROPONIN I - Abnormal; Notable for the following:    Troponin i, poc 0.18 (*)    All other components within normal limits  CBC   Dg Chest 2 View  12/12/2011  *RADIOLOGY REPORT*  Clinical Data: Chest pain.  CHEST - 2 VIEW  Comparison: Radiograph dated 04/09/2011  Findings: The heart size and vascularity are normal.  The patient has patchy areas of scarring in both lungs, unchanged since the prior study.  No acute infiltrates or effusions. No acute osseous abnormalities.  IMPRESSION: No acute disease.  Stable scarring in both lungs.  Original Report Authenticated By: Gwynn Burly, M.D.     1. NSTEMI (non-ST elevated myocardial infarction)       MDM  Previously healthy 61yo F presents w/ c/o intermittent, non-exertional left-sided chest pressure w/ associated nausea since yesterday.  No acute findings on exam.  EKG non-ischemic.  Troponin elevated at 0.18 and labs otherwise unremarkable.  Neg CXR.  Pt received morphine and pain improved but still with dull pressure.  Nitro drip initiated.  Pt has already received aspirin.  [facility] consulted for admission and would like to hold off on heparin until they have seen her.  Pt aware of all test results and plan.          Otilio Miu, [place] 12/13/11 [id number]

## 2011-12-12 NOTE — ED Notes (Signed)
Pt to and from x-ray

## 2011-12-13 ENCOUNTER — Other Ambulatory Visit: Payer: Self-pay

## 2011-12-13 ENCOUNTER — Encounter (HOSPITAL_COMMUNITY): Payer: Self-pay | Admitting: *Deleted

## 2011-12-13 DIAGNOSIS — R079 Chest pain, unspecified: Secondary | ICD-10-CM

## 2011-12-13 DIAGNOSIS — R0602 Shortness of breath: Secondary | ICD-10-CM

## 2011-12-13 LAB — BASIC METABOLIC PANEL
BUN: 13 mg/dL (ref 6–23)
Creatinine, Ser: 0.91 mg/dL (ref 0.50–1.10)
GFR calc Af Amer: 79 mL/min — ABNORMAL LOW (ref >90)
GFR calc non Af Amer: 68 mL/min — ABNORMAL LOW (ref >90)
Potassium: 3.4 mEq/L — ABNORMAL LOW (ref 3.5–5.1)

## 2011-12-13 LAB — CARDIAC PANEL(CRET KIN+CKTOT+MB+TROPI)
CK, MB: 11.5 ng/mL (ref 0.3–4.0)
CK, MB: 7.5 ng/mL (ref 0.3–4.0)
Total CK: 96 U/L (ref 7–177)
Troponin I: 2.17 ng/mL (ref <0.30)

## 2011-12-13 LAB — HEPARIN LEVEL (UNFRACTIONATED)
Heparin Unfractionated: 0.4 IU/mL (ref 0.30–0.70)
Heparin Unfractionated: 0.23 IU/mL — ABNORMAL LOW (ref 0.30–0.70)

## 2011-12-13 LAB — PROTIME-INR: INR: 1.01 (ref 0.00–1.49)

## 2011-12-13 MED ORDER — ASPIRIN 81 MG PO CHEW
81.0000 mg | CHEWABLE_TABLET | Freq: Every day | ORAL | Status: DC
  Administered 2011-12-14: 81 mg via ORAL
  Administered 2011-12-15: 324 mg via ORAL
  Administered 2011-12-16 – 2011-12-17 (×2): 81 mg via ORAL
  Filled 2011-12-13 (×2): qty 1
  Filled 2011-12-13: qty 4
  Filled 2011-12-13 (×2): qty 1
  Filled 2011-12-13: qty 4

## 2011-12-13 MED ORDER — NITROGLYCERIN IN D5W 200-5 MCG/ML-% IV SOLN
5.0000 ug/min | INTRAVENOUS | Status: DC
  Administered 2011-12-13: 5 ug/min via INTRAVENOUS
  Filled 2011-12-13: qty 250

## 2011-12-13 MED ORDER — HEPARIN BOLUS VIA INFUSION
4000.0000 [IU] | Freq: Once | INTRAVENOUS | Status: AC
  Administered 2011-12-13: 4000 [IU] via INTRAVENOUS

## 2011-12-13 MED ORDER — PANTOPRAZOLE SODIUM 40 MG PO TBEC
40.0000 mg | DELAYED_RELEASE_TABLET | Freq: Two times a day (BID) | ORAL | Status: DC
  Administered 2011-12-13 – 2011-12-17 (×9): 40 mg via ORAL
  Filled 2011-12-13 (×10): qty 1

## 2011-12-13 MED ORDER — MORPHINE SULFATE 2 MG/ML IJ SOLN
2.0000 mg | INTRAMUSCULAR | Status: DC | PRN
  Administered 2011-12-13 (×2): 2 mg via INTRAVENOUS
  Filled 2011-12-13 (×3): qty 1

## 2011-12-13 MED ORDER — ONDANSETRON HCL 4 MG/2ML IJ SOLN
4.0000 mg | Freq: Once | INTRAMUSCULAR | Status: AC
  Administered 2011-12-13: 4 mg via INTRAVENOUS
  Filled 2011-12-13: qty 2

## 2011-12-13 MED ORDER — HEPARIN SOD (PORCINE) IN D5W 100 UNIT/ML IV SOLN
1350.0000 [IU]/h | INTRAVENOUS | Status: DC
  Administered 2011-12-13: 1200 [IU]/h via INTRAVENOUS
  Administered 2011-12-14: 1350 [IU]/h via INTRAVENOUS
  Filled 2011-12-13 (×6): qty 250

## 2011-12-13 MED ORDER — FLUOXETINE HCL 10 MG PO CAPS
10.0000 mg | ORAL_CAPSULE | Freq: Every day | ORAL | Status: DC
  Administered 2011-12-13 – 2011-12-17 (×5): 10 mg via ORAL
  Filled 2011-12-13 (×5): qty 1

## 2011-12-13 MED ORDER — POTASSIUM CHLORIDE CRYS ER 20 MEQ PO TBCR
20.0000 meq | EXTENDED_RELEASE_TABLET | Freq: Once | ORAL | Status: AC
  Administered 2011-12-13: 20 meq via ORAL
  Filled 2011-12-13: qty 1

## 2011-12-13 MED ORDER — ONDANSETRON HCL 4 MG/2ML IJ SOLN
4.0000 mg | Freq: Four times a day (QID) | INTRAMUSCULAR | Status: DC | PRN
  Administered 2011-12-14: 4 mg via INTRAVENOUS
  Filled 2011-12-13: qty 2

## 2011-12-13 MED ORDER — METOPROLOL TARTRATE 12.5 MG HALF TABLET
12.5000 mg | ORAL_TABLET | Freq: Two times a day (BID) | ORAL | Status: DC
  Administered 2011-12-13 – 2011-12-14 (×3): 12.5 mg via ORAL
  Filled 2011-12-13 (×4): qty 1

## 2011-12-13 MED ORDER — NITROGLYCERIN 0.4 MG SL SUBL
0.4000 mg | SUBLINGUAL_TABLET | SUBLINGUAL | Status: DC | PRN
  Administered 2011-12-14 (×3): 0.4 mg via SUBLINGUAL

## 2011-12-13 MED ORDER — POTASSIUM CHLORIDE 20 MEQ/15ML (10%) PO LIQD
20.0000 meq | Freq: Once | ORAL | Status: AC
  Administered 2011-12-13: 20 meq via ORAL
  Filled 2011-12-13: qty 15

## 2011-12-13 MED ORDER — LEVOTHYROXINE SODIUM 100 MCG PO TABS
100.0000 ug | ORAL_TABLET | Freq: Every day | ORAL | Status: DC
  Administered 2011-12-13 – 2011-12-17 (×5): 100 ug via ORAL
  Filled 2011-12-13 (×7): qty 1

## 2011-12-13 MED ORDER — ACETAMINOPHEN 325 MG PO TABS
650.0000 mg | ORAL_TABLET | Freq: Once | ORAL | Status: AC
  Administered 2011-12-13: 650 mg via ORAL
  Filled 2011-12-13: qty 2

## 2011-12-13 MED ORDER — ACETAMINOPHEN 325 MG PO TABS
650.0000 mg | ORAL_TABLET | ORAL | Status: DC | PRN
  Administered 2011-12-13 – 2011-12-15 (×4): 650 mg via ORAL
  Filled 2011-12-13 (×4): qty 2

## 2011-12-13 NOTE — Progress Notes (Addendum)
SUBJECTIVE:  Has a severe headache on NTG gtt.  No further chest pain  OBJECTIVE:   Vitals:   Filed Vitals:   12/13/11 0445 12/13/11 0500 12/13/11 0515 12/13/11 0540  BP: 153/97 150/89 133/79 138/79  Pulse: 82 72 72 71  Temp:    97.2 F (36.2 C)  TempSrc:    Oral  Resp: 19 20 14 20   Height:    5\' 5"  (1.651 m)  Weight:    103.4 kg (227 lb 15.3 oz)  SpO2: 99% 97% 98% 94%   I&O's:  No intake or output data in the 24 hours ending 12/13/11 1148 TELEMETRY: Reviewed telemetry pt in NSR   PHYSICAL EXAM General: Well developed, well nourished, in no acute distress Head: Eyes PERRLA, No xanthomas.   Normal cephalic and atramatic  Lungs:   Clear bilaterally to auscultation and percussion. Heart:   HRRR S1 S2 Pulses are 2+ & equal.            No carotid bruit. No JVD.  No abdominal bruits. No femoral bruits. Abdomen: Bowel sounds are positive, abdomen soft and non-tender without masses Extremities:   No clubbing, cyanosis or edema.  DP +1 Neuro: Alert and oriented X 3. Psych:  Good affect, responds appropriately   LABS: Basic Metabolic Panel:  Basename 12/12/11 2322  NA 138  K 3.4*  CL 102  CO2 29  GLUCOSE 134*  BUN 13  CREATININE 0.91  CALCIUM 10.1  MG --  PHOS --    Basename 12/12/11 2322  WBC 9.6  NEUTROABS --  HGB 13.0  HCT 38.5  MCV 90.0  PLT 198   Cardiac Enzymes:  Basename 12/13/11 0905  CKTOTAL 129  CKMB 14.8*  CKMBINDEX --  TROPONINI 2.04*    Lab Results  Component Value Date   INR 1.01 12/13/2011    RADIOLOGY: Dg Chest 2 View  12/12/2011  *RADIOLOGY REPORT*  Clinical Data: Chest pain.  CHEST - 2 VIEW  Comparison: Radiograph dated 04/09/2011  Findings: The heart size and vascularity are normal.  The patient has patchy areas of scarring in both lungs, unchanged since the prior study.  No acute infiltrates or effusions. No acute osseous abnormalities.  IMPRESSION: No acute disease.  Stable scarring in both lungs.  Original Report Authenticated By: Gwynn Burly, M.D.      ASSESSMENT:  1.  Chest pain  with cardiac risk factors of age, obesity, remote tobacco use.  She has a history of GERD and also recent thrush .  EKG without acute findings.  Her troponin and MB are elevated with abnormal CPK 2.  NSTEMI by elevated troponin and MB 3.  Anxiety 4.  Obesity 5.  Hypothyroidism 6.  GERD 7.  Recent yeast infection and thrush 8.  Hypokalemia  PLAN:   1.  Continue to cycle cardiac enzymes until they peak 2.  Continue IV Heparin gtt 3.  Stop IV NTG due to headache - if headache does not resolve then head CT 4.  Continue ASA 5.  Add lopressor 12.5mg  BID 6.  2D echo to evaluate LVF.   7.  Probable cath Monday 8.  Replete potassium and check BMET in am  Quintella Reichert, MD  12/13/2011  11:48 AM

## 2011-12-13 NOTE — ED Notes (Signed)
Pt ambulated to RR with steady gait

## 2011-12-13 NOTE — Progress Notes (Signed)
CRITICAL VALUE ALERT  Critical value received:  CKMB 14.8, troponin 2.04  Date of notification:  12/13/2011   Time of notification:  1020  Critical value read back:yes  Nurse who received alert:  Hively, Avie Echevaria, RN  MD notified (1st page):  Kathlen [place], [place]  Time of first page:  1034  MD notified (2nd page):Alinda Money, PA  Time of second page:1057  Responding MD:  Dr. Mayford Knife  Time MD responded:

## 2011-12-13 NOTE — Progress Notes (Signed)
  ANTICOAGULATION CONSULT NOTE - Follow Up Consult  Pharmacy Consult for Heparin Indication: NSTEMI  No Known Allergies  Patient Measurements: Height: 5\' 5"  (165.1 cm) Weight: 227 lb 15.3 oz (103.4 kg) IBW/kg (Calculated) : 57  Heparin Dosing Weight:  81 kg    Vital Signs: Temp: 99 F (37.2 C) (02/02 2100) Temp src: Oral (02/02 1300) BP: 145/74 mmHg (02/02 2100) Pulse Rate: 81  (02/02 2100)  Labs:  Basename 12/13/11 2119 12/13/11 2118 12/13/11 1600 12/13/11 1248 12/13/11 0905 12/12/11 2322  HGB -- -- -- -- -- 13.0  HCT -- -- -- -- -- 38.5  PLT -- -- -- -- -- 198  APTT -- -- -- -- -- --  LABPROT -- -- -- -- 13.5 --  INR -- -- -- -- 1.01 --  HEPARINUNFRC 0.23* -- -- 0.40 -- --  CREATININE -- -- -- -- -- 0.91  CKTOTAL -- 96 124 -- 129 --  CKMB -- 7.5* 11.5* -- 14.8* --  TROPONINI -- 1.64* 2.17* -- 2.04* --   Estimated Creatinine Clearance: 80.4 ml/min (by C-G formula based on Cr of 0.91).   Assessment: 61 y/o female with CP/SOB, troponin 2.04->2.17->1.64. Patient placed on IV heparin, ASA, metoprolol, IV NTG Heparin level now subtherapeutic. No bleeding noted.  No issues with infusion per RN.  Goal of Therapy:  Heparin level 0.3-0.7 units/ml   Plan:  1. Increase heparin to 1350 units/hr . 2. Check 6 hr heparin level  Hildred Mollica, Hilario Quarry 12/13/2011,10:56 PM

## 2011-12-13 NOTE — Progress Notes (Signed)
ANTICOAGULATION CONSULT NOTE - Initial Consult  Pharmacy Consult for heparin Indication: chest pain/ACS  No Known Allergies  Patient Measurements: Height: 5\' 5"  (165.1 cm) Weight: 227 lb 15.3 oz (103.4 kg) IBW/kg (Calculated) : 57  Heparin Dosing Weight: 81kg  Vital Signs: Temp: 97.2 F (36.2 C) (02/02 0540) Temp src: Oral (02/02 0540) BP: 138/79 mmHg (02/02 0540) Pulse Rate: 71  (02/02 0540)  Labs:  Basename 12/12/11 2322  HGB 13.0  HCT 38.5  PLT 198  APTT --  LABPROT --  INR --  HEPARINUNFRC --  CREATININE 0.91  CKTOTAL --  CKMB --  TROPONINI --   Estimated Creatinine Clearance: 80.4 ml/min (by C-G formula based on Cr of 0.91).  Medical History: Past Medical History  Diagnosis Date  . Allergic rhinitis   . Depression   . GERD (gastroesophageal reflux disease)   . Headache   . Hiatal hernia   . Diverticulosis of colon (without mention of hemorrhage)   . Thyroid disease   . IBS (irritable bowel syndrome)   . Anxiety   . Hemorrhoids     Medications:  Scheduled:    . acetaminophen  650 mg Oral Once  . aspirin  81 mg Oral Daily  . FLUoxetine  10 mg Oral Daily  . levothyroxine  100 mcg Oral QAC breakfast  .  morphine injection  4 mg Intravenous Once  . ondansetron  4 mg Intravenous Once  . pantoprazole  40 mg Oral BID AC  . potassium chloride  20 mEq Oral Once    Assessment: 61yo female c/o CP associated with SOB, pain persists with NTG gtt, initial troponin elevated, to begin heparin.  Goal of Therapy:  Heparin level 0.3-0.7 units/ml   Plan:  Will give heparin 4000 units IV bolus x1 followed by gtt at 1200 units/hr and monitor heparin levels and CBC.  Colleen Can PharmD BCPS 12/13/2011,5:55 AM

## 2011-12-13 NOTE — ED Notes (Signed)
Cards MD at bedside

## 2011-12-13 NOTE — ED Notes (Signed)
Report called to [facility] on floor and patient ready for transport.

## 2011-12-13 NOTE — Progress Notes (Signed)
  ANTICOAGULATION CONSULT NOTE - Follow Up Consult  Pharmacy Consult for Heparin Indication: NSTEMI  No Known Allergies  Patient Measurements: Height: 5\' 5"  (165.1 cm) Weight: 227 lb 15.3 oz (103.4 kg) IBW/kg (Calculated) : 57  Heparin Dosing Weight:     Vital Signs: Temp: 97.2 F (36.2 C) (02/02 0540) Temp src: Oral (02/02 0540) BP: 138/79 mmHg (02/02 0540) Pulse Rate: 71  (02/02 0540)  Labs:  Basename 12/13/11 1248 12/13/11 0905 12/12/11 2322  HGB -- -- 13.0  HCT -- -- 38.5  PLT -- -- 198  APTT -- -- --  LABPROT -- 13.5 --  INR -- 1.01 --  HEPARINUNFRC 0.40 -- --  CREATININE -- -- 0.91  CKTOTAL -- 129 --  CKMB -- 14.8* --  TROPONINI -- 2.04* --   Estimated Creatinine Clearance: 80.4 ml/min (by C-G formula based on Cr of 0.91).   Medications:  Prescriptions prior to admission  Medication Sig Dispense Refill  . Acetaminophen (TYLENOL EXTRA STRENGTH PO) Take 1 tablet by mouth daily as needed. For pain      . Alum & Mag Hydroxide-Simeth (MAGIC MOUTHWASH W/LIDOCAINE) SOLN Take 5 mLs by mouth 4 (four) times daily.  120 mL  0  . Cholecalciferol (VITAMIN D) 400 UNITS capsule Take 400 Units by mouth daily.        Marland Kitchen FLUoxetine (PROZAC) 10 MG capsule TAKE ONE CAPSULE BY MOUTH EVERY DAY  30 capsule  1  . levothyroxine (SYNTHROID, LEVOTHROID) 100 MCG tablet TAKE 1 TABLET BY MOUTH EVERY DAY  30 tablet  4  . Loratadine (CLARITIN) 10 MG CAPS Take by mouth. As needed      . omeprazole (PRILOSEC) 20 MG capsule Take 20 mg by mouth daily.        Assessment: 61 y/o female with CP/SOB, troponin 2.04, CKMB 14.8. Patient placed on IV heparin, ASA, metoprolol, IV NTG First heparin level 0.4 in goal range.  Goal of Therapy:  Heparin level 0.3-0.7 units/ml   Plan:  Recheck another heparin level in 6 hrs for confirmation.  Misty Stanley Stillinger 12/13/2011,2:02 PM

## 2011-12-13 NOTE — H&P (Addendum)
Physician History and Physical    Sherena Machorro MRN: [id number] DOB/AGE: 13-Jul-1951 61 y.o. Admit date: 12/12/2011  Primary Care Physician:  Dr. Fabian Sharp Primary Cardiologist:  New pt  CC:  Chest pain, elevated troponin  HPI: Pt is a 61 yo woman prior smoker with obesity, hypothyroidism, hypertriglyceridemia, GERD, HH, who presents with 3 d hx of chest pain.  Pt reports that she has had multiple episodes of chest pain over the course of the past few days on and off.  She describes the pain as a pressure/dull pain/burning in her L chest that has occurred with walking up stairs and at rest, not exacerbated by eating.  The pain is not assoc with SOB, diaphoresis.  She did get nauseous today when the pain come on.  Even at this time, the pain continues to increase and decrease in intensity without any particular aggravating or alleviating factors.  She was given SL nitro by EMS with possible improvement of pain, but at this time, she is on nitro gtt and her pain persists.  Morphine did not improve the pain either.  Her pain was helped earlier in the week with TUMS and a probiotic, but this did not help today.  She reports that the pain she typically has with her GERD is much different than this and is less in severity.  Of note, pt was recently put on an antibiotic and had a yeast infection and thrush.  She stopped taking treatment for this at the beginning of this week.  At this time, she is still having pain and also has a headache from the nitro.  Additionally, she had felt as if pills have gotten stuck in her throat in the recent past.  She has never had chest pain prior to this episode.  Review of systems: A review of 10 organ systems was done and is negative except as stated above in HPI  Past Medical History  Diagnosis Date  . Allergic rhinitis   . Depression   . GERD (gastroesophageal reflux disease)   . Headache   . Hiatal hernia   . Diverticulosis of colon (without mention of hemorrhage)     . Thyroid disease   . IBS (irritable bowel syndrome)   . Anxiety   . Hemorrhoids    Past Surgical History  Procedure Date  . Cholecystectomy   . Tonsillectomy and adenoidectomy    History   Social History  . Marital Status: Divorced    Spouse Name: N/A    Number of Children: 1  . Years of Education: N/A   Occupational History  . Direct Sales Rep    Social History Main Topics  . Smoking status: Former Smoker - about 20 pack years, quit <10 years ago  . Smokeless tobacco: Never Used  . Alcohol Use: No  . Drug Use: No  . Sexually Active: Not on file   Other Topics Concern  . Not on file   Social History Narrative   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   40 hours per week.HH of 1DivorcedRegular exercise- noWas caretaker for mom who died in 2010-01-13G1P10 Caffeine drinks daily     Family History  Problem Relation Age of Onset  . Arthritis Mother     osteo  . Breast cancer Mother   . Coronary artery disease Mother   . Other Mother     hearing loss  . Diabetes Brother     50 s  . Thyroid disease Mother   .  Colon cancer Neg Hx     No fam hx early CAD Mother had a CABG in her 65s  No Known Allergies    Current facility-administered medications:morphine 4 MG/ML injection 4 mg, 4 mg, Intravenous, Once, [employer], PA, 4 mg at 12/13/11 0012;  nitroGLYCERIN 0.2 mg/mL in dextrose 5 % infusion, 5 mcg/min, Intravenous, Titrated, Otilio Miu, PA Current outpatient prescriptions:Acetaminophen (TYLENOL EXTRA STRENGTH PO), Take 1 tablet by mouth daily as needed. For pain, Disp: , Rfl: ;  Alum & Mag Hydroxide-Simeth (MAGIC MOUTHWASH W/LIDOCAINE) SOLN, Take 5 mLs by mouth 4 (four) times daily., Disp: 120 mL, Rfl: 0;  Cholecalciferol (VITAMIN D) 400 UNITS capsule, Take 400 Units by mouth daily.  , Disp: , Rfl:  FLUoxetine (PROZAC) 10 MG capsule, TAKE ONE CAPSULE BY MOUTH EVERY DAY, Disp: 30 capsule, Rfl: 1;  levothyroxine (SYNTHROID, LEVOTHROID)  100 MCG tablet, TAKE 1 TABLET BY MOUTH EVERY DAY, Disp: 30 tablet, Rfl: 4;  Loratadine (CLARITIN) 10 MG CAPS, Take by mouth. As needed, Disp: , Rfl:   Physical Exam: Blood pressure 143/107, pulse 88, temperature 97.7 F (36.5 C), temperature source Oral, resp. rate 23, SpO2 97.00%.; There is no height or weight on file to calculate BMI. Temp:  [97.7 F (36.5 C)] 97.7 F (36.5 C) (02/01 2143) Pulse Rate:  [71-88] 88  (02/02 0015) Resp:  [16-23] 23  (02/02 0015) BP: (138-153)/(79-107) 143/107 mmHg (02/02 0015) SpO2:  [96 %-100 %] 97 % (02/02 0015)  No intake or output data in the 24 hours ending 12/13/11 0035 General: NAD Heent: MMM Neck: No JVD  CV: Nondisplaced PMI.  RRR, nl S1/S2, no S3/S4, no murmur. No chest wall tenderness to palpation   Lungs: Clear to auscultation bilaterally with normal respiratory effort Abdomen: obese, Soft, nontender, nondistended Extremities: No clubbing or cyanosis.  No pedal edema Skin: Intact without lesions or rashes  Neurologic: Alert and oriented x 3, grossly nonfocal  Psych: Normal mood and affect    Labs: No results found for this basename: CKTOTAL:4,CKMB:4,TROPONINI:4 in the last 72 hours Lab Results  Component Value Date   WBC 9.6 12/12/2011   HGB 13.0 12/12/2011   HCT 38.5 12/12/2011   MCV 90.0 12/12/2011   PLT 198 12/12/2011    Lab 12/12/11 2322  NA 138  K 3.4*  CL 102  CO2 29  BUN 13  CREATININE 0.91  CALCIUM 10.1  PROT --  BILITOT --  ALKPHOS --  ALT --  AST --  GLUCOSE 134*   Lab Results  Component Value Date   CHOL 164 04/02/2011   HDL 47.60 04/02/2011   LDLCALC 100* 04/02/2011   TRIG 80.0 04/02/2011     troponins 0.18  EKG:  Sinus 71, no acute ischemic changes  Radiology:  Dg Chest 2 View  12/12/2011  *RADIOLOGY REPORT*  Clinical Data: Chest pain.  CHEST - 2 VIEW  Comparison: Radiograph dated 04/09/2011  Findings: The heart size and vascularity are normal.  The patient has patchy areas of scarring in both lungs, unchanged  since the prior study.  No acute infiltrates or effusions. No acute osseous abnormalities.  IMPRESSION: No acute disease.  Stable scarring in both lungs.  Original Report Authenticated By: Gwynn Burly, M.D.    ASSESSMENT:  Pt is a 61 yo woman prior smoker with obesity, hypothyroidism, hypertriglyceridemia, GERD, HH, who presents with 3 d hx of chest pain.  EKG without ischemia, but troponin mildly elevated.  PLAN: Chest pain - ddx includes angina (  cardiac risk factors: obesity, age, prior smoker), GI (GERD, esophagitis - infectious (given recent thrush) vs other, esophageal spasm), MSK etiology - EKG without acute ischemic changes - given elevated troponin, will start heparin at this time - monitor serial cardiac enzymes and check CK/MB as well --> if enzymes continue to trend up, will cath.  If troponin decreases or stabilize with normal CK/MB and continued normal EKG, will send to nuclear stress test in am - recheck EKG in am - check echo in am - continue to titrate nitro gtt for now to see if relieves pain, otherwise can try additional doses of morphine or even try tylenol since morphine hasn't helped - if cardiac w/u unrevealing, pt will need to see her primary GI doctor for likely EGD for w/u for possible esophageal pathology - lipids are optimized at this time - LDL 100, HDL 47, Trig 80 - place pt on protonix BID to see if this improves sx  Anxiety - continue home prozac Hypothyroid - continue home synthroid FEN - NPO p MN, replace K Ppx - heparin gtt Dispo - admit to [facility] for chest pain with elevated cardiac enzymes   Signed: Hilary Hertz, MD Cardiology Fellow 12/13/2011, 12:35 AM

## 2011-12-13 NOTE — ED Provider Notes (Signed)
Medical screening examination/treatment/procedure(s) were conducted as a shared visit with non-physician practitioner(s) and myself.  I personally evaluated the patient during the encounter  Pt seen and examined.  Awake, alert, pain 1/10.   Pt with left lower chest pain.  EKG without acute abnormalities (reviewed by me), troponin elevated.  Pt treated with asa, nitro, morphine.  Cardiology consulted for admission.  They will see patient in the ED.  Cards was asked over telephone if they would like heparin started and the response was that she would be seen in the ED.  Pt updated by myself and the PA about the findings and plan.    Ethelda Chick, MD 12/13/11 [phone]

## 2011-12-14 ENCOUNTER — Other Ambulatory Visit: Payer: Self-pay

## 2011-12-14 LAB — BASIC METABOLIC PANEL
BUN: 14 mg/dL (ref 6–23)
Calcium: 9.2 mg/dL (ref 8.4–10.5)
Calcium: 9.1 mg/dL (ref 8.4–10.5)
Creatinine, Ser: 0.81 mg/dL (ref 0.50–1.10)
GFR calc Af Amer: >90 mL/min (ref >90)
GFR calc non Af Amer: 70 mL/min — ABNORMAL LOW (ref >90)
Glucose, Bld: 107 mg/dL — ABNORMAL HIGH (ref 70–99)
Potassium: 3.4 mEq/L — ABNORMAL LOW (ref 3.5–5.1)
Sodium: 141 mEq/L (ref 135–145)

## 2011-12-14 LAB — PLATELET INHIBITION P2Y12: Platelet Function  P2Y12: 275 [PRU] (ref 194–418)

## 2011-12-14 LAB — CBC
MCH: 30 pg (ref 26.0–34.0)
MCV: 91.8 fL (ref 78.0–100.0)
Platelets: 171 10*3/uL (ref 150–400)
RDW: 13.5 % (ref 11.5–15.5)
WBC: 5.9 10*3/uL (ref 4.0–10.5)

## 2011-12-14 LAB — MAGNESIUM: Magnesium: 1.9 mg/dL (ref 1.5–2.5)

## 2011-12-14 MED ORDER — DIAZEPAM 5 MG PO TABS
5.0000 mg | ORAL_TABLET | ORAL | Status: AC
  Administered 2011-12-15: 5 mg via ORAL
  Filled 2011-12-14: qty 1

## 2011-12-14 MED ORDER — ASPIRIN 81 MG PO CHEW
324.0000 mg | CHEWABLE_TABLET | ORAL | Status: AC
  Administered 2011-12-15: 324 mg via ORAL

## 2011-12-14 MED ORDER — METOPROLOL TARTRATE 25 MG PO TABS
25.0000 mg | ORAL_TABLET | Freq: Two times a day (BID) | ORAL | Status: DC
  Administered 2011-12-14 – 2011-12-17 (×6): 25 mg via ORAL
  Filled 2011-12-14 (×7): qty 1

## 2011-12-14 MED ORDER — NITROGLYCERIN IN D5W 200-5 MCG/ML-% IV SOLN
INTRAVENOUS | Status: AC
  Filled 2011-12-14: qty 250

## 2011-12-14 MED ORDER — SODIUM CHLORIDE 0.9 % IJ SOLN: 3.0000 mL | INTRAMUSCULAR | Status: DC | PRN

## 2011-12-14 MED ORDER — SODIUM CHLORIDE 0.9 % IJ SOLN
3.0000 mL | Freq: Two times a day (BID) | INTRAMUSCULAR | Status: DC
  Administered 2011-12-14 – 2011-12-17 (×6): 3 mL via INTRAVENOUS

## 2011-12-14 MED ORDER — NITROGLYCERIN IN D5W 200-5 MCG/ML-% IV SOLN
20.0000 ug/min | INTRAVENOUS | Status: DC
  Administered 2011-12-14: 20 ug/min via INTRAVENOUS

## 2011-12-14 MED ORDER — SODIUM CHLORIDE 0.9 % IV SOLN: 250.0000 mL | INTRAVENOUS | Status: DC | PRN

## 2011-12-14 MED ORDER — SODIUM CHLORIDE 0.9 % IV SOLN
INTRAVENOUS | Status: DC
  Administered 2011-12-15: 05:00:00 via INTRAVENOUS

## 2011-12-14 MED ORDER — SALINE SPRAY 0.65 % NA SOLN
1.0000 | NASAL | Status: DC | PRN
  Filled 2011-12-14: qty 44

## 2011-12-14 MED ORDER — MORPHINE SULFATE 2 MG/ML IJ SOLN
2.0000 mg | INTRAMUSCULAR | Status: DC | PRN
  Administered 2011-12-14 – 2011-12-15 (×7): 2 mg via INTRAVENOUS
  Filled 2011-12-14 (×7): qty 1

## 2011-12-14 NOTE — Progress Notes (Addendum)
Called by RN because of chest pain. Ordered IV NTG and increased to 20 mcg/min, plus morphine PRN for pain. Pt is NSTEMI and for cath in am. If Sx do not improve, may need cath sooner. Check lipids in am. Add statin. Check TSH.  Addendum: Pt with recurrent/ongoing chest pain. Have notified [facility] and will Tx to stepdown.

## 2011-12-14 NOTE — Progress Notes (Signed)
Pt with CP 4/10 midsternal with no radiation. EKG obtained per protocol. Pt given 2 mg morphine IV @ 2200.  Pt with nitro drip @ 20 mcg/min.  BP 161/77 O2 98% 4L [place] HR 82.  No changes in CP w/ morphine.  Additional 2 mg morphine given per orders.  Pt states pain @ 2/10.  Reid Hope King PA notified of continued chest pain.  Nitro increased to 25 mcg/min.  BP 147/78 HR 82.  Pt still with complaints of "dull achy pain."  Order received to transfer patient to step down.  Patient transferred to stepdown unit on monitor with RN.  Transferred to 2922.  Report called to [facility].  Pt stated she will notify family.

## 2011-12-14 NOTE — Progress Notes (Signed)
  ANTICOAGULATION CONSULT NOTE - Follow Up Consult  Pharmacy Consult for Heparin Indication: NSTEMI  No Known Allergies  Patient Measurements: Height: 5\' 5"  (165.1 cm) Weight: 227 lb 15.3 oz (103.4 kg) IBW/kg (Calculated) : 57  Heparin Dosing Weight:  81 kg    Vital Signs: Temp: 98.8 F (37.1 C) (02/03 0500) BP: 145/83 mmHg (02/03 0500) Pulse Rate: 73  (02/03 0500)  Labs:  Basename 12/14/11 0612 12/13/11 2119 12/13/11 2118 12/13/11 1600 12/13/11 1248 12/13/11 0905 12/12/11 2322  HGB 12.4 -- -- -- -- -- 13.0  HCT 37.9 -- -- -- -- -- 38.5  PLT 171 -- -- -- -- -- 198  APTT -- -- -- -- -- -- --  LABPROT -- -- -- -- -- 13.5 --  INR -- -- -- -- -- 1.01 --  HEPARINUNFRC 0.49 0.23* -- -- 0.40 -- --  CREATININE 0.81 -- -- -- -- -- 0.91  CKTOTAL -- -- 96 124 -- 129 --  CKMB -- -- 7.5* 11.5* -- 14.8* --  TROPONINI -- -- 1.64* 2.17* -- 2.04* --   Estimated Creatinine Clearance: 90.4 ml/min (by C-G formula based on Cr of 0.81).  PMH:  Allergic rhinitis Depression GERD Headache Hiatal hernia Diverticulosis of colon (without mention of hemorrhage) Thyroid disease IBS Anxiety Hemorrhoids   Assessment: 61 y/o female with CP/SOB, troponin 2.04->2.17->1.64. Patient placed on IV heparin, ASA, metoprolol, IV NTG. IV NTG has been stopped.  Heparin level = 0.49 is therapeutic on 1350 units/hr.  No bleeding noted.  CBC stable, pltc decreased to 171. Probable Cath on 12/15/11.  Goal of Therapy:  Heparin level 0.3-0.7 units/ml   Plan:  1. Continue heparin to 1350 units/hr . 2. Check heparin level and CBC every AM.  Arman Filter 12/14/2011,8:57 AM   A

## 2011-12-14 NOTE — Progress Notes (Signed)
   The patient has had chest pain tonight.  Currently very mild discomfort.  No acute EKG changes.    Lungs:   COR:    Continue current therapy.  Transfer to step down.  Still plan for cath in the am.  Rollene Rotunda 12/14/2011 11:03 PM

## 2011-12-14 NOTE — Progress Notes (Signed)
SUBJECTIVE:  Headache resolved after stopping NTG gtt.  Had some vague chest discomfort over night  OBJECTIVE:   Vitals:   Filed Vitals:   12/13/11 0540 12/13/11 1300 12/13/11 2100 12/14/11 0500  BP: 138/79 154/83 145/74 145/83  Pulse: 71 75 81 73  Temp: 97.2 F (36.2 C) 98.4 F (36.9 C) 99 F (37.2 C) 98.8 F (37.1 C)  TempSrc: Oral Oral    Resp: 20 18 20 18   Height: 5\' 5"  (1.651 m)     Weight: 103.4 kg (227 lb 15.3 oz)     SpO2: 94% 96% 96% 93%   I&O's:   Intake/Output Summary (Last 24 hours) at 12/14/11 1106 Last data filed at 12/13/11 2100  Gross per 24 hour  Intake 495.08 ml  Output      0 ml  Net 495.08 ml   TELEMETRY: Reviewed telemetry pt in NSR     PHYSICAL EXAM General: Well developed, well nourished, in no acute distress Head: Eyes PERRLA, No xanthomas.   Normal cephalic and atramatic  Lungs:   Clear bilaterally to auscultation and percussion. Heart:   HRRR S1 S2 Pulses are 2+ & equal.            No carotid bruit. No JVD.  No abdominal bruits. No femoral bruits. Abdomen: Bowel sounds are positive, abdomen soft and non-tender without masses  Extremities:   No clubbing, cyanosis or edema.  DP +1 Neuro: Alert and oriented X 3. Psych:  Good affect, responds appropriately   LABS: Basic Metabolic Panel:  Basename 12/14/11 0612 12/14/11 0013 12/12/11 2322  NA 141 -- 138  K 3.9 -- 3.4*  CL 103 -- 102  CO2 29 -- 29  GLUCOSE 92 -- 134*  BUN 13 -- 13  CREATININE 0.81 -- 0.91  CALCIUM 9.2 -- 10.1  MG -- 1.9 --  PHOS -- -- --   CBC:  Basename 12/14/11 0612 12/12/11 2322  WBC 5.9 9.6  NEUTROABS -- --  HGB 12.4 13.0  HCT 37.9 38.5  MCV 91.8 90.0  PLT 171 198   Cardiac Enzymes:  Basename 12/13/11 2118 12/13/11 1600 12/13/11 0905  CKTOTAL 96 124 129  CKMB 7.5* 11.5* 14.8*  CKMBINDEX -- -- --  TROPONINI 1.64* 2.17* 2.04*    Lab Results  Component Value Date   INR 1.01 12/13/2011    RADIOLOGY: Dg Chest 2 View  12/12/2011  *RADIOLOGY REPORT*   Clinical Data: Chest pain.  CHEST - 2 VIEW  Comparison: Radiograph dated 04/09/2011  Findings: The heart size and vascularity are normal.  The patient has patchy areas of scarring in both lungs, unchanged since the prior study.  No acute infiltrates or effusions. No acute osseous abnormalities.  IMPRESSION: No acute disease.  Stable scarring in both lungs.  Original Report Authenticated By: Gwynn Burly, M.D.      ASSESSMENT:  1. Chest pain with cardiac risk factors of age, obesity, remote tobacco use. She has a history of GERD and also recent thrush . EKG without acute findings. Her troponin and MB are elevated with abnormal CPK  2. NSTEMI by elevated troponin and MB  3. Anxiety  4. Obesity  5. Hypothyroidism  6. GERD  7. Recent yeast infection and thrush  8. Hypokalemia - resolved   PLAN:   1.  NPO after midnight 2.  Cardiac cath in am by [place] 3.  Continue IV Heparin gtt/ASA/beta blocke 4.  Cardiac catheterization was discussed with the patient fully including risks on myocardial infarction,  death, stroke, bleeding, arrhythmia, dye allergy, renal insufficiency or bleeding.  All patient questions and concerns were discussed and the patient understands and is willing to proceed.      Quintella Reichert, MD  12/14/2011  11:06 AM

## 2011-12-14 NOTE — Progress Notes (Signed)
Pt c/o 2/10 chest pressure.  SL NTG administered.  VSS.  EKG obtained.  Pain 6/10 @ 5 minute post medication time.  2nd SL NTG administered.  O2 applied at 2L [place].  VS 186/104.  MD paged.  Orders rec'd for NTG gtt and morphine 2 mg IV.  Will closely monitor.  Stedman [place], [facility]

## 2011-12-15 ENCOUNTER — Other Ambulatory Visit: Payer: Self-pay

## 2011-12-15 ENCOUNTER — Encounter (HOSPITAL_COMMUNITY)
Admission: EM | Disposition: A | Discharge status: Home / Self Care | Payer: Self-pay | Source: Home / Self Care | Attending: Cardiology

## 2011-12-15 ENCOUNTER — Encounter (HOSPITAL_COMMUNITY): Payer: Self-pay | Admitting: Internal Medicine

## 2011-12-15 DIAGNOSIS — I214 Non-ST elevation (NSTEMI) myocardial infarction: Secondary | ICD-10-CM

## 2011-12-15 HISTORY — PX: LEFT HEART CATHETERIZATION WITH CORONARY ANGIOGRAM: SHX5451

## 2011-12-15 LAB — CBC
HCT: 37.6 % (ref 36.0–46.0)
Hemoglobin: 11.6 g/dL — ABNORMAL LOW (ref 12.0–15.0)
Hemoglobin: 12.2 g/dL (ref 12.0–15.0)
MCH: 29.6 pg (ref 26.0–34.0)
MCHC: 32.4 g/dL (ref 30.0–36.0)
RBC: 3.92 MIL/uL (ref 3.87–5.11)
WBC: 6.8 10*3/uL (ref 4.0–10.5)

## 2011-12-15 LAB — HEPATIC FUNCTION PANEL
ALT: 115 U/L — ABNORMAL HIGH (ref 0–35)
AST: 136 U/L — ABNORMAL HIGH (ref 0–37)
Albumin: 3.2 g/dL — ABNORMAL LOW (ref 3.5–5.2)
Total Protein: 6.5 g/dL (ref 6.0–8.3)

## 2011-12-15 LAB — CREATININE, SERUM
GFR calc Af Amer: >90 mL/min (ref >90)
GFR calc non Af Amer: >90 mL/min (ref >90)

## 2011-12-15 LAB — BASIC METABOLIC PANEL
BUN: 13 mg/dL (ref 6–23)
CO2: 28 mEq/L (ref 19–32)
Calcium: 9.1 mg/dL (ref 8.4–10.5)
Creatinine, Ser: 0.76 mg/dL (ref 0.50–1.10)
Glucose, Bld: 110 mg/dL — ABNORMAL HIGH (ref 70–99)

## 2011-12-15 LAB — LIPID PANEL
Cholesterol: 147 mg/dL (ref 0–200)
HDL: 48 mg/dL (ref >39)
LDL Cholesterol: 78 mg/dL (ref 0–99)
Triglycerides: 107 mg/dL (ref <150)

## 2011-12-15 LAB — POCT ACTIVATED CLOTTING TIME: Activated Clotting Time: 193 seconds

## 2011-12-15 LAB — TSH: TSH: 2.833 u[IU]/mL (ref 0.350–4.500)

## 2011-12-15 SURGERY — LEFT HEART CATHETERIZATION WITH CORONARY ANGIOGRAM
Anesthesia: LOCAL

## 2011-12-15 MED ORDER — ROSUVASTATIN CALCIUM 40 MG PO TABS
40.0000 mg | ORAL_TABLET | Freq: Every day | ORAL | Status: DC
  Administered 2011-12-15: 40 mg via ORAL
  Filled 2011-12-15 (×2): qty 1

## 2011-12-15 MED ORDER — HEPARIN SODIUM (PORCINE) 5000 UNIT/ML IJ SOLN
5000.0000 [IU] | Freq: Three times a day (TID) | INTRAMUSCULAR | Status: DC
  Administered 2011-12-16 – 2011-12-17 (×4): 5000 [IU] via SUBCUTANEOUS
  Filled 2011-12-15 (×7): qty 1

## 2011-12-15 MED ORDER — NITROGLYCERIN IN D5W 200-5 MCG/ML-% IV SOLN: 2.0000 ug/min | INTRAVENOUS | Status: DC

## 2011-12-15 MED ORDER — VERAPAMIL HCL 2.5 MG/ML IV SOLN
INTRAVENOUS | Status: AC
  Filled 2011-12-15: qty 2

## 2011-12-15 MED ORDER — FENTANYL CITRATE 0.05 MG/ML IJ SOLN
INTRAMUSCULAR | Status: AC
  Filled 2011-12-15: qty 2

## 2011-12-15 MED ORDER — ONDANSETRON HCL 4 MG/2ML IJ SOLN: 4.0000 mg | Freq: Four times a day (QID) | INTRAMUSCULAR | Status: DC | PRN

## 2011-12-15 MED ORDER — SODIUM CHLORIDE 0.9 % IV SOLN
INTRAVENOUS | Status: AC
  Administered 2011-12-15: 20:00:00 via INTRAVENOUS

## 2011-12-15 MED ORDER — MIDAZOLAM HCL 2 MG/2ML IJ SOLN
INTRAMUSCULAR | Status: AC
  Filled 2011-12-15: qty 2

## 2011-12-15 MED ORDER — ACETAMINOPHEN 325 MG PO TABS: 650.0000 mg | ORAL_TABLET | ORAL | Status: DC | PRN

## 2011-12-15 MED ORDER — HEPARIN SODIUM (PORCINE) 1000 UNIT/ML IJ SOLN
INTRAMUSCULAR | Status: AC
  Filled 2011-12-15: qty 1

## 2011-12-15 MED ORDER — NITROGLYCERIN 0.4 MG SL SUBL
SUBLINGUAL_TABLET | SUBLINGUAL | Status: AC
  Administered 2011-12-15: 16:00:00
  Filled 2011-12-15: qty 25

## 2011-12-15 NOTE — Progress Notes (Signed)
Patient received into room 2922, chest pain free upon arrival to department, complaints of a dull headache. Nitroglycerin infusing at , Heparin infusing at 1350/h. Patient placed on telemetry monitor, oriented to room, and unit procedures. CHG bath given. Patient provided with an update re: current plan of care. Call bell placed within reach. Patient reminded to notify RN promptly of any chest pain/discomfort.

## 2011-12-15 NOTE — Interval H&P Note (Signed)
History and Physical Interval Note:  12/15/2011 4:48 PM  Shelby Wong  has presented today for surgery, with the diagnosis of NSTEMI  The various methods of treatment have been discussed with the patient and family. After consideration of risks, benefits and other options for treatment, the patient has consented to  Procedure(s): LEFT HEART CATHETERIZATION WITH CORONARY ANGIOGRAM as a surgical intervention .  The patients' history has been reviewed, patient examined, no change in status, stable for surgery.  I have reviewed the patients' chart and labs.  Questions were answered to the patient's satisfaction.     Arion Morgan

## 2011-12-15 NOTE — H&P (View-Only) (Signed)
   Patient Name: Shelby Wong Date of Encounter: 12/15/2011, 7:30 AM    Subjective  Has mild substernal chest pressure that she feels is hunger. It is different than her presenting intense pain which has completely resolved. No SOB. Didn't sleep well last night.   Objective   Telemetry: NSR/SB Physical Exam: Filed Vitals:   12/15/11 0347  BP: 111/68  Pulse: 55  Temp: 98 F (36.7 C)  Resp: 11   General: Well developed, well nourished, in no acute distress. Head: Normocephalic, atraumatic, sclera non-icteric, no xanthomas, nares are without discharge.  Neck: Negative for carotid bruits. JVD not elevated. Lungs: Clear bilaterally to auscultation without wheezes, rales, or rhonchi. Breathing is unlabored. Heart: RRR S1 S2 without murmurs, rubs, or gallops.  Abdomen: Soft, non-tender, non-distended with normoactive bowel sounds. No hepatomegaly. No rebound/guarding. No obvious abdominal masses. Msk:  Strength and tone appear normal for age. Extremities: No clubbing or cyanosis. No edema.  Distal pedal pulses are 2+ and equal bilaterally. Neuro: Alert and oriented X 3. Moves all extremities spontaneously. Psych:  Responds to questions appropriately with a normal affect.    Intake/Output Summary (Last 24 hours) at 12/15/11 0730 Last data filed at 12/15/11 0700  Gross per 24 hour  Intake 948.75 ml  Output    650 ml  Net 298.75 ml    Labs:  Basename 12/15/11 0504 12/14/11 1200 12/14/11 0013  NA 140 141 --  K 4.0 3.4* --  CL 104 103 --  CO2 28 27 --  GLUCOSE 110* 107* --  BUN 13 14 --  CREATININE 0.76 0.89 --  CALCIUM 9.1 9.1 --  MG -- -- 1.9  PHOS -- -- --    Basename 12/15/11 0455 12/14/11 0612  WBC 6.8 5.9  NEUTROABS -- --  HGB 11.6* 12.4  HCT 36.4 37.9  MCV 92.9 91.8  PLT 187 171    Basename 12/13/11 2118 12/13/11 1600 12/13/11 0905  CKTOTAL 96 124 129  CKMB 7.5* 11.5* 14.8*  TROPONINI 1.64* 2.17* 2.04*   Basename 12/15/11 0455  CHOL 147  HDL 48  LDLCALC 78    TRIG 107  CHOLHDL 3.1    Radiology/Studies:  1. Chest 2 View 12/12/2011  IMPRESSION: No acute disease.  Stable scarring in both lungs.  Original Report Authenticated By: JAMES H. MAXWELL, M.D.    Assessment and Plan   1. Chest pain/NSTEMI - continue ASA, BB, heparin gtt. Will add-on LFT's to labs and start statin today.  Plan cath this AM.   2. Hypothyroidism - continue synthroid  3. GERD - stable, continue PPI.  Signed, Dayna Dunn PA-C  Patient seen, examined. Available data reviewed. Agree with findings, assessment, and plan as outlined by Dayna Dunn, PA-C. I have independently examined the patient and discussed our treatment plan of cardiac cath and possible PCI this morning. We reviewed risks, benefits, and alternatives to cath and PCI in detail and she understands. Pt is on ASA, heparin, and NTG. She has not been preloaded with a thienopyridine and will wait until she undergoes cath and has her coronary anatomy defined since her enzymes are now trending down.  Latina Frank, M.D. 12/15/2011 7:50 AM   

## 2011-12-15 NOTE — Progress Notes (Signed)
Pt still c/o of CP prior to transport to stepdown.  Nitro increased to 30 mcg/min.  Pt still with c/o dull ache CP but "getting better."

## 2011-12-15 NOTE — Progress Notes (Signed)
UR Completed. Simmons, Brison Fiumara F [phone]  

## 2011-12-15 NOTE — Op Note (Signed)
Cardiac Cath Procedure Note:  Indication: NSTEMI  Procedures performed:  1) Selective coronary angiography 2) Left heart catheterization 3) Left ventriculogram  Description of procedure:   The risks and indication of the procedure were explained. Consent was signed and placed on the chart. An appropriate timeout was taken prior to the procedure. After a normal Allen's test was confirmed, the right wrist was prepped and draped in the routine sterile fashion and anesthetized with 1% local lidocaine.   A 5 FR arterial sheath was then placed in the right radial artery using a modified Seldinger technique. Systemic heparin was administered. 3mg  IV verapamil was given through the sheath. Standard catheters including a JR4 (cannulated LM and RCA with JR4) and straight pigtail were used. All catheter exchanges were made over a wire.  Complications:  None apparent  Findings:  Ao Pressure:  153/87 (117) LV Pressure:   152/13/22 There was no signficant gradient across the aortic valve on pullback.  Left main:  Normal  LAD: Mild plaque in the distal vessel. Gave off 2 diagonals D1 was small. D2 large - no significant stenosis  LCX: Made up primarily of large OM-1 with no significant stenosis. There was a tiny ramus. AV groove LCX was small.   RCA: Moderate sized. Normal  LV-gram done in the RAO projection: Ejection fraction = 55% no regional wall motion abnormalities. No MR.  Assessment: 1) Minimal non-obstructive coronary plaque 2) Normal LV function 3) Systemic hypertension with elevated LVEDP  Plan/Discussion:  Her coronaries look very good. No obvious culprit for troponin elevation. Will check d-dimer. If positive would get CT scan chest. If d-dimer negative perhaps troponin due to hypertensive stress.  Daniel Bensimhon 5:23 PM

## 2011-12-15 NOTE — Progress Notes (Signed)
PROTOCOL: HEPARIN -CP Admit Complaint: 3d hx of CP  Anticoag:60 y/o female with CP/SOB, troponin 2.04->2.17->1.64. Patient placed on IV heparin, ASA, metoprolol. HL 0.47 today (0.3-0.7 goal ) Heparin rate 1350 unit/hr; CBC stable No bleeding.  Plan; Cont  Heparin at 1350/hr, qAM HL, CBC; Probable CATH today (f/u after cath)

## 2011-12-15 NOTE — Progress Notes (Signed)
Results of LFT's noted (see today's progress note). May be 2/2 to MI but will need to follow. If still elevated in AM, consider holding on statin. Dayna Dunn PA-C

## 2011-12-15 NOTE — Progress Notes (Signed)
   Patient Name: Shelby Wong Date of Encounter: 12/15/2011, 7:30 AM    Subjective  Has mild substernal chest pressure that she feels is hunger. It is different than her presenting intense pain which has completely resolved. No SOB. Didn't sleep well last night.   Objective   Telemetry: NSR/SB Physical Exam: Filed Vitals:   12/15/11 0347  BP: 111/68  Pulse: 55  Temp: 98 F (36.7 C)  Resp: 11   General: Well developed, well nourished, in no acute distress. Head: Normocephalic, atraumatic, sclera non-icteric, no xanthomas, nares are without discharge.  Neck: Negative for carotid bruits. JVD not elevated. Lungs: Clear bilaterally to auscultation without wheezes, rales, or rhonchi. Breathing is unlabored. Heart: RRR S1 S2 without murmurs, rubs, or gallops.  Abdomen: Soft, non-tender, non-distended with normoactive bowel sounds. No hepatomegaly. No rebound/guarding. No obvious abdominal masses. Msk:  Strength and tone appear normal for age. Extremities: No clubbing or cyanosis. No edema.  Distal pedal pulses are 2+ and equal bilaterally. Neuro: Alert and oriented X 3. Moves all extremities spontaneously. Psych:  Responds to questions appropriately with a normal affect.    Intake/Output Summary (Last 24 hours) at 12/15/11 0730 Last data filed at 12/15/11 0700  Gross per 24 hour  Intake 948.75 ml  Output    650 ml  Net 298.75 ml    Labs:  Basename 12/15/11 0504 12/14/11 1200 12/14/11 0013  NA 140 141 --  K 4.0 3.4* --  CL 104 103 --  CO2 28 27 --  GLUCOSE 110* 107* --  BUN 13 14 --  CREATININE 0.76 0.89 --  CALCIUM 9.1 9.1 --  MG -- -- 1.9  PHOS -- -- --    Basename 12/15/11 0455 12/14/11 0612  WBC 6.8 5.9  NEUTROABS -- --  HGB 11.6* 12.4  HCT 36.4 37.9  MCV 92.9 91.8  PLT 187 171    Basename 12/13/11 2118 12/13/11 1600 12/13/11 0905  CKTOTAL 96 124 129  CKMB 7.5* 11.5* 14.8*  TROPONINI 1.64* 2.17* 2.04*   Basename 12/15/11 0455  CHOL 147  HDL 48  LDLCALC 78    TRIG 107  CHOLHDL 3.1    Radiology/Studies:  1. Chest 2 View 12/12/2011  IMPRESSION: No acute disease.  Stable scarring in both lungs.  Original Report Authenticated By: Gwynn Burly, M.D.    Assessment and Plan   1. Chest pain/NSTEMI - continue ASA, BB, heparin gtt. Will add-on LFT's to labs and start statin today.  Plan cath this AM.   2. Hypothyroidism - continue synthroid  3. GERD - stable, continue PPI.  Signed, Ronie Spies PA-C  Patient seen, examined. Available data reviewed. Agree with findings, assessment, and plan as outlined by Ronie Spies, PA-C. I have independently examined the patient and discussed our treatment plan of cardiac cath and possible PCI this morning. We reviewed risks, benefits, and alternatives to cath and PCI in detail and she understands. Pt is on ASA, heparin, and NTG. She has not been preloaded with a thienopyridine and will wait until she undergoes cath and has her coronary anatomy defined since her enzymes are now trending down.  Tonny Bollman, M.D. 12/15/2011 7:50 AM

## 2011-12-16 ENCOUNTER — Inpatient Hospital Stay (HOSPITAL_COMMUNITY): Payer: BC Managed Care – PPO

## 2011-12-16 DIAGNOSIS — I517 Cardiomegaly: Secondary | ICD-10-CM

## 2011-12-16 DIAGNOSIS — I214 Non-ST elevation (NSTEMI) myocardial infarction: Principal | ICD-10-CM

## 2011-12-16 LAB — HEPATIC FUNCTION PANEL
Alkaline Phosphatase: 153 U/L — ABNORMAL HIGH (ref 39–117)
Indirect Bilirubin: 0.7 mg/dL (ref 0.3–0.9)
Total Bilirubin: 1 mg/dL (ref 0.3–1.2)

## 2011-12-16 LAB — CBC
HCT: 35.1 % — ABNORMAL LOW (ref 36.0–46.0)
Hemoglobin: 11.4 g/dL — ABNORMAL LOW (ref 12.0–15.0)
MCH: 30.2 pg (ref 26.0–34.0)
MCHC: 32.5 g/dL (ref 30.0–36.0)
MCV: 92.9 fL (ref 78.0–100.0)

## 2011-12-16 LAB — CREATININE, SERUM
GFR calc Af Amer: >90 mL/min (ref >90)
GFR calc non Af Amer: >90 mL/min (ref >90)

## 2011-12-16 MED ORDER — ALPRAZOLAM 0.25 MG PO TABS
0.2500 mg | ORAL_TABLET | Freq: Two times a day (BID) | ORAL | Status: DC | PRN
  Administered 2011-12-16: 0.25 mg via ORAL
  Filled 2011-12-16: qty 1

## 2011-12-16 MED ORDER — GI COCKTAIL ~~LOC~~
30.0000 mL | Freq: Three times a day (TID) | ORAL | Status: DC | PRN
  Administered 2011-12-16 (×3): 30 mL via ORAL
  Filled 2011-12-16 (×3): qty 30

## 2011-12-16 MED ORDER — ALUM & MAG HYDROXIDE-SIMETH 200-200-20 MG/5ML PO SUSP
30.0000 mL | ORAL | Status: DC | PRN
  Administered 2011-12-16: 30 mL via ORAL
  Filled 2011-12-16: qty 30

## 2011-12-16 MED ORDER — IOHEXOL 350 MG/ML SOLN
100.0000 mL | Freq: Once | INTRAVENOUS | Status: AC | PRN
  Administered 2011-12-16: 100 mL via INTRAVENOUS

## 2011-12-16 NOTE — Progress Notes (Signed)
Subjective:  Had recurrence of chest pain yesterday evening, feels ok today. No dyspnea or other complaints.  Objective:  Vital Signs in the last 24 hours: Temp:  [98 F (36.7 C)-98.6 F (37 C)] 98.6 F (37 C) (02/05 0400) Pulse Rate:  [58-90] 58  (02/05 0413) Resp:  [12-22] 13  (02/05 0413) BP: (112-180)/(62-101) 134/76 mmHg (02/05 0413) SpO2:  [93 %-97 %] 97 % (02/05 0413)  Intake/Output from previous day: 02/04 0701 - 02/05 0700 In: 1557 [P.O.:240; I.V.:1317] Out: 700 [Urine:700]  Physical Exam: Pt is alert and oriented, NAD HEENT: normal Neck: JVP - normal, carotids 2+= without bruits Lungs: CTA bilaterally CV: RRR without murmur or gallop Abd: soft, NT, Positive BS, no hepatomegaly Ext: no C/C/E, distal pulses intact and equal Skin: warm/dry no rash   Lab Results:  Basename 12/16/11 0555 12/15/11 1933  WBC 5.5 8.2  HGB 11.4* 12.2  PLT 179 200    Basename 12/15/11 1933 12/15/11 0504 12/14/11 1200  NA -- 140 141  K -- 4.0 3.4*  CL -- 104 103  CO2 -- 28 27  GLUCOSE -- 110* 107*  BUN -- 13 14  CREATININE 0.68 0.76 --    Basename 12/13/11 2118 12/13/11 1600  TROPONINI 1.64* 2.17*    Cardiac Studies: cath note reviewed  Tele: sinus rhythm  Assessment/Plan:  1. NSTEMI - difficult to explain event with minor CAD noted at cath and also normal LV function. Had recurrent pain last night. D-Dimer is negative. Recommend 2D echo today to rule out effusion or other occult cause of cardiac enzyme rise. 2. GERD 3. Dispo - observe today, mobilize, continue ASA, statin, and metoprolol. If echo ok and no recurrent pain will plan discharge in am.  Tonny Bollman, M.D. 12/16/2011, 7:53 AM

## 2011-12-16 NOTE — Progress Notes (Signed)
Discussed findings of 2D echo with Dr. Excell Seltzer including elevated PA pressure although normal LV/RV. LFT's have also continued to rise (only got 1 dose of statin, and LFT's were on their way up yesterday). D/c'd statin. As stated before, etiology of troponins not entirely clear. Per discussion, we will proceed with CT chest to rule out PE (will also allow [place] to look at thoracic aorta) as well as abdominal ultrasound. This plan was conveyed to the patient and nursing staff.  Dayna Dunn PA-C

## 2011-12-16 NOTE — Progress Notes (Signed)
  Echocardiogram 2D Echocardiogram has been performed.  Jorje Guild [facility] 12/16/2011, 10:27 AM

## 2011-12-16 NOTE — Progress Notes (Signed)
Pt. c/o chest pressure pain post- cardiac cath; morphine sulfate 4 mg i.v. q 2 prn given; stat Ekg; 02 therapy initiated @ 2 L/[place]. VS stable ; no EKG changes ; Dr. Terressa Koyanagi notified; Will cont. To monitor pt.

## 2011-12-17 DIAGNOSIS — I214 Non-ST elevation (NSTEMI) myocardial infarction: Secondary | ICD-10-CM | POA: Diagnosis present

## 2011-12-17 LAB — COMPREHENSIVE METABOLIC PANEL
ALT: 151 U/L — ABNORMAL HIGH (ref 0–35)
AST: 131 U/L — ABNORMAL HIGH (ref 0–37)
Albumin: 3.2 g/dL — ABNORMAL LOW (ref 3.5–5.2)
Alkaline Phosphatase: 139 U/L — ABNORMAL HIGH (ref 39–117)
Potassium: 3.5 mEq/L (ref 3.5–5.1)
Sodium: 141 mEq/L (ref 135–145)
Total Protein: 6.6 g/dL (ref 6.0–8.3)

## 2011-12-17 LAB — CBC
HCT: 37.1 % (ref 36.0–46.0)
Hemoglobin: 11.9 g/dL — ABNORMAL LOW (ref 12.0–15.0)
MCH: 29.8 pg (ref 26.0–34.0)
MCHC: 32.1 g/dL (ref 30.0–36.0)
MCV: 92.8 fL (ref 78.0–100.0)
Platelets: 196 K/uL (ref 150–400)
RBC: 4 MIL/uL (ref 3.87–5.11)
RDW: 13.7 % (ref 11.5–15.5)
WBC: 6.3 K/uL (ref 4.0–10.5)

## 2011-12-17 MED ORDER — OMEPRAZOLE 20 MG PO CPDR: 20.0000 mg | DELAYED_RELEASE_CAPSULE | Freq: Two times a day (BID) | ORAL | Status: DC

## 2011-12-17 MED ORDER — IBUPROFEN 400 MG PO TABS: 400.0000 mg | ORAL_TABLET | Freq: Three times a day (TID) | ORAL | Status: DC

## 2011-12-17 MED ORDER — IBUPROFEN 400 MG PO TABS
400.0000 mg | ORAL_TABLET | Freq: Three times a day (TID) | ORAL | Status: DC
  Administered 2011-12-17 (×2): 400 mg via ORAL
  Filled 2011-12-17 (×4): qty 1

## 2011-12-17 NOTE — Discharge Summary (Signed)
CARDIOLOGY DISCHARGE SUMMARY   Patient ID: Shelby Wong MRN: [id number] DOB/AGE: [birth year] 61 y.o.  Admit date: 12/12/2011 Discharge date: 12/17/2011  Primary Discharge Diagnosis:   . NSTEMI, initial episode of care   Secondary Discharge Diagnosis:  Patient Active Problem List  Diagnoses  . HYPOTHYROIDISM  . AUTOIMMUNE THYROIDITIS  . VITAMIN D DEFICIENCY  . HYPERLIPIDEMIA  . OBESITY  . DEPRESSION  . ALLERGIC RHINITIS  . GERD  . HIATAL HERNIA  . DIVERTICULOSIS, MILD  . POLYP, ANAL AND RECTAL  . ARTHRITIS  . SHOULDER PAIN, RIGHT  . Backache, unspecified  . HEADACHE  . URINARY FREQUENCY  . NONSPECIFIC ABNORM RESULTS THYROID FUNCT STUDY  . ABNORMAL FINDINGS, ELEVATED BP W/O HTN  . CHICKENPOX, HX OF  . Preventative health care  . IBS (irritable bowel syndrome)  . RUQ pain  . Cardiac enzymes elevated       Consults:   Procedures: 1) Selective coronary angiography  2) Left heart catheterization  3) Left ventriculogram 4) 2D echocardiogram 5) CTA chest  Hospital Course: Ms. not is a 61 year old female with no previous history of coronary artery disease. She had chest pain that waxed and waned but was continuous for 3 days. She came to the emergency room and was admitted for further evaluation and treatment.  Her cardiac enzymes were elevated indicating a non-ST segment elevation MI. Initially because of the abnormal cardiac enzymes, she was started on a statin. However, her LFTs which have been mildly elevated became more elevated. Statin was discontinued. Her chest pain was treated with IV nitroglycerin and narcotics. She was taken to the cath lab on 12/15/2011.  The cardiac catheterization showed minimal nonobstructive coronary plaque with normal LV function and systemic hypertension with elevated left ventricular end-diastolic pressure. A 2-D echocardiogram and a CT angiogram of the chest were ordered.  A CT angiogram of the chest showed no pulmonary embolism but an  enlarged main pulmonary artery, concerning for pulmonary artery hypertension. The 2-D echocardiogram showed normal left ventricular function and normal right ventricular function. However, her PAS was elevated at 46. Because of the abnormal LFTs, an abdominal ultrasound was done. It showed hepatic steatosis but no critical abnormalities. She had a GI cocktail which may have helped her chest pain and also pain control medications may have helped. Her pain was possibly worse with deep inspiration.  On 12/17/2011, she was evaluated by Dr. Excell Seltzer. He recommended a short course of end-stage since she was still having pain. Aggressive risk factor reduction measures were also recommended. This could possibly represent mild pericarditis but without typical ECG changes. 12/17/2011 p.m., [name] was ambulating with no new symptoms and considered stable for discharge, to followup as an outpatient.  Labs:   Lab Results  Component Value Date   WBC 6.3 12/17/2011   HGB 11.9* 12/17/2011   HCT 37.1 12/17/2011   MCV 92.8 12/17/2011   PLT 196 12/17/2011    Lab 12/17/11 0530  NA 141  K 3.5  CL 103  CO2 29  BUN 12  CREATININE 0.75  CALCIUM 9.2  PROT 6.6  BILITOT 0.7  ALKPHOS 139*  ALT 151*  AST 131*  GLUCOSE 104*   No results found for this basename: CKTOTAL:3,CKMB:3,CKMBINDEX:3,TROPONINI:3 in the last 72 hours Lipid Panel     Component Value Date/Time   CHOL 147 12/15/2011 0455   TRIG 107 12/15/2011 0455   HDL 48 12/15/2011 0455   CHOLHDL 3.1 12/15/2011 0455   VLDL 21 12/15/2011 0455  LDLCALC 78 12/15/2011 0455    No results found for this basename: probnp   No results found for this basename: INR in the last 72 hours     Radiology: Ct Angio Chest W/cm &/or Wo Cm  12/16/2011  *RADIOLOGY REPORT*  Clinical Data: Chest pressure, shortness of breath, normal cardiac catheterization, evaluate for PE  CT ANGIOGRAPHY CHEST  Technique:  Multidetector CT imaging of the chest using the standard protocol during bolus  administration of intravenous contrast. Multiplanar reconstructed images including MIPs were obtained and reviewed to evaluate the vascular anatomy.  Contrast: OMNIPAQUE IOHEXOL 350 MG/ML IV SOLN  Comparison: Chest radiographs dated 12/12/2011  Findings: No evidence of pulmonary embolism.  Scattered areas of patchy atelectasis / scarring with mosaic attenuation in the bilateral lungs.  No suspicious pulmonary nodules.  No pleural effusion or pneumothorax.  The heart is mildly enlarged.  No pericardial effusion.  Mild coronary atherosclerosis (series 4/image 50).  Mild atherosclerotic calcifications of the aortic arch.  Enlargement of the main pulmonary artery, raising the possibility of pulmonary arterial hypertension.  No suspicious mediastinal, hilar, or axillary lymphadenopathy.  Visualized upper abdomen is notable for cholecystectomy clips and probable mild hepatic steatosis.  Degenerative changes of the visualized thoracolumbar spine.  IMPRESSION: No evidence of pulmonary embolism.  Mild cardiomegaly.  Enlargement of the main pulmonary artery, raising the possibility of pulmonary arterial hypertension.  Original Report Authenticated By: Charline Bills, M.D.   US Abdomen Complete  12/16/2011  *RADIOLOGY REPORT*  Clinical Data:  Elevated liver function tests.  COMPLETE ABDOMINAL ULTRASOUND  Comparison:  Prior ultrasound 11/23/2007. CT scan was performed through the chest on 12/16/2011 which included the upper abdomen.  Findings:  Gallbladder:  Cholecystectomy.  Common bile duct:  Normal measuring 4.1 mm.  Liver:  No focal lesion identified.  Increased echogenicity consistent with hepatic steatosis.  IVC:  Appears normal.  Pancreas:  No focal abnormality seen.  Spleen:  Normal measuring 8.0 cm in length.  Right Kidney:  Normal measuring 11.5 cm in length.  Left Kidney:  Normal measuring 11.9 cm in length.  Abdominal aorta:  No aneurysm identified.  IMPRESSION: Status post cholecystectomy with no evidence  for biliary ductal dilatation.  Increased echogenicity of the liver consistent with hepatic steatosis.  No focal hepatic lesions are seen.  Original Report Authenticated By: Elsie Stain, M.D.   EKG: NSR, rate 83, no acute ischemic changes   Echo: 12/16/2011 Study Conclusions - Left ventricle: The cavity size was normal. Wall thickness was increased in a pattern of mild LVH. Systolic function was normal. The estimated ejection fraction was in the range of 55% to 60%. Wall motion was normal; there were no regional wall motion abnormalities. - Pulmonary arteries: Systolic pressure was mildly to moderately increased. PA peak pressure: 46mm Hg (S).    FOLLOW UP PLANS AND APPOINTMENTS Discharge Orders    Future Appointments: Provider: Department: Dept Phone: Center:   12/24/2011 2:00 PM Beatrice Lecher, PA [facility] [phone] [id number]     Medication List  As of 12/17/2011  1:56 PM   TAKE these medications         CLARITIN 10 MG Caps   Generic drug: Loratadine   Take by mouth. As needed      FLUoxetine 10 MG capsule   Commonly known as: PROZAC   TAKE ONE CAPSULE BY MOUTH EVERY DAY      ibuprofen 400 MG tablet   Commonly known as: ADVIL,MOTRIN   Take 1  tablet (400 mg total) by mouth 3 (three) times daily.      levothyroxine 100 MCG tablet   Commonly known as: SYNTHROID, LEVOTHROID   TAKE 1 TABLET BY MOUTH EVERY DAY      magic mouthwash w/lidocaine Soln   Take 5 mLs by mouth 4 (four) times daily.      omeprazole 20 MG capsule   Commonly known as: PRILOSEC   Take 1 capsule (20 mg total) by mouth 2 (two) times daily.      TYLENOL EXTRA STRENGTH PO   Take 1 tablet by mouth daily as needed. For pain      Vitamin D 400 UNITS capsule   Take 400 Units by mouth daily.           Follow-up Information    Follow up with Lorretta Harp, MD in 2 weeks. (As needed)       Follow up with Tereso Newcomer, PA. (February 13th at 2pm) for Dr Otila Back  information:   [address] [place] [address] [phone]          BRING ALL MEDICATIONS WITH YOU TO FOLLOW UP APPOINTMENTS  Time spent with patient to include physician time: 45 min Signed: Theodore Demark 12/17/2011, 1:56 PM Co-Sign MD

## 2011-12-17 NOTE — Progress Notes (Signed)
Ms. Pilger was given all d/c instructions. Pt verbalized understanding of all information given and will comply. IVs removed. Pt taken to d/c area and assisted into vehicle.

## 2011-12-17 NOTE — Progress Notes (Signed)
Patient Name: Shelby Wong Date of Encounter: 12/17/2011  Principal Problem: NSTEMI Active Problems:  HYPERLIPIDEMIA  Cardiac enzymes elevated  SUBJECTIVE: Still c/o CP, 1-2/10, maybe worse with deep inspiration, previously may have been helped by GI cocktail but not last night. Otherwise, feels well. Family MD has mentioned elevated CBG to her in the past, but was not very high.   OBJECTIVE  Filed Vitals:   12/16/11 2344 12/16/11 2345 12/17/11 0400 12/17/11 0426  BP: 118/59   141/62  Pulse:      Temp:  98.3 F (36.8 C) 98.6 F (37 C)   TempSrc:  Oral Oral   Resp:      Height:      Weight:      SpO2:  94% 97%     Intake/Output Summary (Last 24 hours) at 12/17/11 0742 Last data filed at 12/16/11 2300  Gross per 24 hour  Intake    960 ml  Output      0 ml  Net    960 ml   Weight change:  Wt Readings from Last 3 Encounters:  12/13/11 227 lb 15.3 oz (103.4 kg)  12/13/11 227 lb 15.3 oz (103.4 kg)  11/20/11 230 lb (104.327 kg)     PHYSICAL EXAM  General: Well developed, well nourished, in no acute distress. Head: Normocephalic, atraumatic.  Neck: Supple without bruits, no JVD. Lungs:  Resp regular and unlabored, CTA. Heart: RRR, S1, S2, no S3, S4, or murmurs. Abdomen: Soft, non-tender, non-distended, BS + x 4.  Extremities: No clubbing, cyanosis, no edema.  Neuro: Alert and oriented X 3. Moves all extremities spontaneously. Psych: Normal affect.  LABS:  CBC: Basename 12/17/11 0530 12/16/11 0555  WBC 6.3 5.5  NEUTROABS -- --  HGB 11.9* 11.4*  HCT 37.1 35.1*  MCV 92.8 92.9  PLT 196 179   INR: Basename 12/14/11 1200  INR 0.94   Basic Metabolic Panel: Basename 12/17/11 0530 12/16/11 1539 12/15/11 0504  NA 141 -- 140  K 3.5 -- 4.0  CL 103 -- 104  CO2 29 -- 28  GLUCOSE 104* -- 110*  BUN 12 -- 13  CREATININE 0.75 0.75 --  CALCIUM 9.2 -- 9.1  MG -- -- --  PHOS -- -- --   Liver Function Tests: Basename 12/17/11 0530 12/16/11 0555  AST 131* 451*    ALT 151* 244*  ALKPHOS 139* 153*  BILITOT 0.7 1.0  PROT 6.6 6.3  ALBUMIN 3.2* 3.1*    Cardiac Enzymes: Lab Results  Component Value Date   CKTOTAL 96 12/13/2011   CKMB 7.5* 12/13/2011   TROPONINI 1.64* 12/13/2011    Dimer: Basename 12/15/11 1712  DDIMER 0.22   Fasting Lipid Panel: Basename 12/15/11 0455  CHOL 147  HDL 48  LDLCALC 78  TRIG 107  CHOLHDL 3.1  LDLDIRECT --   Thyroid Function Tests: Basename 12/15/11 0455  TSH 2.833  T4TOTAL --  T3FREE --  THYROIDAB --    Echo: 12/16/2011 Study Conclusions - Left ventricle: The cavity size was normal. Wall thickness was increased in a pattern of mild LVH. Systolic function was normal. The estimated ejection fraction was in the range of 55% to 60%. Wall motion was normal; there were no regional wall motion abnormalities. - Pulmonary arteries: Systolic pressure was mildly to moderately increased. PA peak pressure: 46mm Hg (S).   TELE:  SR  Radiology/Studies: Dg Chest 2 View 12/12/2011  *RADIOLOGY REPORT*  Clinical Data: Chest pain.  CHEST - 2 VIEW  Comparison: Radiograph  dated 04/09/2011  Findings: The heart size and vascularity are normal.  The patient has patchy areas of scarring in both lungs, unchanged since the prior study.  No acute infiltrates or effusions. No acute osseous abnormalities.  IMPRESSION: No acute disease.  Stable scarring in both lungs.  Original Report Authenticated By: Gwynn Burly, M.D.   Ct Angio Chest W/cm &/or Wo Cm 12/16/2011  *RADIOLOGY REPORT*  Clinical Data: Chest pressure, shortness of breath, normal cardiac catheterization, evaluate for PE  CT ANGIOGRAPHY CHEST  Technique:  Multidetector CT imaging of the chest using the standard protocol during bolus administration of intravenous contrast. Multiplanar reconstructed images including MIPs were obtained and reviewed to evaluate the vascular anatomy.  Contrast: OMNIPAQUE IOHEXOL 350 MG/ML IV SOLN  Comparison: Chest radiographs dated  12/12/2011  Findings: No evidence of pulmonary embolism.  Scattered areas of patchy atelectasis / scarring with mosaic attenuation in the bilateral lungs.  No suspicious pulmonary nodules.  No pleural effusion or pneumothorax.  The heart is mildly enlarged.  No pericardial effusion.  Mild coronary atherosclerosis (series 4/image 50).  Mild atherosclerotic calcifications of the aortic arch.  Enlargement of the main pulmonary artery, raising the possibility of pulmonary arterial hypertension.  No suspicious mediastinal, hilar, or axillary lymphadenopathy.  Visualized upper abdomen is notable for cholecystectomy clips and probable mild hepatic steatosis.  Degenerative changes of the visualized thoracolumbar spine.  IMPRESSION: No evidence of pulmonary embolism.  Mild cardiomegaly.  Enlargement of the main pulmonary artery, raising the possibility of pulmonary arterial hypertension.  Original Report Authenticated By: Charline Bills, M.D.   US Abdomen Complete 12/16/2011  *RADIOLOGY REPORT*  Clinical Data:  Elevated liver function tests.  COMPLETE ABDOMINAL ULTRASOUND  Comparison:  Prior ultrasound 11/23/2007. CT scan was performed through the chest on 12/16/2011 which included the upper abdomen.  Findings:  Gallbladder:  Cholecystectomy.  Common bile duct:  Normal measuring 4.1 mm.  Liver:  No focal lesion identified.  Increased echogenicity consistent with hepatic steatosis.  IVC:  Appears normal.  Pancreas:  No focal abnormality seen.  Spleen:  Normal measuring 8.0 cm in length.  Right Kidney:  Normal measuring 11.5 cm in length.  Left Kidney:  Normal measuring 11.9 cm in length.  Abdominal aorta:  No aneurysm identified.  IMPRESSION: Status post cholecystectomy with no evidence for biliary ductal dilatation.  Increased echogenicity of the liver consistent with hepatic steatosis.  No focal hepatic lesions are seen.  Original Report Authenticated By: Elsie Stain, M.D.    Current Medications:     . aspirin   81 mg Oral Daily  . FLUoxetine  10 mg Oral Daily  . heparin  5,000 Units Subcutaneous Q8H  . levothyroxine  100 mcg Oral QAC breakfast  . metoprolol tartrate  25 mg Oral BID  . pantoprazole  40 mg Oral BID AC  . sodium chloride  3 mL Intravenous Q12H  . DISCONTD: rosuvastatin  40 mg Oral Daily    ASSESSMENT AND PLAN: 1. NSTEMI - minimal CAD at cath, cont ASA/BB but no statin with elevated LFTs.  2. Chest pain - try NSAIDs but watch the dose with abnl LFTs  3. Anbl LFTs - trending down, no stating, follow  4. Hyperglycemia - encourage low-carb diet, f/u with primary MD.   5. Plan - d/c when medically stable.    Signed, Theodore Demark , PA-C 7:42 AM 12/17/2011  Patient seen, examined. Available data reviewed. Agree with findings, assessment, and plan as outlined by Theodore Demark, PA-C.  Pt has undergone extensive studies, all unrevealing as to cause of her NSTEMI. She now has pleuritic pain. I would favor a short course of NSAIDs to see if she has symptomatic relief since she is still having pain. Will arrange appropriate follow-up and continue with aggressive risk reduction measures. Question whether this represents myopericarditis without typical EKG changes?  Tonny Bollman, M.D. 12/17/2011 9:54 AM

## 2011-12-18 ENCOUNTER — Encounter: Payer: Self-pay | Admitting: *Deleted

## 2011-12-18 ENCOUNTER — Telehealth: Payer: Self-pay | Admitting: Gastroenterology

## 2011-12-18 MED ORDER — SUCRALFATE 1 GM/10ML PO SUSP: ORAL | Status: DC

## 2011-12-18 MED ORDER — ESOMEPRAZOLE MAGNESIUM 40 MG PO CPDR: DELAYED_RELEASE_CAPSULE | ORAL | Status: DC

## 2011-12-18 NOTE — Telephone Encounter (Signed)
Last OV with Dr Jarold Motto 03/28/2008 for f/u with ECL done in March, 2009;  EGD [facility] with normal bx, COLON normal except diverticulosis. Pt was to take Nexium daily, but sometime over the years, she was switched to Omeprazole BID- Dr Fabian Sharp? Pt reports burning in her stomach and esophagus that got worse last Friday, so she called 911. EKG normal, but BP out the roof so she went to ER. CK MB + and dx was mild MI; she was d/c with omeprazole BID. Pt reports the burning remains and increases after she eats. D/t inflammation in the heart wall, she is to take Ibuprofen 400mg  tid with meals.  Earliest appt for her with you is 12/23/11. In the meantime, can we give her Nexium samples until appt or Carafate? Please advise. Thanks.

## 2011-12-18 NOTE — Telephone Encounter (Signed)
Bid nexium and prn carafate suspension are ok

## 2011-12-18 NOTE — Telephone Encounter (Signed)
Informed pt I will leave samples of Nexium at the front desk for her and I will order the Carafate for her. Dr Jarold Motto stated it's ok to take Ibuprofen as ordered since she's on Nexium and Carafate; pt stated understanding.

## 2011-12-19 NOTE — Discharge Summary (Signed)
Please see my progress note the same date. Agree with documentation as outlined.

## 2011-12-20 ENCOUNTER — Encounter (HOSPITAL_COMMUNITY): Payer: Self-pay | Admitting: Emergency Medicine

## 2011-12-23 ENCOUNTER — Ambulatory Visit (INDEPENDENT_AMBULATORY_CARE_PROVIDER_SITE_OTHER): Payer: BC Managed Care – PPO | Admitting: Gastroenterology

## 2011-12-23 ENCOUNTER — Encounter: Payer: Self-pay | Admitting: Gastroenterology

## 2011-12-23 ENCOUNTER — Other Ambulatory Visit (INDEPENDENT_AMBULATORY_CARE_PROVIDER_SITE_OTHER): Payer: BC Managed Care – PPO

## 2011-12-23 DIAGNOSIS — R7989 Other specified abnormal findings of blood chemistry: Secondary | ICD-10-CM

## 2011-12-23 DIAGNOSIS — K219 Gastro-esophageal reflux disease without esophagitis: Secondary | ICD-10-CM

## 2011-12-23 DIAGNOSIS — E669 Obesity, unspecified: Secondary | ICD-10-CM

## 2011-12-23 DIAGNOSIS — Z9049 Acquired absence of other specified parts of digestive tract: Secondary | ICD-10-CM

## 2011-12-23 DIAGNOSIS — R0789 Other chest pain: Secondary | ICD-10-CM | POA: Insufficient documentation

## 2011-12-23 DIAGNOSIS — Z9089 Acquired absence of other organs: Secondary | ICD-10-CM

## 2011-12-23 LAB — LIPASE: Lipase: 41 U/L (ref 11.0–59.0)

## 2011-12-23 LAB — HEPATIC FUNCTION PANEL
Alkaline Phosphatase: 86 U/L (ref 39–117)
Bilirubin, Direct: 0.1 mg/dL (ref 0.0–0.3)

## 2011-12-23 LAB — IBC PANEL
Iron: 90 ug/dL (ref 42–145)
Saturation Ratios: 26 % (ref 20.0–50.0)
Transferrin: 247.4 mg/dL (ref 212.0–360.0)

## 2011-12-23 LAB — AMYLASE: Amylase: 61 U/L (ref 27–131)

## 2011-12-23 LAB — VITAMIN B12: Vitamin B-12: 404 pg/mL (ref 211–911)

## 2011-12-23 LAB — SEDIMENTATION RATE: Sed Rate: 81 mm/hr — ABNORMAL HIGH (ref 0–22)

## 2011-12-23 LAB — PROTIME-INR: INR: 1 ratio (ref 0.8–1.0)

## 2011-12-23 MED ORDER — ESOMEPRAZOLE MAGNESIUM 40 MG PO CPDR: 40.0000 mg | DELAYED_RELEASE_CAPSULE | Freq: Every day | ORAL | Status: DC

## 2011-12-23 NOTE — Progress Notes (Signed)
History of Present Illness:  This is a confusing case of a 61 year old Caucasian female with chronic obesity status post cholecystectomy 8 years ago. She's had previous normal liver function tests, but recently was seen in the emergency room by her cardiologist with left precordial chest pain and mildly abnormal cardiac enzymes. She had cardiac catheterization, CT scan, and multiple labs that otherwise were unremarkable except for SGOT and SGPT of approximately 400, decreased to 200 over several days. Upper abdominal ultrasound exam showed no evidence of biliary ductal dilatation, with evidence of fatty infiltration of her liver. She was placed on 2 Advil 3 times a day which caused severe epigastric burning, nausea, increased reflux symptoms. We started Nexium 40 mg twice a day along with liquid Carafate 4 times a day, and her GI complaints have resolved. She has no specific hepatobiliary symptoms such as icterus, clay colored stools, dark urine, fever, chills, or other systemic complaints or viral illness syndrome type illness. Throughout all these problems she had no specific cardiac complaints such as palpitations, shortness of breath, chest pain with breathing, often et Karie Soda. I have looked at her labs and x-rays , extended period of time, and apparently she was discharged with a diagnosis of non-ST wave MI. However, the patient is adamant that she did not have a cardiac event per her cardiology physicians. Is not on any anticoagulation.  She has been taking when necessary Tylenol but not in excess. She also is on Prozac, Synthroid, and Claritin. She recently treated for a fungal infection with Duke's Magic mouthwash and Diflucan. She denies melena, hematochezia, any history of hepatitis or pancreatitis. She does not smoke or use ethanol. Family history is noncontributory without any known liver disease.  I have reviewed this patient's present history, medical and surgical past history, allergies and  medications.     ROS: The remainder of the 10 point ROS is negative  No Known Allergies Outpatient Prescriptions Prior to Visit  Medication Sig Dispense Refill  . Cholecalciferol (VITAMIN D) 400 UNITS capsule Take 400 Units by mouth daily.        Marland Kitchen FLUoxetine (PROZAC) 10 MG capsule TAKE ONE CAPSULE BY MOUTH EVERY DAY  30 capsule  1  . levothyroxine (SYNTHROID, LEVOTHROID) 100 MCG tablet TAKE 1 TABLET BY MOUTH EVERY DAY  30 tablet  4  . Loratadine (CLARITIN) 10 MG CAPS Take by mouth. As needed      . sucralfate (CARAFATE) 1 GM/10ML suspension Take 10ml by mouth up to 4 times daily for burning.  420 mL  1  . Acetaminophen (TYLENOL EXTRA STRENGTH PO) Take 1 tablet by mouth daily as needed. For pain      . esomeprazole (NEXIUM) 40 MG capsule Take one capsule by mouth twice daily. Morning dose should be at least 30 minutes prior to breakfast.  20 capsule  0  . ibuprofen (ADVIL,MOTRIN) 400 MG tablet Take 1 tablet (400 mg total) by mouth 3 (three) times daily.  30 tablet  0  . Alum & Mag Hydroxide-Simeth (MAGIC MOUTHWASH W/LIDOCAINE) SOLN Take 5 mLs by mouth 4 (four) times daily.  120 mL  0  . omeprazole (PRILOSEC) 20 MG capsule Take 1 capsule (20 mg total) by mouth 2 (two) times daily.       Past Medical History  Diagnosis Date  . Allergic rhinitis   . Depression   . GERD (gastroesophageal reflux disease)   . Headache   . Hiatal hernia   . Diverticulosis of colon (without mention of  hemorrhage)   . Thyroid disease   . IBS (irritable bowel syndrome)   . Anxiety   . Hemorrhoids   . MI (myocardial infarction)    Past Surgical History  Procedure Date  . Cholecystectomy   . Tonsillectomy and adenoidectomy    History   Social History  . Marital Status: Divorced    Spouse Name: N/A    Number of Children: 1  . Years of Education: N/A   Occupational History  . Direct Sales Rep    Social History Main Topics  . Smoking status: Former Smoker    Quit date: 09/11/2006  . Smokeless  tobacco: Never Used  . Alcohol Use: Yes     occassional  . Drug Use: No  . Sexually Active: None   Other Topics Concern  . None   Social History Narrative   Occupation: Airline pilot center   40 hours per week.HH of 1DivorcedRegular exercise- noWas caretaker for mom who died in 2010-01-10G1P10 Caffeine drinks daily    Family History  Problem Relation Age of Onset  . Arthritis Mother     osteo  . Breast cancer Mother   . Coronary artery disease Mother   . Other Mother     hearing loss  . Diabetes Brother     50 s  . Thyroid disease Mother   . Colon cancer Neg Hx         Physical Exam: General well developed well nourished patient in no acute distress, appearing her stated age,obese with BMI of 38 , blood pressure today 138/80 and pulse is 64 and regular. Eyes PERRLA, no icterus, fundoscopic exam per opthamologist Skin no lesions noted Neck supple, no adenopathy, no thyroid enlargement, no tenderness Chest clear to percussion and auscultation Heart no significant murmurs, gallops or rubs noted Abdomen no hepatosplenomegaly masses or tenderness, BS normal.  Extremities no acute joint lesions, edema, phlebitis or evidence of cellulitis. Neurologic patient oriented x 3, cranial nerves intact, no focal neurologic deficits noted. Psychological mental status normal and normal affect.  Assessment and plan: Probable viral pericarditis and possible hepatitis all of which appear to be resolving at this time. Her liver function tests one year ago were normal. Is also possible that she had a drug reaction to Diflucan, rule out autoimmune presentation.  I've ordered repeat liver function tests, sedimentation rate, CRP, and ANA. Also in the past there has been some question of celiac disease, and we have ordered repeat celiac serologies. She is to stop the acetaminophen, ibuprofen, and to continue daily Nexium with liquid Carafate a.c. and each bedtime. We'll see her in 2  weeks and decide if we need to repeat her endoscopic exam. Exam today showed no hepatosplenomegaly, evidence of chronic liver disease, or cardiopulmonary abnormalities. She has appointment to see cardiology tomorrow for clarification for multiple cardiac tests and evaluations. Patient is obese and probably has some mild fatty infiltration of her liver, but this would not explain acute transaminase elevations. Encounter Diagnoses  Name Primary?  . Esophageal reflux   . Elevated LFTs

## 2011-12-23 NOTE — Patient Instructions (Addendum)
Please go to the basement today for your labs.  Stop all Tylenol, Advil. Continue Nexium once a day, samples Given.  Make follow up office visit in 2-3 weeks.

## 2011-12-24 ENCOUNTER — Telehealth: Payer: Self-pay | Admitting: *Deleted

## 2011-12-24 ENCOUNTER — Encounter: Payer: Self-pay | Admitting: Physician Assistant

## 2011-12-24 ENCOUNTER — Ambulatory Visit (INDEPENDENT_AMBULATORY_CARE_PROVIDER_SITE_OTHER): Payer: BC Managed Care – PPO | Admitting: Physician Assistant

## 2011-12-24 VITALS — BP 136/74 | HR 79 | Ht 65.0 in | Wt 230.0 lb

## 2011-12-24 DIAGNOSIS — I2789 Other specified pulmonary heart diseases: Secondary | ICD-10-CM

## 2011-12-24 DIAGNOSIS — R7989 Other specified abnormal findings of blood chemistry: Secondary | ICD-10-CM

## 2011-12-24 DIAGNOSIS — I214 Non-ST elevation (NSTEMI) myocardial infarction: Secondary | ICD-10-CM

## 2011-12-24 DIAGNOSIS — I272 Pulmonary hypertension, unspecified: Secondary | ICD-10-CM | POA: Insufficient documentation

## 2011-12-24 LAB — CELIAC PANEL 10
Endomysial Screen: NEGATIVE
Gliadin IgA: 7.2 U/mL (ref <20)
Gliadin IgG: 21.6 U/mL — ABNORMAL HIGH (ref <20)
Tissue Transglut Ab: 6 U/mL (ref <20)

## 2011-12-24 NOTE — Assessment & Plan Note (Signed)
Follow-up with GI 

## 2011-12-24 NOTE — Assessment & Plan Note (Signed)
Given overall findings from her cardiac cath and echo, her presenting picture appears to be that of a type 2 NSTEMI, likely due to myopericarditis.  Her recent elevated ESR and CRP would coincide with this.  I discussed her case with Dr. Tonny Bollman.  We can see her back on a PRN basis.  She should keep follow up with her GI given her elevated LFTs.  I have also asked her to follow up with her PCP.

## 2011-12-24 NOTE — Assessment & Plan Note (Signed)
She has no dyspnea.  I will have her repeat an echo in 6 mos.

## 2011-12-24 NOTE — Telephone Encounter (Signed)
Message copied by Leonette Monarch on Wed Dec 24, 2011  9:21 AM ------      Message from: Mardella Layman      Created: Tue Dec 23, 2011  5:16 PM       LFT'S NOW NORMAL...MARKDEDLY ELEVATED CRP AND SED RATE C/W ACUTE INFLAMMATORY PROCESS.Marland KitchenPLEASE SEE MY NOTE....ANA PENDING

## 2011-12-24 NOTE — Telephone Encounter (Signed)
Pt aware and I will call her with sprue panel results when they come in

## 2011-12-24 NOTE — Patient Instructions (Signed)
Your physician recommends that you schedule a follow-up appointment as needed with Dr Cooper.    

## 2011-12-24 NOTE — Progress Notes (Signed)
[address] [place], [place]  [address] Phone: [phone] Fax:  [phone]  Date:  12/24/2011   Name:  Shelby Wong       DOB:  17-Aug-1951 MRN:  [id number]  PCP:  Dr. Fabian Sharp Primary Cardiologist:  Dr. Tonny Bollman  Primary Electrophysiologist:  None    History of Present Illness: Shelby Wong is a 61 y.o. female who presents for post hospital follow up.  She was admitted 2/1-2/6 with an NSTEMI.  She presented with 3 days of chest pain.  Troponin peaked at 2.04.  LFTs were noted to be elevated and statin therapy was stopped.  She underwent LHC 12/15/11: mild LAD plaque, EF 55%.  Chest CT 12/16/11: no pulmo embolus, mild CM, enlarged main pulmonary artery concerning for pulmo arterial HTN.  Echo 12/16/11: mild LVH, EF 55-60%, PASP 46.  LFTs continued to climb (AST 136 => 451 => 131; ALT 115 => 244 => 151).  Abd u/s: fatty liver, s/p cholecystectomy, no focal lesions.  Etiology for presenting symptoms were not clear.  It was questioned if this was a picture of myopericarditis without typical EKG changes.  A short course of NSAIDs was recommended.  Labs: Hgb 11.9, K 3.5, creatinine 0.75, TC 147, TG 107, HDL 48, LDL 78, TSH 2.833.  Since d/c she has seen Dr. Jarold Motto with GI.  Follow up labs indicate normalized LFTs and CRP and ESR were elevated.  She has follow up with GI in a couple weeks.  Today, she notes she is doing much better.  The patient denies chest pain, shortness of breath, syncope, orthopnea, PND or significant pedal edema.  She had a lot of epigastric pain when she went home but this is much better now.    Past Medical History  Diagnosis Date  . Allergic rhinitis   . Depression   . GERD (gastroesophageal reflux disease)   . Headache   . Hiatal hernia   . Diverticulosis of colon (without mention of hemorrhage)   . Thyroid disease   . IBS (irritable bowel syndrome)   . Anxiety   . Hemorrhoids   . MI (myocardial infarction)     Current Outpatient  Prescriptions  Medication Sig Dispense Refill  . Cholecalciferol (VITAMIN D) 400 UNITS capsule Take 400 Units by mouth daily.        Marland Kitchen esomeprazole (NEXIUM) 40 MG capsule Take 1 capsule (40 mg total) by mouth daily.  30 capsule  0  . FLUoxetine (PROZAC) 10 MG capsule TAKE ONE CAPSULE BY MOUTH EVERY DAY  30 capsule  1  . levothyroxine (SYNTHROID, LEVOTHROID) 100 MCG tablet TAKE 1 TABLET BY MOUTH EVERY DAY  30 tablet  4  . Loratadine (CLARITIN) 10 MG CAPS Take by mouth. As needed      . sucralfate (CARAFATE) 1 GM/10ML suspension Take 10ml by mouth up to 4 times daily for burning.  420 mL  1    Allergies: No Known Allergies  History  Substance Use Topics  . Smoking status: Former Smoker    Quit date: 09/11/2006  . Smokeless tobacco: Never Used  . Alcohol Use: Yes     occassional     PHYSICAL EXAM: VS:  BP 136/74  Pulse 79  Ht 5\' 5"  (1.651 m)  Wt 230 lb (104.327 kg)  BMI 38.27 kg/m2 Well nourished, well developed, in no acute distress HEENT: normal Neck: no JVD Cardiac:  normal S1, S2; RRR; no murmur Lungs:  clear to auscultation bilaterally, no wheezing,  rhonchi or rales Abd: soft, nontender, no hepatomegaly Ext: no edema; right wrist without hematoma or bruit Skin: warm and dry Neuro:  CNs 2-12 intact, no focal abnormalities noted  EKG:  NSR, HR 79, normal axis, no ischemic changes  ALT  Date/Time Value Range Status  12/23/2011  2:24 PM 30  0-35 (U/L) Final  12/17/2011  5:30 AM 151* 0-35 (U/L) Final  12/16/2011  5:55 AM 244* 0-35 (U/L) Final  12/15/2011  5:04 AM 115* 0-35 (U/L) Final     AST  Date/Time Value Range Status  12/23/2011  2:24 PM 21  0-37 (U/L) Final  12/17/2011  5:30 AM 131* 0-37 (U/L) Final  12/16/2011  5:55 AM 451* 0-37 (U/L) Final  12/15/2011  5:04 AM 136* 0-37 (U/L) Final     Lab Results  Component Value Date   ESRSEDRATE 81* 12/23/2011    CRP: 9.850  ASSESSMENT AND PLAN:

## 2011-12-25 ENCOUNTER — Telehealth: Payer: Self-pay | Admitting: *Deleted

## 2011-12-25 NOTE — Telephone Encounter (Signed)
Message copied by Daphine Deutscher on Thu Dec 25, 2011  9:31 AM ------      Message from: Jarold Motto, DAVID R      Created: Tue Dec 23, 2011  5:16 PM       LFT'S NOW NORMAL...MARKDEDLY ELEVATED CRP AND SED RATE C/W ACUTE INFLAMMATORY PROCESS.Marland KitchenPLEASE SEE MY NOTE....ANA PENDING

## 2011-12-25 NOTE — Telephone Encounter (Signed)
Spoke with patient and gave her results as per Dr. Jarold Motto.

## 2012-01-08 ENCOUNTER — Encounter: Payer: Self-pay | Admitting: *Deleted

## 2012-01-12 ENCOUNTER — Other Ambulatory Visit: Payer: Self-pay

## 2012-01-12 ENCOUNTER — Inpatient Hospital Stay (HOSPITAL_COMMUNITY)
Admission: EM | Admit: 2012-01-12 | Discharge: 2012-01-17 | DRG: 125 | Disposition: A | Discharge status: Home / Self Care | Payer: BC Managed Care – PPO | Source: Ambulatory Visit | Attending: Cardiovascular Disease | Admitting: Cardiovascular Disease

## 2012-01-12 ENCOUNTER — Emergency Department (HOSPITAL_COMMUNITY): Payer: BC Managed Care – PPO

## 2012-01-12 ENCOUNTER — Encounter (HOSPITAL_COMMUNITY): Payer: Self-pay | Admitting: Emergency Medicine

## 2012-01-12 DIAGNOSIS — E876 Hypokalemia: Secondary | ICD-10-CM | POA: Diagnosis present

## 2012-01-12 DIAGNOSIS — E782 Mixed hyperlipidemia: Secondary | ICD-10-CM | POA: Diagnosis present

## 2012-01-12 DIAGNOSIS — I214 Non-ST elevation (NSTEMI) myocardial infarction: Secondary | ICD-10-CM

## 2012-01-12 DIAGNOSIS — R079 Chest pain, unspecified: Secondary | ICD-10-CM

## 2012-01-12 DIAGNOSIS — E785 Hyperlipidemia, unspecified: Secondary | ICD-10-CM | POA: Diagnosis present

## 2012-01-12 DIAGNOSIS — J309 Allergic rhinitis, unspecified: Secondary | ICD-10-CM | POA: Diagnosis present

## 2012-01-12 DIAGNOSIS — F329 Major depressive disorder, single episode, unspecified: Secondary | ICD-10-CM | POA: Diagnosis present

## 2012-01-12 DIAGNOSIS — F3289 Other specified depressive episodes: Secondary | ICD-10-CM | POA: Diagnosis present

## 2012-01-12 DIAGNOSIS — I514 Myocarditis, unspecified: Principal | ICD-10-CM | POA: Diagnosis present

## 2012-01-12 DIAGNOSIS — K449 Diaphragmatic hernia without obstruction or gangrene: Secondary | ICD-10-CM | POA: Diagnosis present

## 2012-01-12 DIAGNOSIS — E039 Hypothyroidism, unspecified: Secondary | ICD-10-CM | POA: Diagnosis present

## 2012-01-12 DIAGNOSIS — I252 Old myocardial infarction: Secondary | ICD-10-CM

## 2012-01-12 DIAGNOSIS — K219 Gastro-esophageal reflux disease without esophagitis: Secondary | ICD-10-CM | POA: Insufficient documentation

## 2012-01-12 HISTORY — DX: Hypothyroidism, unspecified: E03.9

## 2012-01-12 HISTORY — DX: Myocarditis, unspecified: I51.4

## 2012-01-12 HISTORY — DX: Elevation of levels of liver transaminase levels: R74.01

## 2012-01-12 HISTORY — DX: Nonspecific elevation of levels of transaminase and lactic acid dehydrogenase (ldh): R74.0

## 2012-01-12 HISTORY — DX: Pulmonary hypertension, unspecified: I27.20

## 2012-01-12 NOTE — ED Notes (Signed)
Dr.Bednar to eval ecg at 21:14

## 2012-01-12 NOTE — ED Notes (Signed)
PT. REPORTS LEFT CHEST PAIN WITH NAUSEA ONSET TODAY ,DENIES SOB , NAUSEA OR DIAPHORESIS.

## 2012-01-13 ENCOUNTER — Ambulatory Visit: Payer: BC Managed Care – PPO | Admitting: Gastroenterology

## 2012-01-13 ENCOUNTER — Encounter (HOSPITAL_COMMUNITY): Payer: Self-pay | Admitting: Physician Assistant

## 2012-01-13 DIAGNOSIS — R079 Chest pain, unspecified: Secondary | ICD-10-CM

## 2012-01-13 LAB — POCT I-STAT, CHEM 8
BUN: 9 mg/dL (ref 6–23)
BUN: 9 mg/dL (ref 6–23)
Calcium, Ion: 1.18 mmol/L (ref 1.12–1.32)
Calcium, Ion: 1.18 mmol/L (ref 1.12–1.32)
Chloride: 105 mEq/L (ref 96–112)
Creatinine, Ser: 0.7 mg/dL (ref 0.50–1.10)
Creatinine, Ser: 0.9 mg/dL (ref 0.50–1.10)
Glucose, Bld: 99 mg/dL (ref 70–99)
Glucose, Bld: 95 mg/dL (ref 70–99)
Hemoglobin: 12.2 g/dL (ref 12.0–15.0)
TCO2: 26 mmol/L (ref 0–100)
TCO2: 27 mmol/L (ref 0–100)

## 2012-01-13 LAB — DIFFERENTIAL
Basophils Relative: 0 % (ref 0–1)
Eosinophils Absolute: 0.1 10*3/uL (ref 0.0–0.7)
Eosinophils Relative: 1 % (ref 0–5)
Lymphs Abs: 1.6 10*3/uL (ref 0.7–4.0)
Monocytes Absolute: 0.6 10*3/uL (ref 0.1–1.0)
Monocytes Relative: 10 % (ref 3–12)
Neutrophils Relative %: 61 % (ref 43–77)

## 2012-01-13 LAB — CBC
HCT: 37 % (ref 36.0–46.0)
Hemoglobin: 12.3 g/dL (ref 12.0–15.0)
MCH: 29.8 pg (ref 26.0–34.0)
MCHC: 33.2 g/dL (ref 30.0–36.0)
MCV: 89.6 fL (ref 78.0–100.0)
RBC: 4.13 MIL/uL (ref 3.87–5.11)

## 2012-01-13 LAB — HEPARIN LEVEL (UNFRACTIONATED): Heparin Unfractionated: 0.95 [IU]/mL — ABNORMAL HIGH (ref 0.30–0.70)

## 2012-01-13 LAB — CARDIAC PANEL(CRET KIN+CKTOT+MB+TROPI)
CK, MB: 16.4 ng/mL (ref 0.3–4.0)
CK, MB: 19.5 ng/mL (ref 0.3–4.0)
CK, MB: 14.9 ng/mL (ref 0.3–4.0)
Relative Index: 10.1 — ABNORMAL HIGH (ref 0.0–2.5)
Total CK: 194 U/L — ABNORMAL HIGH (ref 7–177)
Total CK: 175 U/L (ref 7–177)
Troponin I: 3.99 ng/mL (ref <0.30)

## 2012-01-13 LAB — HEPATIC FUNCTION PANEL
ALT: 76 U/L — ABNORMAL HIGH (ref 0–35)
AST: 254 U/L — ABNORMAL HIGH (ref 0–37)
Albumin: 3.4 g/dL — ABNORMAL LOW (ref 3.5–5.2)
Total Bilirubin: 1.4 mg/dL — ABNORMAL HIGH (ref 0.3–1.2)

## 2012-01-13 LAB — POCT I-STAT TROPONIN I

## 2012-01-13 LAB — CREATININE, SERUM: GFR calc non Af Amer: >90 mL/min (ref >90)

## 2012-01-13 MED ORDER — ASPIRIN 81 MG PO CHEW
324.0000 mg | CHEWABLE_TABLET | ORAL | Status: AC
  Administered 2012-01-13: 324 mg via ORAL
  Filled 2012-01-13: qty 4

## 2012-01-13 MED ORDER — ONDANSETRON HCL 4 MG/2ML IJ SOLN: 4.0000 mg | Freq: Four times a day (QID) | INTRAMUSCULAR | Status: DC | PRN

## 2012-01-13 MED ORDER — GI COCKTAIL ~~LOC~~
30.0000 mL | Freq: Once | ORAL | Status: AC
  Administered 2012-01-13: 30 mL via ORAL
  Filled 2012-01-13: qty 30

## 2012-01-13 MED ORDER — HEPARIN SODIUM (PORCINE) 5000 UNIT/ML IJ SOLN
5000.0000 [IU] | Freq: Three times a day (TID) | INTRAMUSCULAR | Status: DC
  Administered 2012-01-13: 5000 [IU] via SUBCUTANEOUS
  Filled 2012-01-13 (×3): qty 1

## 2012-01-13 MED ORDER — SODIUM CHLORIDE 0.9 % IV SOLN: 250.0000 mL | INTRAVENOUS | Status: DC | PRN

## 2012-01-13 MED ORDER — NITROGLYCERIN 0.4 MG SL SUBL: 0.4000 mg | SUBLINGUAL_TABLET | SUBLINGUAL | Status: DC | PRN

## 2012-01-13 MED ORDER — VITAMIN D 400 UNITS PO CAPS: 400.0000 [IU] | ORAL_CAPSULE | Freq: Every day | ORAL | Status: DC

## 2012-01-13 MED ORDER — SODIUM CHLORIDE 0.9 % IJ SOLN
3.0000 mL | Freq: Two times a day (BID) | INTRAMUSCULAR | Status: DC
  Administered 2012-01-13 – 2012-01-17 (×8): 3 mL via INTRAVENOUS

## 2012-01-13 MED ORDER — SODIUM CHLORIDE 0.9 % IJ SOLN: 3.0000 mL | INTRAMUSCULAR | Status: DC | PRN

## 2012-01-13 MED ORDER — CHOLECALCIFEROL 10 MCG (400 UNIT) PO TABS
400.0000 [IU] | ORAL_TABLET | Freq: Every day | ORAL | Status: DC
  Administered 2012-01-13 – 2012-01-17 (×5): 400 [IU] via ORAL
  Filled 2012-01-13 (×5): qty 1

## 2012-01-13 MED ORDER — ASPIRIN EC 81 MG PO TBEC
81.0000 mg | DELAYED_RELEASE_TABLET | Freq: Every day | ORAL | Status: DC
  Administered 2012-01-14 – 2012-01-16 (×3): 81 mg via ORAL
  Filled 2012-01-13 (×3): qty 1

## 2012-01-13 MED ORDER — MORPHINE SULFATE 2 MG/ML IJ SOLN
2.0000 mg | Freq: Once | INTRAMUSCULAR | Status: AC
  Administered 2012-01-13: 2 mg via INTRAVENOUS
  Filled 2012-01-13: qty 1

## 2012-01-13 MED ORDER — PANTOPRAZOLE SODIUM 40 MG PO TBEC
40.0000 mg | DELAYED_RELEASE_TABLET | Freq: Every day | ORAL | Status: DC
  Administered 2012-01-13 – 2012-01-17 (×5): 40 mg via ORAL
  Filled 2012-01-13 (×3): qty 1

## 2012-01-13 MED ORDER — FLUOXETINE HCL 10 MG PO CAPS
10.0000 mg | ORAL_CAPSULE | Freq: Every day | ORAL | Status: DC
  Administered 2012-01-13 – 2012-01-17 (×5): 10 mg via ORAL
  Filled 2012-01-13 (×5): qty 1

## 2012-01-13 MED ORDER — SUCRALFATE 1 GM/10ML PO SUSP
1.0000 g | Freq: Four times a day (QID) | ORAL | Status: DC
  Administered 2012-01-13 – 2012-01-17 (×13): 1 g via ORAL
  Filled 2012-01-13 (×19): qty 10

## 2012-01-13 MED ORDER — POTASSIUM CHLORIDE CRYS ER 20 MEQ PO TBCR
40.0000 meq | EXTENDED_RELEASE_TABLET | Freq: Once | ORAL | Status: AC
  Administered 2012-01-13: 40 meq via ORAL
  Filled 2012-01-13: qty 2

## 2012-01-13 MED ORDER — LORATADINE 10 MG PO CAPS: 10.0000 mg | ORAL_CAPSULE | Freq: Every day | ORAL | Status: DC | PRN

## 2012-01-13 MED ORDER — ACETAMINOPHEN 325 MG PO TABS: 650.0000 mg | ORAL_TABLET | ORAL | Status: DC | PRN

## 2012-01-13 MED ORDER — HEPARIN BOLUS VIA INFUSION
4000.0000 [IU] | Freq: Once | INTRAVENOUS | Status: AC
  Administered 2012-01-13: 4000 [IU] via INTRAVENOUS
  Filled 2012-01-13: qty 4000

## 2012-01-13 MED ORDER — LORATADINE 10 MG PO TABS
10.0000 mg | ORAL_TABLET | Freq: Every day | ORAL | Status: DC | PRN
Start: 2012-01-13 — End: 2012-01-17
  Filled 2012-01-13: qty 1

## 2012-01-13 MED ORDER — HEPARIN (PORCINE) IN NACL 100-0.45 UNIT/ML-% IJ SOLN
900.0000 [IU]/h | INTRAMUSCULAR | Status: DC
  Administered 2012-01-13: 1350 [IU]/h via INTRAVENOUS
  Administered 2012-01-14: 900 [IU]/h via INTRAVENOUS
  Filled 2012-01-13 (×4): qty 250

## 2012-01-13 MED ORDER — LEVOTHYROXINE SODIUM 100 MCG PO TABS
100.0000 ug | ORAL_TABLET | Freq: Every day | ORAL | Status: DC
  Administered 2012-01-14 – 2012-01-17 (×4): 100 ug via ORAL
  Filled 2012-01-13 (×5): qty 1

## 2012-01-13 MED ORDER — METOPROLOL TARTRATE 12.5 MG HALF TABLET
12.5000 mg | ORAL_TABLET | Freq: Two times a day (BID) | ORAL | Status: DC
  Administered 2012-01-13 – 2012-01-14 (×4): 12.5 mg via ORAL
  Filled 2012-01-13 (×7): qty 1

## 2012-01-13 NOTE — H&P (Signed)
History and Physical  Patient ID: Shelby Wong MRN: [id number], SOB: 1950-12-13 61 y.o. Date of Encounter: 01/13/2012, 8:56 AM  Primary Physician: Lorretta Harp, MD, MD Primary Cardiologist: Dr. Excell Seltzer  Chief Complaint: chest pain  HPI: 60 y/o F with hx of hypothyroidism, GERD was recently discharged from the hospital in February following an admission for NSTEMI with peak troponin 2.04 in which cath demonstrated minimal nonobstructive coronary plaque. CT angio was performed demonstrating no evidence of PE, but enlargement of main pulmonary artery raising possibility of PAH. Her LFTs also rose that admission prompting abd [place] which revealed possible hepatic steatosis. It was questioned if this was a picture of myopericarditis without typical EKG changes. She was discharged 12/12/11, and saw Tereso Newcomer in our office 12/24/11 at which time she was doing well. Follow-up labs had indicated normalized LFTs and CRP/ESR were elevated, which was discussed with Dr. Excell Seltzer and felt consistent with myopericarditis. She was placed on NSAIDS but had to come off due to severe reflux treated with carafate and Nexium.  She returns today with complaints of left-sided chest pain that is similar in quality to the CP in February, but is more constant and less severe. It started last night at rest and has been intermittently waxing & waning without provoking factors or any associated symptoms such as SOB, nausea, vomiting, or diaphoresis. She took carafate yesterday evening without any relief. Because of persistence of symptoms, she came to the ER where EKG was normal. GI cocktail gave no relief. She is still able to feel it every so often, and thinks there may be a component of increased pain to palpation. Pain is not worse with inspiration. POC troponins were negative x 2, then 3rd set at 5:12am came back mildly elevated at 0.15.   She also reports a history of intermittent fleeting flutter-like palpitations lasting only  a second. On telemetry she has had occasional PVCs which may be the culprit. Telemetry did reveal a brief 9 beat run of regular atrial tachycardia that appears to break then go into NSR. She denies any sustained palpitations or syncope.     Past Medical History  Diagnosis Date  . Allergic rhinitis   . Depression   . GERD (gastroesophageal reflux disease)   . Hiatal hernia   . Diverticulosis of colon (without mention of hemorrhage)   . Hypothyroidism   . IBS (irritable bowel syndrome)   . Anxiety   . Hemorrhoids   . NSTEMI (non-ST elevated myocardial infarction)     12/2011 with peak troponin 2. Cath - mild nonobstructive plaque. ?Myopericarditis.  . Obesity   . Transaminitis     Abd [place] 12/2011 - hepatic steatosis  . Pulmonary HTN     Enlarged pulmonary artery by CT angio 12/2011 - PAP by echo 12/2011    2D Echo 12/2011 Study Conclusions - Left ventricle: The cavity size was normal. Wall thickness was increased in a pattern of mild LVH. Systolic function was normal. The estimated ejection fraction was in the range of 55% to 60%. Wall motion was normal; there were no regional wall motion abnormalities. - Pulmonary arteries: Systolic pressure was mildly to moderately increased. PA peak pressure: 46mm Hg (S).  Surgical History:  Past Surgical History  Procedure Date  . Cholecystectomy   . Tonsillectomy and adenoidectomy      Home Meds: Medication Sig  Cholecalciferol (VITAMIN D) 400 UNITS capsule Take 400 Units by mouth daily.    esomeprazole (NEXIUM) 40 MG capsule Take 40  mg by mouth daily before breakfast.  FLUoxetine (PROZAC) 10 MG capsule Take 10 mg by mouth daily.  levothyroxine (SYNTHROID, LEVOTHROID) 100 MCG tablet Take 100 mcg by mouth daily.  Loratadine (CLARITIN) 10 MG CAPS Take 10 mg by mouth daily as needed. As needed for sinus  sucralfate (CARAFATE) 1 GM/10ML suspension Take 1 g by mouth 4 (four) times daily.    Allergies: No Known Allergies  History    Social History  . Marital Status: Divorced    Spouse Name: N/A    Number of Children: 1  . Years of Education: N/A   Occupational History  . Direct Sales Rep    Social History Main Topics  . Smoking status: Former Smoker    Quit date: 09/11/2006  . Smokeless tobacco: Never Used  . Alcohol Use: Yes     occassional  . Drug Use: No  . Sexually Active: Not on file   Other Topics Concern  . 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 Arthritis Mother     osteo  . Breast cancer Mother   . Coronary artery disease Mother   . Other Mother     hearing loss  . Diabetes Brother     50 s  . Thyroid disease Mother   . Colon cancer Neg Hx     Review of Systems: General: negative for chills, fever, night sweats or weight changes.  Cardiovascular: negative for edema, orthopnea, palpitations, paroxysmal nocturnal dyspnea, shortness of breath or dyspnea on exertion Dermatological: negative for rash Respiratory: negative for cough or wheezing Urologic: negative for hematuria Abdominal: negative for nausea, vomiting, diarrhea, bright red blood per rectum, melena, or hematemesis Neurologic: negative for visual changes, syncope, or dizziness All other systems reviewed and are otherwise negative except as noted above.  Labs:   Lab Results  Component Value Date   WBC 6.3 12/17/2011   HGB 12.2 01/13/2012   HCT 36.0 01/13/2012   MCV 92.8 12/17/2011   PLT 196 12/17/2011    Lab 01/13/12 0309  NA 142  K 3.2*  CL 105  CO2 --  BUN 9  CREATININE 0.90  CALCIUM --  PROT --  BILITOT --  ALKPHOS --  ALT --  AST --  GLUCOSE 95   POC troponin neg x 2, then 3rd set at 5:12am -> 0.15  Lab Results  Component Value Date   CHOL 147 12/15/2011   HDL 48 12/15/2011    LDLCALC 78 12/15/2011   TRIG 107 12/15/2011   Lab Results  Component Value Date   DDIMER 0.22 12/15/2011    Radiology/Studies:  1. Chest 2 View 01/12/2012  *RADIOLOGY REPORT*  Clinical Data: Left chest pain, history MI, GERD, hiatal hernia  CHEST - 2 VIEW  Comparison: 12/12/2011  Findings: Upper-normal size of cardiac silhouette. Mediastinal contours and pulmonary vascularity normal. Emphysematous changes with parenchymal scarring in mid lungs bilaterally. No acute infiltrate, pleural effusion or pneumothorax. Bones appear demineralized.  IMPRESSION: Emphysematous changes with bilateral parenchymal lung scarring. No acute abnormalities.  Original Report Authenticated By: Lollie Marrow, M.D.    EKG: NSR 82bpm no acute changes  Physical Exam: Blood pressure 138/76, pulse 75, temperature 98.2 F (36.8 C), temperature source Oral, resp. rate  15, SpO2 94.00%. General: Well developed, well nourished, in no acute distress. Head: Normocephalic, atraumatic, sclera non-icteric, no xanthomas, nares are without discharge.  Neck: Negative for carotid bruits. JVD not elevated. Lungs: Clear bilaterally to auscultation without wheezes, rales, or rhonchi. Breathing is unlabored. Heart: RRR with S1 S2. No murmurs, rubs, or gallops appreciated. Abdomen: Soft, non-tender, non-distended with normoactive bowel sounds. No hepatomegaly. No rebound/guarding. No obvious abdominal masses. Msk:  Strength and tone appear normal for age. Extremities: No clubbing or cyanosis. Tr sockline edema.  Distal pedal pulses are 2+ and equal bilaterally. Neuro: Alert and oriented X 3. Moves all extremities spontaneously. Psych:  Responds to questions appropriately with a normal affect.    ASSESSMENT AND PLAN:  1. Chest pain, unclear etiology 2. H/o NSTEMI 12/2011 felt possibly secondary to myopericarditis (mild nonobstructive plaque on cath) 3. Palpitations with occasional PVCs and brief 9-beat atrial tach on telemetry 4. H/o  GERD 5. Hypokalemia  The etiology for her chest pain is unclear at this time but 3rd set of troponin returns mildly elevated. She had an NSTEMI with a troponin of 2 in February 2013 ultimately felt secondary to myopericarditis. Her ESR and CRP were elevated at this time. We will also obtain a 2D echo to assess for any effusions - it is unclear if perhaps she has an autoimmune component to her presentation given her recent diagnosis of pHTN, elevated inflammatory markers, and myopericarditis. We will admit her, cycle cardiac enzymes, repeat inflammatory markers, and place her on aspirin. If her inflammatory markers are still elevated, she may need further workup for autoimmune illness. We will also place her on beta blocker given brief atrial tach and will observe on telemetry. Her potassium will be repleted.  Signed, Ronie Spies PA-C 01/13/2012, 8:56 AM  Attending Note:   The patient was seen and examined.  Agree with assessment and plan as noted above.  Pt has a complex hx.  CP last month, + troponins but negative cath.  Elevated  CRP and elevated ESR. Will repeat  Presents today with recurrent cp.  Tropinin is minimally elevated.    Agree with admit for obs. .  Check enzymes.repeat echo to ensure that she has no pericardial effusion and that EF is normal.  Tele : atrial run - corresponds to some palps .  Will start low dose metoprolol   Vesta Mixer, Montez Hageman., MD, [facility] 01/13/2012, 10:08 AM

## 2012-01-13 NOTE — ED Notes (Signed)
Drink provided for patient.

## 2012-01-13 NOTE — ED Notes (Signed)
MD in the room now to see PT

## 2012-01-13 NOTE — ED Provider Notes (Signed)
History     CSN: [id number]  Arrival date & time 01/12/12  2100   First MD Initiated Contact with Patient 01/13/12 [phone]      Chief Complaint  Patient presents with  . Chest Pain    (Consider location/radiation/quality/duration/timing/severity/associated sxs/prior treatment) HPI This is a 61 year old white female who was seen here a month ago for vague chest discomfort. Her cardiac enzymes were elevated at that time. Cardiac catheterization and echocardiogram were unremarkable. She states the cardiologist told at that time that he was likely due to a viral infection. She was placed on ibuprofen but this is discontinued due to significant exacerbation of her GERD. She is not on any aspirin or other anticoagulation therapy. She is here now with recurrence of the left upper quadrant chest pain, which he describes as burning and grabbing. It is mild to moderate severity. There is no associated diaphoresis or dyspnea. There is mild nausea associated. There is no exacerbating or mitigating factor. She states the discomfort is more constant than previously. On her previous hospitalization the pain waxed and waned.  Past Medical History  Diagnosis Date  . Allergic rhinitis   . Depression   . GERD (gastroesophageal reflux disease)   . Headache   . Hiatal hernia   . Diverticulosis of colon (without mention of hemorrhage)   . Thyroid disease   . IBS (irritable bowel syndrome)   . Anxiety   . Hemorrhoids   . MI (myocardial infarction)   . Obesity     Past Surgical History  Procedure Date  . Cholecystectomy   . Tonsillectomy and adenoidectomy     Family History  Problem Relation Age of Onset  . Arthritis Mother     osteo  . Breast cancer Mother   . Coronary artery disease Mother   . Other Mother     hearing loss  . Diabetes Brother     50 s  . Thyroid disease Mother   . Colon cancer Neg Hx     History  Substance Use Topics  . Smoking status: Former Smoker    Quit date:  09/11/2006  . Smokeless tobacco: Never Used  . Alcohol Use: Yes     occassional    OB History    Grav Para Term Preterm Abortions TAB SAB Ect Mult Living                  Review of Systems  All other systems reviewed and are negative.    Allergies  Review of patient's allergies indicates no known allergies.  Home Medications   Current Outpatient Rx  Name Route Sig Dispense Refill  . VITAMIN D 400 UNITS PO CAPS Oral Take 400 Units by mouth daily.      Marland Kitchen ESOMEPRAZOLE MAGNESIUM 40 MG PO CPDR Oral Take 40 mg by mouth daily before breakfast.    . FLUOXETINE HCL 10 MG PO CAPS Oral Take 10 mg by mouth daily.    Marland Kitchen LEVOTHYROXINE SODIUM 100 MCG PO TABS Oral Take 100 mcg by mouth daily.    Marland Kitchen LORATADINE 10 MG PO CAPS Oral Take 10 mg by mouth daily as needed. As needed for sinus    . SUCRALFATE 1 GM/10ML PO SUSP Oral Take 1 g by mouth 4 (four) times daily.      BP 147/68  Pulse 88  Temp(Src) 98.2 F (36.8 C) (Oral)  Resp 20  SpO2 99%  Physical Exam General: Well-developed, well-nourished female in no acute distress; appearance consistent with  age of record HENT: normocephalic, atraumatic Eyes: pupils equal round and reactive to light; extraocular muscles intact Neck: supple Heart: regular rate and rhythm; no murmurs Lungs: clear to auscultation bilaterally Abdomen: soft; nondistended; nontender; bowel sounds present Extremities: No deformity; full range of motion; pulses normal; no edema Neurologic: Awake, alert and oriented; motor function intact in all extremities and symmetric; no facial droop Skin: Warm and dry Psychiatric: Normal mood and affect    ED Course  Procedures (including critical care time)     MDM  EKG Interpretation:  Date & Time: 01/13/2012 9:11 PM  Rate: 82  Rhythm: normal sinus rhythm  QRS Axis: normal  Intervals: normal  ST/T Wave abnormalities: normal  Conduction Disutrbances:none  Narrative Interpretation:   Old EKG Reviewed:  unchanged  Nursing notes and vitals signs, including pulse oximetry, reviewed.  Summary of this visit's results, reviewed by myself:  Labs:  Results for orders placed during the hospital encounter of 01/12/12  POCT I-STAT TROPONIN I      Component Value Range   Troponin i, poc 0.00  0.00 - 0.08 (ng/mL)   Comment 3           POCT I-STAT, CHEM 8      Component Value Range   Sodium 141  135 - 145 (mEq/L)   Potassium 3.3 (*) 3.5 - 5.1 (mEq/L)   Chloride 105  96 - 112 (mEq/L)   BUN 9  6 - 23 (mg/dL)   Creatinine, Ser 1.61  0.50 - 1.10 (mg/dL)   Glucose, Bld 99  70 - 99 (mg/dL)   Calcium, Ion 0.96  0.45 - 1.32 (mmol/L)   TCO2 26  0 - 100 (mmol/L)   Hemoglobin 12.6  12.0 - 15.0 (g/dL)   HCT 40.9  81.1 - 91.4 (%)  POCT I-STAT, CHEM 8      Component Value Range   Sodium 142  135 - 145 (mEq/L)   Potassium 3.2 (*) 3.5 - 5.1 (mEq/L)   Chloride 105  96 - 112 (mEq/L)   BUN 9  6 - 23 (mg/dL)   Creatinine, Ser 7.82  0.50 - 1.10 (mg/dL)   Glucose, Bld 95  70 - 99 (mg/dL)   Calcium, Ion 9.56  2.13 - 1.32 (mmol/L)   TCO2 27  0 - 100 (mmol/L)   Hemoglobin 12.2  12.0 - 15.0 (g/dL)   HCT 08.6  57.8 - 46.9 (%)  POCT I-STAT TROPONIN I      Component Value Range   Troponin i, poc 0.04  0.00 - 0.08 (ng/mL)   Comment 3           POCT I-STAT TROPONIN I      Component Value Range   Troponin i, poc 0.15 (*) 0.00 - 0.08 (ng/mL)   Comment NOTIFIED PHYSICIAN     Comment 3             Imaging Studies: Dg Chest 2 View  01/12/2012  *RADIOLOGY REPORT*  Clinical Data: Left chest pain, history MI, GERD, hiatal hernia  CHEST - 2 VIEW  Comparison: 12/12/2011  Findings: Upper-normal size of cardiac silhouette. Mediastinal contours and pulmonary vascularity normal. Emphysematous changes with parenchymal scarring in mid lungs bilaterally. No acute infiltrate, pleural effusion or pneumothorax. Bones appear demineralized.  IMPRESSION: Emphysematous changes with bilateral parenchymal lung scarring. No acute  abnormalities.  Original Report Authenticated By: Lollie Marrow, M.D.   5:26 AM Troponins trending upwards. None are as high as they were on previous admission; troponin  was greater than 2 at that time.  5:56 AM Discussed with [place] cardiologist. They will see patient in the ED.              Hanley Seamen, MD 01/13/12 [phone]

## 2012-01-13 NOTE — ED Notes (Signed)
Report received from IMMA, RN 

## 2012-01-13 NOTE — ED Notes (Addendum)
In hosp 1 month ago with CP by EMS, Enzymes was elevated, BP was high, liver elevated and was admitted for cardiac cath was neg. Echo was done and it was neg. Today stated having Chest pain, felt funny,  Pain is burning and grabbing to the left side and is constant. Pt denies any SOB, N/V or weekness.

## 2012-01-13 NOTE — ED Notes (Signed)
Attempt to call report.  rn off floor.  To return call in 5 min

## 2012-01-13 NOTE — ED Notes (Signed)
Pt ambulatory to restroom

## 2012-01-13 NOTE — ED Notes (Signed)
MD in the  room to update pt, waiting for card consult to see pt.

## 2012-01-13 NOTE — ED Notes (Addendum)
Pt resting, VSS.

## 2012-01-13 NOTE — Progress Notes (Signed)
Results of +troponin noted, elevated at 2.01 and LFTs have risen once again. Her clinical picture is not entirely clear. Discussed with Dr. Elease Hashimoto and Dr. Excell Seltzer, will plan for cardiac MRI in morning to assess for ?recurrent myocarditis. Will start on full-dose heparin. 2D echo pending. I spoke with patient regarding results.  Aymar Whitfill PA-C

## 2012-01-13 NOTE — Progress Notes (Signed)
ANTICOAGULATION CONSULT NOTE - Initial Consult  Pharmacy Consult for heparin Indication: chest pain/ACS  No Known Allergies  Patient Measurements: Weight: 228 lb 12.8 oz (103.783 kg) Heparin Dosing Weight: 80.9kg  Vital Signs: Temp: 98.3 F (36.8 C) (03/05 1243) Temp src: Oral (03/05 1243) BP: 149/85 mmHg (03/05 1243) Pulse Rate: 66  (03/05 1243)  Labs:  Basename 01/13/12 1309 01/13/12 1117 01/13/12 0309 01/13/12 0106  HGB 12.3 -- 12.2 --  HCT 37.0 -- 36.0 37.0  PLT 207 -- -- --  APTT -- -- -- --  LABPROT -- -- -- --  INR -- -- -- --  HEPARINUNFRC -- -- -- --  CREATININE 0.72 -- 0.90 0.70  CKTOTAL -- 146 -- --  CKMB -- 16.4* -- --  TROPONINI -- 2.01* -- --   The CrCl is unknown because both a height and weight (above a minimum accepted value) are required for this calculation.  Medical History: Past Medical History  Diagnosis Date  . Allergic rhinitis   . Depression   . GERD (gastroesophageal reflux disease)   . Hiatal hernia   . Diverticulosis of colon (without mention of hemorrhage)   . Hypothyroidism   . IBS (irritable bowel syndrome)   . Anxiety   . Hemorrhoids   . NSTEMI (non-ST elevated myocardial infarction)     12/2011 with peak troponin 2. Cath - mild nonobstructive plaque. ?Myopericarditis.  . Obesity   . Transaminitis     Abd [place] 12/2011 - hepatic steatosis  . Pulmonary HTN     Enlarged pulmonary artery by CT angio 12/2011 - PAP by echo 12/2011    Medications:  Prescriptions prior to admission  Medication Sig Dispense Refill  . Cholecalciferol (VITAMIN D) 400 UNITS capsule Take 400 Units by mouth daily.        Marland Kitchen esomeprazole (NEXIUM) 40 MG capsule Take 40 mg by mouth daily before breakfast.      . FLUoxetine (PROZAC) 10 MG capsule Take 10 mg by mouth daily.      Marland Kitchen levothyroxine (SYNTHROID, LEVOTHROID) 100 MCG tablet Take 100 mcg by mouth daily.      . Loratadine (CLARITIN) 10 MG CAPS Take 10 mg by mouth daily as needed. As needed for sinus       . sucralfate (CARAFATE) 1 GM/10ML suspension Take 1 g by mouth 4 (four) times daily.        Assessment: Shelby Wong with recent negative cardiac cath in February admitted with chest pain to start IV heparin. CBC is WNL. She was on heparin during her last admission where she required a rate of 1350units/hr to achieve therapeutic heparin levels.   Goal of Therapy:  Heparin level 0.3-0.7 units/ml   Plan:  1. Heparin bolus 4000 units IV x 1 2. Heparin gtt 1350units/hr 3. Check a 6 hour heparin level 4. Daily heparin level and CBC  Jameon Deller, Drake Leach 01/13/2012,3:29 PM

## 2012-01-13 NOTE — ED Notes (Signed)
MD at bedside. 

## 2012-01-13 NOTE — ED Notes (Signed)
MD at bedside. Cardiology D. Dunn.

## 2012-01-13 NOTE — ED Notes (Signed)
Pt tolerated meds well.  No allergic reaction noted

## 2012-01-14 ENCOUNTER — Inpatient Hospital Stay (HOSPITAL_COMMUNITY): Payer: BC Managed Care – PPO

## 2012-01-14 DIAGNOSIS — I2510 Atherosclerotic heart disease of native coronary artery without angina pectoris: Secondary | ICD-10-CM

## 2012-01-14 DIAGNOSIS — I214 Non-ST elevation (NSTEMI) myocardial infarction: Secondary | ICD-10-CM

## 2012-01-14 LAB — CBC
HCT: 36 % (ref 36.0–46.0)
Hemoglobin: 12 g/dL (ref 12.0–15.0)
MCH: 30 pg (ref 26.0–34.0)
MCHC: 33.3 g/dL (ref 30.0–36.0)
RDW: 13.1 % (ref 11.5–15.5)

## 2012-01-14 LAB — BASIC METABOLIC PANEL
BUN: 10 mg/dL (ref 6–23)
Chloride: 106 mEq/L (ref 96–112)
Creatinine, Ser: 0.74 mg/dL (ref 0.50–1.10)
GFR calc Af Amer: >90 mL/min (ref >90)
Glucose, Bld: 97 mg/dL (ref 70–99)

## 2012-01-14 MED ORDER — GADOBENATE DIMEGLUMINE 529 MG/ML IV SOLN
28.0000 mL | Freq: Once | INTRAVENOUS | Status: AC
  Administered 2012-01-14: 28 mL via INTRAVENOUS

## 2012-01-14 NOTE — Progress Notes (Signed)
   CARE MANAGEMENT NOTE 01/14/2012  Patient:  Shelby Wong, Shelby Wong   Account Number:  [id number]  Date Initiated:  01/14/2012  Documentation initiated by:  GRAVES-BIGELOW,Louretta Tantillo  Subjective/Objective Assessment:   Pt admitted with cp. Plan for cardiac MRI to assess for ? recurrent myocarditis.     Action/Plan:   Anticipated DC Date:  01/16/2012   Anticipated DC Plan:  HOME/SELF CARE      DC Planning Services  CM consult      Choice offered to / List presented to:             Status of service:  In process, will continue to follow Medicare Important Message given?   (If response is "NO", the following Medicare IM given date fields will be blank) Date Medicare IM given:   Date Additional Medicare IM given:    Discharge Disposition:    Per UR Regulation:    Comments:  01-14-12 [address] Tomi Bamberger, RN,BSN [phone] CM will cotninue to monitor for d/c disposition.

## 2012-01-14 NOTE — Progress Notes (Signed)
ANTICOAGULATION CONSULT NOTE - Follow Up Consult  Pharmacy Consult for heparin Indication: chest pain/ACS  No Known Allergies  Patient Measurements: Height: 5\' 5"  (165.1 cm) Weight: 228 lb 12.8 oz (103.783 kg) IBW/kg (Calculated) : 57  Heparin Dosing Weight: 80.9  Vital Signs: Temp: 97.2 F (36.2 C) (03/06 0513) Temp src: Oral (03/06 0513) BP: 133/81 mmHg (03/06 0513) Pulse Rate: 66  (03/06 0513)  Labs:  Basename 01/14/12 1535 01/14/12 0630 01/13/12 2248 01/13/12 2247 01/13/12 1710 01/13/12 1309 01/13/12 1117 01/13/12 0309  HGB -- 12.0 -- -- -- 12.3 -- --  HCT -- 36.0 -- -- -- 37.0 -- 36.0  PLT -- 185 -- -- -- 207 -- --  APTT -- -- -- -- -- -- -- --  LABPROT -- -- -- -- -- -- -- --  INR -- -- -- -- -- -- -- --  HEPARINUNFRC 0.47 0.81* -- 0.95* -- -- -- --  CREATININE -- 0.74 -- -- -- 0.72 -- 0.90  CKTOTAL -- -- 175 -- 194* -- 146 --  CKMB -- -- 14.9* -- 19.5* -- 16.4* --  TROPONINI -- -- 4.67* -- 3.99* -- 2.01* --   Estimated Creatinine Clearance: 91.6 ml/min (by C-G formula based on Cr of 0.74).   Medications:  Infusions:     . heparin 900 Units/hr (01/14/12 0859)    Assessment: 60 yof on IV heparin for CP. Heparin level now in goal range at 0.47.  Goal of Therapy:  Heparin level 0.3-0.7 units/ml   Plan:  Continue IV heparin at 900 ut/hrs and recheck level in am.  Pasty Spillers 01/14/2012,4:18 PM

## 2012-01-14 NOTE — Progress Notes (Signed)
UR Completed. Simmons, Edwards Mckelvie F [phone]  

## 2012-01-14 NOTE — Progress Notes (Signed)
ANTICOAGULATION CONSULT NOTE - Follow Up Consult  Pharmacy Consult for heparin Indication: chest pain/ACS  No Known Allergies  Patient Measurements: Height: 5\' 5"  (165.1 cm) Weight: 228 lb 12.8 oz (103.783 kg) IBW/kg (Calculated) : 57  Heparin Dosing Weight: 80.9  Vital Signs: Temp: 97.2 F (36.2 C) (03/06 0513) Temp src: Oral (03/06 0513) BP: 133/81 mmHg (03/06 0513) Pulse Rate: 66  (03/06 0513)  Labs:  Basename 01/14/12 0630 01/13/12 2248 01/13/12 2247 01/13/12 1710 01/13/12 1309 01/13/12 1117 01/13/12 0309  HGB 12.0 -- -- -- 12.3 -- --  HCT 36.0 -- -- -- 37.0 -- 36.0  PLT 185 -- -- -- 207 -- --  APTT -- -- -- -- -- -- --  LABPROT -- -- -- -- -- -- --  INR -- -- -- -- -- -- --  HEPARINUNFRC 0.81* -- 0.95* -- -- -- --  CREATININE 0.74 -- -- -- 0.72 -- 0.90  CKTOTAL -- 175 -- 194* -- 146 --  CKMB -- 14.9* -- 19.5* -- 16.4* --  TROPONINI -- 4.67* -- 3.99* -- 2.01* --   Estimated Creatinine Clearance: 91.6 ml/min (by C-G formula based on Cr of 0.74).   Medications:  Infusions:    . heparin 1,100 Units/hr (01/14/12 0025)    Assessment: 60 yof on IV heparin for CP. Heparin level remains elevated. No bleeding or other problems noted. CBC is stable. Required higher dosing on a previous admission but different requirements may be due to different heparin bags being used.   Goal of Therapy:  Heparin level 0.3-0.7 units/ml   Plan:  1. Decrease heparin to 900 units/hr 2. Check a 6 hour heparin level after change has been made 3. F/u plan  Laxmi Choung, Drake Leach 01/14/2012,8:09 AM

## 2012-01-14 NOTE — Progress Notes (Signed)
Patient Name: Shelby Wong Date of Encounter: 01/14/2012  Active Problems:  Chest pain    SUBJECTIVE: Still with minimal chest pain. Feels otherwise well. No dyspnea or other complaints.   OBJECTIVE  Filed Vitals:   01/13/12 1146 01/13/12 1243 01/13/12 1927 01/14/12 0513  BP: 131/66 149/85 115/63 133/81  Pulse: 69 66 70 66  Temp: 97.7 F (36.5 C) 98.3 F (36.8 C) 98.3 F (36.8 C) 97.2 F (36.2 C)  TempSrc: Oral Oral Oral Oral  Resp: 18 18 18 16   Height:  5\' 5"  (1.651 m)    Weight:  228 lb 12.8 oz (103.783 kg)    SpO2: 98% 100% 96% 96%    Intake/Output Summary (Last 24 hours) at 01/14/12 1348 Last data filed at 01/14/12 0845  Gross per 24 hour  Intake      0 ml  Output      0 ml  Net      0 ml   Weight change:  Wt Readings from Last 3 Encounters:  01/13/12 228 lb 12.8 oz (103.783 kg)  12/24/11 230 lb (104.327 kg)  12/23/11 228 lb 3.2 oz (103.511 kg)     PHYSICAL EXAM  General: Well developed, well nourished, in no acute distress. Overweight.  Head: Normocephalic, atraumatic.  Neck: Supple without bruits, JVD. Lungs:  Resp regular and unlabored, CTA. Heart: RRR, S1, S2, no S3, S4, or murmurs. No friction rub. Abdomen: Soft, non-tender, non-distended, BS + x 4.  Extremities: No clubbing, cyanosis, edema.  Neuro: Alert and oriented X 3. Moves all extremities spontaneously. Psych: Normal affect.  LABS:  CBC: Basename 01/14/12 0630 01/13/12 1309  WBC 6.3 5.9  NEUTROABS -- 3.6  HGB 12.0 12.3  HCT 36.0 37.0  MCV 90.0 89.6  PLT 185 207   INR:No results found for this basename: INR in the last 72 hours Basic Metabolic Panel: Basename 01/14/12 0630 01/13/12 1309 01/13/12 0309  NA 140 -- 142  K 3.7 -- 3.2*  CL 106 -- 105  CO2 26 -- --  GLUCOSE 97 -- 95  BUN 10 -- 9  CREATININE 0.74 0.72 --  CALCIUM 9.2 -- --  MG -- -- --  PHOS -- -- --   Liver Function Tests: Basename 01/13/12 1309  AST 254*  ALT 76*  ALKPHOS 111  BILITOT 1.4*  PROT 6.8    ALBUMIN 3.4*   No results found for this basename: LIPASE:2,AMYLASE:2 in the last 72 hours Cardiac Enzymes: Basename 01/13/12 2248 01/13/12 1710 01/13/12 1117  CKTOTAL 175 194* 146  CKMB 14.9* 19.5* 16.4*  CKMBINDEX -- -- --  TROPONINI 4.67* 3.99* 2.01*   Thyroid Function Tests: Basename 01/13/12 1309  TSH 3.205  T4TOTAL --  T3FREE --  THYROIDAB --    TELE:      Sinus rhythm, no arrhythmia  Radiology/Studies: Dg Chest 2 View 01/12/2012  *RADIOLOGY REPORT*  Clinical Data: Left chest pain, history MI, GERD, hiatal hernia  CHEST - 2 VIEW  Comparison: 12/12/2011  Findings: Upper-normal size of cardiac silhouette. Mediastinal contours and pulmonary vascularity normal. Emphysematous changes with parenchymal scarring in mid lungs bilaterally. No acute infiltrate, pleural effusion or pneumothorax. Bones appear demineralized.  IMPRESSION: Emphysematous changes with bilateral parenchymal lung scarring. No acute abnormalities.  Original Report Authenticated By: Lollie Marrow, M.D.   Current Medications:     . aspirin EC  81 mg Oral Daily  . cholecalciferol  400 Units Oral Daily  . FLUoxetine  10 mg Oral Daily  . heparin  4,000 Units Intravenous Once  . levothyroxine  100 mcg Oral QAC breakfast  . metoprolol tartrate  12.5 mg Oral BID  . pantoprazole  40 mg Oral Daily  . sodium chloride  3 mL Intravenous Q12H  . sucralfate  1 g Oral QID  . DISCONTD: heparin  5,000 Units Subcutaneous Q8H    ASSESSMENT AND PLAN: see below.  Signed, Theodore Demark , PA-C 1:48 PM 01/14/2012  Patient seen, examined. Available data reviewed. Agree with findings, assessment, and plan as outlined by Theodore Demark, PA-C. This is a really perplexing situation. Last admission similar with extensive workup negative. This included cath and CT angio to rule out PE. Cardiac MRI is pending. Will order repeat enzymes and sed rate in the morning. Keep on heparin for now.  Tonny Bollman, M.D. 01/14/2012 7:43  PM

## 2012-01-14 NOTE — Progress Notes (Signed)
ANTICOAGULATION CONSULT NOTE - Follow Up Consult  Pharmacy Consult for heparin Indication: chest pain/ACS  Labs:  Basename 01/13/12 2248 01/13/12 2247 01/13/12 1710 01/13/12 1309 01/13/12 1117 01/13/12 0309 01/13/12 0106  HGB -- -- -- 12.3 -- 12.2 --  HCT -- -- -- 37.0 -- 36.0 37.0  PLT -- -- -- 207 -- -- --  APTT -- -- -- -- -- -- --  LABPROT -- -- -- -- -- -- --  INR -- -- -- -- -- -- --  HEPARINUNFRC -- 0.95* -- -- -- -- --  CREATININE -- -- -- 0.72 -- 0.90 0.70  CKTOTAL 175 -- 194* -- 146 -- --  CKMB 14.9* -- 19.5* -- 16.4* -- --  TROPONINI 4.67* -- 3.99* -- 2.01* -- --   Assessment: 60yo female supratherapeutic on heparin with initial dosing for CP, likely due to new pre-made bags of heparin.  Goal of Therapy:  Heparin level 0.3-0.7 units/ml   Plan:  Will decrease heparin gtt to 1100 units/hr and check level in 6hr.  Colleen Can PharmD BCPS 01/14/2012,12:26 AM

## 2012-01-14 NOTE — Progress Notes (Signed)
Patient wanted to let me know about a couple bruises on her right arm AC area. She reports that it happened in the ED when they took her BP. The patient complains of an achy feeling in her right arm where the knot is. Lajuana Matte, RN

## 2012-01-15 LAB — HEPATIC FUNCTION PANEL
ALT: 34 U/L (ref 0–35)
AST: 43 U/L — ABNORMAL HIGH (ref 0–37)
Albumin: 3.4 g/dL — ABNORMAL LOW (ref 3.5–5.2)
Alkaline Phosphatase: 87 U/L (ref 39–117)
Total Bilirubin: 1.1 mg/dL (ref 0.3–1.2)
Total Protein: 6.6 g/dL (ref 6.0–8.3)

## 2012-01-15 LAB — BASIC METABOLIC PANEL
BUN: 11 mg/dL (ref 6–23)
Calcium: 8.7 mg/dL (ref 8.4–10.5)
Creatinine, Ser: 0.78 mg/dL (ref 0.50–1.10)
GFR calc Af Amer: >90 mL/min (ref >90)
GFR calc non Af Amer: >90 mL/min (ref >90)
Glucose, Bld: 92 mg/dL (ref 70–99)
Potassium: 3.5 mEq/L (ref 3.5–5.1)

## 2012-01-15 LAB — CBC
HCT: 36.9 % (ref 36.0–46.0)
MCH: 29.6 pg (ref 26.0–34.0)
MCHC: 32.5 g/dL (ref 30.0–36.0)
MCV: 90.9 fL (ref 78.0–100.0)
Platelets: 196 10*3/uL (ref 150–400)
RDW: 13.1 % (ref 11.5–15.5)
WBC: 7 10*3/uL (ref 4.0–10.5)

## 2012-01-15 LAB — CARDIAC PANEL(CRET KIN+CKTOT+MB+TROPI): CK, MB: 3.4 ng/mL (ref 0.3–4.0)

## 2012-01-15 MED ORDER — DILTIAZEM HCL ER COATED BEADS 120 MG PO CP24
120.0000 mg | ORAL_CAPSULE | Freq: Every day | ORAL | Status: DC
  Administered 2012-01-15 – 2012-01-17 (×3): 120 mg via ORAL
  Filled 2012-01-15 (×3): qty 1

## 2012-01-15 NOTE — Progress Notes (Signed)
    Subjective:  The patient still has mild constant chest discomfort in the left upper chest. She has no dyspnea, palpitations, or other complaints. Overall she feels well. Her chest pain is much improved since admission.  Objective:  Vital Signs in the last 24 hours: Temp:  [97.5 F (36.4 C)-98.4 F (36.9 C)] 97.7 F (36.5 C) (03/07 0900) Pulse Rate:  [67-74] 67  (03/07 0900) Resp:  [16-18] 16  (03/07 0900) BP: (121-142)/(79-101) 121/79 mmHg (03/07 0900) SpO2:  [92 %-94 %] 92 % (03/07 0900)  Intake/Output from previous day:   Physical Exam: Pt is alert and oriented, pleasant overweight woman in NAD HEENT: normal Neck: JVP - normal, carotids 2+= without bruits Lungs: CTA bilaterally CV: RRR without murmur or gallop, no rub Abd: soft, NT, Positive BS, no hepatomegaly Ext: no C/C/E, distal pulses intact and equal Skin: warm/dry no rash  Lab Results:  Basename 01/15/12 0535 01/14/12 0630  WBC 7.0 6.3  HGB 12.0 12.0  PLT 196 185    Basename 01/15/12 0535 01/14/12 0630  NA 139 140  K 3.5 3.7  CL 104 106  CO2 24 26  GLUCOSE 92 97  BUN 11 10  CREATININE 0.78 0.74    Basename 01/15/12 0535 01/13/12 2248  TROPONINI 1.36* 4.67*    Cardiac Studies: Cardiac MRI is pending  Tele: Sinus rhythm without arrhythmia  Assessment/Plan:  Non-ST elevation MI - etiology remains unclear. I am going to stop the patient's IV heparin today. Will consult cardiac rehabilitation and mobilize the patient to see if she has exertional symptoms. I am awaiting her cardiac MRI study result. As documented, her recent evaluation with cardiac catheterization and CT angiogram of the chest was unremarkable. It is possible that the patient has myocarditis but her sedimentation rate is normal and she does not have typical features of this. She also may have coronary vasospasm but there were no EKG changes at the time of her chest pain. I will discontinue her low-dose beta blocker and start her on a  long-acting calcium channel blocker. Further evaluation pending the results of her MRI. I am considering repeat catheterization versus CT coronary angiography to rule out coronary dissection.  Tonny Bollman, M.D. 01/15/2012, 9:31 AM

## 2012-01-15 NOTE — Progress Notes (Signed)
Pt. Ambulated 300 feet on RA. O2 stat range 92-99%. No SOB. Tolerated well. Pt. Back to room with call light within reach. Taressa Rauh, Cheryll Dessert

## 2012-01-16 ENCOUNTER — Encounter (HOSPITAL_COMMUNITY)
Admission: EM | Disposition: A | Discharge status: Home / Self Care | Payer: Self-pay | Source: Ambulatory Visit | Attending: Cardiovascular Disease

## 2012-01-16 DIAGNOSIS — R079 Chest pain, unspecified: Secondary | ICD-10-CM

## 2012-01-16 HISTORY — PX: LEFT AND RIGHT HEART CATHETERIZATION WITH CORONARY ANGIOGRAM: SHX5449

## 2012-01-16 LAB — POCT I-STAT 3, ART BLOOD GAS (G3+)
Acid-Base Excess: 1 mmol/L (ref 0.0–2.0)
Bicarbonate: 25.2 mEq/L — ABNORMAL HIGH (ref 20.0–24.0)
O2 Saturation: 95 %
TCO2: 26 mmol/L (ref 0–100)

## 2012-01-16 LAB — POCT I-STAT 3, VENOUS BLOOD GAS (G3P V)

## 2012-01-16 LAB — PROTIME-INR: INR: 1.16 (ref 0.00–1.49)

## 2012-01-16 LAB — D-DIMER, QUANTITATIVE: D-Dimer, Quant: 0.28 ug/mL-FEU (ref 0.00–0.48)

## 2012-01-16 SURGERY — LEFT AND RIGHT HEART CATHETERIZATION WITH CORONARY ANGIOGRAM
Anesthesia: LOCAL

## 2012-01-16 MED ORDER — ASPIRIN 81 MG PO CHEW
324.0000 mg | CHEWABLE_TABLET | ORAL | Status: DC
  Filled 2012-01-16: qty 1

## 2012-01-16 MED ORDER — SODIUM CHLORIDE 0.9 % IJ SOLN: 3.0000 mL | Freq: Two times a day (BID) | INTRAMUSCULAR | Status: DC

## 2012-01-16 MED ORDER — LIDOCAINE HCL (PF) 1 % IJ SOLN
INTRAMUSCULAR | Status: AC
  Filled 2012-01-16: qty 30

## 2012-01-16 MED ORDER — SODIUM CHLORIDE 0.9 % IV SOLN: 1.0000 mL/kg/h | INTRAVENOUS | Status: DC

## 2012-01-16 MED ORDER — SODIUM CHLORIDE 0.9 % IV SOLN: 250.0000 mL | INTRAVENOUS | Status: DC | PRN

## 2012-01-16 MED ORDER — NITROGLYCERIN 0.2 MG/ML ON CALL CATH LAB
INTRAVENOUS | Status: AC
  Filled 2012-01-16: qty 1

## 2012-01-16 MED ORDER — MIDAZOLAM HCL 2 MG/2ML IJ SOLN
INTRAMUSCULAR | Status: AC
  Filled 2012-01-16: qty 2

## 2012-01-16 MED ORDER — ASPIRIN EC 325 MG PO TBEC
325.0000 mg | DELAYED_RELEASE_TABLET | Freq: Every day | ORAL | Status: DC
  Administered 2012-01-17: 325 mg via ORAL
  Filled 2012-01-16 (×2): qty 1

## 2012-01-16 MED ORDER — OXYCODONE-ACETAMINOPHEN 5-325 MG PO TABS: 1.0000 | ORAL_TABLET | ORAL | Status: DC | PRN

## 2012-01-16 MED ORDER — SODIUM CHLORIDE 0.9 % IJ SOLN: 3.0000 mL | INTRAMUSCULAR | Status: DC | PRN

## 2012-01-16 MED ORDER — DIAZEPAM 2 MG PO TABS: 2.0000 mg | ORAL_TABLET | ORAL | Status: DC | PRN

## 2012-01-16 MED ORDER — HEPARIN (PORCINE) IN NACL 2-0.9 UNIT/ML-% IJ SOLN
INTRAMUSCULAR | Status: AC
  Filled 2012-01-16: qty 2000

## 2012-01-16 MED ORDER — ASPIRIN 81 MG PO CHEW: 324.0000 mg | CHEWABLE_TABLET | ORAL | Status: DC

## 2012-01-16 NOTE — Op Note (Signed)
Catheterization  Indication: Recurrent Chest Pain  Procedure: After informed consent and clinical "time out" the right groin was prepped and draped in a sterile fashion.  A 5Fr sheath was placed in the right femoral artery using seldinger technique and local lidocaine.  Standard JL4, JR4 and angled pigtail catheters were used to engage the coronary arteries.  Coronary arteries were visualized in orthogonal views using caudal and cranial angulation.  RAO ventriculography was done using 24 * cc of contrast.    Medications:   Versed: 2 mg's  Fentanyl: 0 ug's  Coronary Arteries: Right dominant with no anomalies  LM: normal  LAD:  normal    D1: small and normal  D2: large and normal  Circumflex:  Normal   OM1:  One large OM that is normal  RCA:  Dominant and normal   PDA: normal  PLA: normal  Ventriculography: EF: 60 %, no RWMA;s  Hemodynamics: and Right Heart Cath.  Right heat cath done to R/O pulmonary hypertension as source of chest pain   Aortic Pressure: 172/92  mmHg  LV Pressure: 172/10  MmHg  RA: Mean 4 mmHg  RV: 30/1 mmHg  PA: 28/9 mmHg  PCWP:  Mean 5 mmHg  CO:  7.44 L/min  CI: 3.54 L/min/m 2   Impression:  Normal right and left heart cath.  Etiology of patients recurrent chest pain with positive enzymes still not clearly explained.  Tolerated procedure well  Charlton Haws 01/16/2012 2:26 PM

## 2012-01-16 NOTE — Progress Notes (Signed)
    Subjective:  Patient still with some chest burning. She otherwise feels well and denies shortness of breath or palpitations.  Objective:  Vital Signs in the last 24 hours: Temp:  [98 F (36.7 C)-98.4 F (36.9 C)] 98.4 F (36.9 C) (03/08 0412) Pulse Rate:  [62-78] 76  (03/08 0412) Resp:  [20] 20  (03/08 0412) BP: (126-138)/(75-88) 131/84 mmHg (03/08 0412) SpO2:  [95 %-97 %] 95 % (03/08 0412)  Intake/Output from previous day: 03/07 0701 - 03/08 0700 In: 840 [P.O.:840] Out: -   Physical Exam: Pt is alert and oriented, pleasant overweight woman in NAD HEENT: normal Neck: JVP - normal, carotids 2+= without bruits Lungs: CTA bilaterally CV: RRR without murmur or gallop Abd: soft, NT, Positive BS, no hepatomegaly Ext: no C/C/E, distal pulses intact and equal Skin: warm/dry no rash  Lab Results:  Basename 01/15/12 0535 01/14/12 0630  WBC 7.0 6.3  HGB 12.0 12.0  PLT 196 185    Basename 01/15/12 0535 01/14/12 0630  NA 139 140  K 3.5 3.7  CL 104 106  CO2 24 26  GLUCOSE 92 97  BUN 11 10  CREATININE 0.78 0.74    Basename 01/15/12 0535 01/13/12 2248  TROPONINI 1.36* 4.67*    Cardiac Studies: Cardiac MR: Impression:  1) Normal EF with no distinct RWMA;s EF 57%  2) Two small areas of mid epicardial gadolinium uptake, one in the  mid septum and one in the mid anterolateral wall.  3) No pericardial effusion  4) Normal RV/RA/LA  5) Normal ascending aortic root 32mm  6) Dilatation of the RPA measuring 28.6 mm     Tele: sinus rhythm  Assessment/Plan:  Non-ST elevation MI. The patient's cardiac MRI was reviewed and shows 2 punctate areas of gadolinium uptake. These findings are not consistent with ischemic heart disease. However, the patient had clear elevation of her cardiac enzymes and a presentation consistent with acute coronary syndrome. Her catheterization last month showed no significant coronary artery disease but there was some minor plaque noted. I feel she  should undergo repeat cardiac cath to rule out a dynamic coronary process such as coronary dissection or intramural hematoma. Will ask for a right and left heart catheterization said she had pulmonary hypertension noted on her 2-D echocardiogram. I reviewed the risks, indication, and alternatives with the patient and she understands and agrees to proceed.   Tonny Bollman, M.D. 01/16/2012, 9:36 AM

## 2012-01-16 NOTE — Interval H&P Note (Signed)
History and Physical Interval Note:  01/16/2012 2:00 PM  Shelby Wong  has presented today for surgery, with the diagnosis of chest pain  The various methods of treatment have been discussed with the patient and family. After consideration of risks, benefits and other options for treatment, the patient has consented to  Procedure(s) (LRB): LEFT AND RIGHT HEART CATHETERIZATION WITH CORONARY ANGIOGRAM (N/A) as a surgical intervention .  The patients' history has been reviewed, patient examined, no change in status, stable for surgery.  I have reviewed the patients' chart and labs.  Questions were answered to the patient's satisfaction.     Azavion Bouillon  2:00 PM 01/16/2012

## 2012-01-16 NOTE — Brief Op Note (Signed)
See operative note  Shelby Wong  

## 2012-01-17 ENCOUNTER — Encounter (HOSPITAL_COMMUNITY): Payer: Self-pay | Admitting: Nurse Practitioner

## 2012-01-17 DIAGNOSIS — I514 Myocarditis, unspecified: Principal | ICD-10-CM

## 2012-01-17 LAB — BASIC METABOLIC PANEL
CO2: 25 mEq/L (ref 19–32)
GFR calc non Af Amer: 77 mL/min — ABNORMAL LOW (ref >90)
Glucose, Bld: 92 mg/dL (ref 70–99)
Potassium: 3.7 mEq/L (ref 3.5–5.1)
Sodium: 141 mEq/L (ref 135–145)

## 2012-01-17 LAB — CBC
Hemoglobin: 12.2 g/dL (ref 12.0–15.0)
RBC: 4.04 MIL/uL (ref 3.87–5.11)

## 2012-01-17 MED ORDER — DILTIAZEM HCL ER COATED BEADS 120 MG PO CP24: 120.0000 mg | ORAL_CAPSULE | Freq: Every day | ORAL | Status: DC

## 2012-01-17 MED ORDER — COLCHICINE 0.6 MG PO TABS: 0.6000 mg | ORAL_TABLET | Freq: Two times a day (BID) | ORAL | Status: DC

## 2012-01-17 MED ORDER — ASPIRIN EC 81 MG PO TBEC: 81.0000 mg | DELAYED_RELEASE_TABLET | Freq: Every day | ORAL | Status: AC

## 2012-01-17 MED ORDER — NITROGLYCERIN 0.4 MG SL SUBL: 0.4000 mg | SUBLINGUAL_TABLET | SUBLINGUAL | Status: DC | PRN

## 2012-01-17 NOTE — Progress Notes (Signed)
Pt. Discharged 01/17/2012  4:22 PM Discharge instructions reviewed with patient/family. Patient/family verbalized understanding. All Rx's given. Questions answered as needed. Pt. Discharged to home with family/self.  Noah Charon

## 2012-01-17 NOTE — Discharge Summary (Signed)
Patient ID: Shelby Wong,  MRN: [id number], DOB/AGE: June 23, 1951 61 y.o.  Admit date: 01/12/2012 Discharge date: 01/17/2012  Primary Care Provider: Lorretta Harp, MD Primary Cardiologist: Judie Petit. Excell Seltzer, MD  Discharge Diagnoses Principal Problem:  *Myocarditis Active Problems:  HYPOTHYROIDISM  HYPERLIPIDEMIA  DEPRESSION  ALLERGIC RHINITIS  GERD  HIATAL HERNIA  Chest pain   Allergies No Known Allergies  Procedures  01/14/2012 Cardiac MRI  LV size was normal.  There are no discrete RWMA;s.  The quantitative EF was normal at 57%.  ( EDV 124, ESV 54cc, SV 71cc) Hyperenhancement images showed two small areas of mid epicardial uptake in the mid septum and anterolateral walls.  Given the punctate uptake and mid epicardial location this is more consistant with myocarditis and not CAD. ____________________________________  Cardiac Catheterization 01/16/2012  Coronary Arteries: Right dominant with no anomalies LM: normal LAD:  normal             D1: small and normal             D2: large and normal Circumflex:  Normal             OM1:  One large OM that is normal RCA:  Dominant and normal             PDA: normal             PLA: normal  Ventriculography: EF: 60 %, no RWMA;s ____________________________________  History of Present Illness  A 61 year old female with the above complex Promus. Patient was admitted to [facility] in February of this year with elevated cardiac markers. Diagnostic catheterization showed nonobstructive CAD well CT angiography of the chest showed no evidence of pulmonary embolus. Result presentation may have represented myopericarditis and the patient was medically managed.  Following discharge, patient did well however on the evening prior to admission, she developed recurrent chest discomfort persisting into the morning of March 5. She presented to the [facility] ED where ECG was nonacute and her first 2 sets of troponins were normal while the third set returned  elevated at 0.15. She was admitted for further evaluation.  Hospital Course  Following admission, patient peaked her CK at 194, MB at 19.5, and troponin I of 4.67. She continued to have intermittent chest discomfort and date cardiac MRI was undertaken on March 6 to evaluate for possible myocarditis. While awaiting results of MRI, decision was made to pursue diagnostic catheterization given ongoing symptoms and elevated enzymes. Repeat catheterization was performed on March 8, again showing normal coronary arteries normal LV function. Right heart pressures were evaluated and also normal. MRI results became available on March 8 showing 2 small areas of gadolinium uptake suggestive of myocarditis. Patient has been placed on colchicine therapy and we plan to discharge her home today. Of note, in light of normal coronary arteries and an LDL of 76 evaluated in February of this year, patient is not on statin therapy.  As there is concern for possible coronary vasospasm, pt is on CCB therapy, instead of a bb.  Discharge Vitals Blood pressure 140/75, pulse 71, temperature 97.8 F (36.6 C), temperature source Oral, resp. rate 19, height 5\' 5"  (1.651 m), weight 228 lb 12.8 oz (103.783 kg), SpO2 96.00%.  Filed Weights   01/13/12 1243  Weight: 228 lb 12.8 oz (103.783 kg)   Labs  CBC  Basename 01/17/12 0600 01/15/12 0535  WBC 6.0 7.0  NEUTROABS -- --  HGB 12.2 12.0  HCT 36.6 36.9  MCV 90.6 90.9  PLT 181  196   Basic Metabolic Panel  Basename 01/17/12 0600 01/15/12 0535  NA 141 139  K 3.7 3.5  CL 105 104  CO2 25 24  GLUCOSE 92 92  BUN 10 11  CREATININE 0.82 0.78  CALCIUM 8.9 8.7  MG -- --  PHOS -- --   Liver Function Tests  [facility] 01/15/12 0535  AST 43*  ALT 34  ALKPHOS 87  BILITOT 1.1  PROT 6.6  ALBUMIN 3.4*   Cardiac Enzymes  Basename 01/15/12 0535  CKTOTAL 71  CKMB 3.4  CKMBINDEX --  TROPONINI 1.36*   D-Dimer  Basename 01/16/12 0555  DDIMER 0.28   Disposition  Pt  is being discharged home today in good condition.  Follow-up Plans & Appointments  Follow-up Information    Follow up with Lorretta Harp, MD. (As needed)       Follow up with Tonny Bollman, MD in 2 weeks. (we will arrange)    Contact information:   [address], [address] [place] [address] [phone]         Discharge Medications  Medication List  As of 01/17/2012  3:44 PM   TAKE these medications         aspirin EC 81 MG tablet   Take 1 tablet (81 mg total) by mouth daily.      CLARITIN 10 MG Caps   Generic drug: Loratadine   Take 10 mg by mouth daily as needed. As needed for sinus      colchicine 0.6 MG tablet   Take 1 tablet (0.6 mg total) by mouth 2 (two) times daily.      diltiazem 120 MG 24 hr capsule   Commonly known as: CARDIZEM CD   Take 1 capsule (120 mg total) by mouth daily.      esomeprazole 40 MG capsule   Commonly known as: NEXIUM   Take 40 mg by mouth daily before breakfast.      FLUoxetine 10 MG capsule   Commonly known as: PROZAC   Take 10 mg by mouth daily.      levothyroxine 100 MCG tablet   Commonly known as: SYNTHROID, LEVOTHROID   Take 100 mcg by mouth daily.      nitroGLYCERIN 0.4 MG SL tablet   Commonly known as: NITROSTAT   Place 1 tablet (0.4 mg total) under the tongue every 5 (five) minutes x 3 doses as needed for chest pain.      sucralfate 1 GM/10ML suspension   Commonly known as: CARAFATE   Take 1 g by mouth 4 (four) times daily.      Vitamin D 400 UNITS capsule   Take 400 Units by mouth daily.            Outstanding Labs/Studies  None  Duration of Discharge Encounter   Greater than 30 minutes including physician time.  Signed, Nicolasa Ducking NP 01/17/2012, 3:44 PM   Patient seen and examined independently. Gilford Raid, NP note reviewed carefully - agree with his assessment and plan. I have edited the note based on my findings. Please see my rounding note  from today for full details.  Reagen Goates,MD 5:42 PM

## 2012-01-17 NOTE — H&P (Signed)
Subjective:   61 y/o F with hx of hypothyroidism, GERD was recently discharged from the hospital in February following an admission for NSTEMI with peak troponin 2.04 in which cath demonstrated minimal nonobstructive coronary plaque. CT angio was performed demonstrating no evidence of PE, but enlargement of main pulmonary artery raising possibility of PAH. Her LFTs also rose that admission prompting abd [place] which revealed possible hepatic steatosis. It was questioned if this was a picture of myopericarditis without typical EKG changes. She was discharged 12/12/11, and saw Tereso Newcomer in our office 12/24/11 at which time she was doing well. Follow-up labs had indicated normalized LFTs and CRP/ESR were elevated, which was discussed with Dr. Excell Seltzer and felt consistent with myopericarditis. She was placed on NSAIDS but had to come off due to severe reflux treated with carafate and Nexium.   Readmitted with recurrent CP. Trop 4.57 Underwent repeat cath yesterday with normal cors. PA pressures normal. ESR 8. ANA negative last month. ECG normal.   Cardiac MR, echo and cath reviewed.   MRI with two focal areas of uptake suggestive of myocarditis.  EF normal. No effuision  Feels better no CP or dyspnea. Ambulating.     Intake/Output Summary (Last 24 hours) at 01/17/12 1152 Last data filed at 01/16/12 1900  Gross per 24 hour  Intake    960 ml  Output      0 ml  Net    960 ml    Current meds:    . aspirin EC  325 mg Oral Daily  . cholecalciferol  400 Units Oral Daily  . diltiazem  120 mg Oral Daily  . FLUoxetine  10 mg Oral Daily  . heparin      . levothyroxine  100 mcg Oral QAC breakfast  . lidocaine      . midazolam      . nitroGLYCERIN      . pantoprazole  40 mg Oral Daily  . sodium chloride  3 mL Intravenous Q12H  . sucralfate  1 g Oral QID  . DISCONTD: aspirin  324 mg Oral Pre-Cath  . DISCONTD: aspirin  324 mg Oral NOW  . DISCONTD: aspirin EC  81 mg Oral Daily  . DISCONTD: sodium  chloride  3 mL Intravenous Q12H   Infusions:    . sodium chloride Stopped (01/16/12 1530)  . DISCONTD: sodium chloride       Objective:  Blood pressure 140/75, pulse 71, temperature 97.8 F (36.6 C), temperature source Oral, resp. rate 19, height 5\' 5"  (1.651 m), weight 103.783 kg (228 lb 12.8 oz), SpO2 96.00%. Weight change:   Physical Exam: General:  Well appearing. No resp difficulty HEENT: normal Neck: supple. JVP . Carotids 2+ bilat; no bruits. No lymphadenopathy or thryomegaly appreciated. Cor: PMI nondisplaced. Regular rate & rhythm. No rubs, gallops or murmurs. Lungs: clear Abdomen: soft, nontender, nondistended. No hepatosplenomegaly. No bruits or masses. Good bowel sounds. Extremities: no cyanosis, clubbing, rash, edema Neuro: alert & orientedx3, cranial nerves grossly intact. moves all 4 extremities w/o difficulty. Affect pleasant  Telemetry: SR  Lab Results: Basic Metabolic Panel:  Lab 01/17/12 4098 01/15/12 0535 01/14/12 0630 01/13/12 1309 01/13/12 0309 01/13/12 0106  NA 141 139 140 -- 142 141  K 3.7 3.5 -- -- -- --  CL 105 104 106 -- 105 105  CO2 25 24 26  -- -- --  GLUCOSE 92 92 97 -- 95 99  BUN 10 11 10  -- 9 9  CREATININE 0.82 0.78 0.74 0.72 0.90 --  CALCIUM 8.9 8.7 9.2 -- -- --  MG -- -- -- -- -- --  PHOS -- -- -- -- -- --   Liver Function Tests:  Lab 01/15/12 0535 01/13/12 1309  AST 43* 254*  ALT 34 76*  ALKPHOS 87 111  BILITOT 1.1 1.4*  PROT 6.6 6.8  ALBUMIN 3.4* 3.4*   No results found for this basename: LIPASE:5,AMYLASE:5 in the last 168 hours No results found for this basename: AMMONIA:5 in the last 168 hours CBC:  Lab 01/17/12 0600 01/15/12 0535 01/14/12 0630 01/13/12 1309 01/13/12 0309  WBC 6.0 7.0 6.3 5.9 --  NEUTROABS -- -- -- 3.6 --  HGB 12.2 12.0 12.0 12.3 12.2  HCT 36.6 36.9 36.0 37.0 36.0  MCV 90.6 90.9 90.0 89.6 --  PLT 181 196 185 207 --   Cardiac Enzymes:  Lab 01/15/12 0535 01/13/12 2248 01/13/12 1710 01/13/12 1117    CKTOTAL 71 175 194* 146  CKMB 3.4 14.9* 19.5* 16.4*  CKMBINDEX -- -- -- --  TROPONINI 1.36* 4.67* 3.99* 2.01*   BNP: No components found with this basename: POCBNP:5 CBG: No results found for this basename: GLUCAP:5 in the last 168 hours Microbiology: No results found for this basename: cult   No results found for this basename: CULT:2,SDES:2 in the last 168 hours  Imaging: No results found.   ASSESSMENT:  1. Recurrent CP due to probably myocarditis. EF 57% by MRI.    PLAN/DISCUSSION:  Discussed results of testing at length. Will d/c home today with colchicine 0.6 bid. F/u Dr. Excell Seltzer or Tereso Newcomer in 2 weeks.    LOS: 5 days    Arvilla Meres, MD 01/17/2012, 11:52 AM

## 2012-01-17 NOTE — Discharge Instructions (Signed)
***  PLEASE REMEMBER TO BRING ALL OF YOUR MEDICATIONS TO EACH OF YOUR FOLLOW-UP OFFICE VISITS.  NO HEAVY LIFTING OR SEXUAL ACTIVITY X 7 DAYS. NO DRIVING X 2-3 DAYS. NO SOAKING BATHS, HOT TUBS, POOLS, ETC., X 7 DAYS.  

## 2012-01-18 ENCOUNTER — Other Ambulatory Visit: Payer: Self-pay | Admitting: Internal Medicine

## 2012-02-01 ENCOUNTER — Other Ambulatory Visit: Payer: Self-pay | Admitting: Internal Medicine

## 2012-02-02 ENCOUNTER — Telehealth: Payer: Self-pay | Admitting: Cardiovascular Disease

## 2012-02-02 NOTE — Telephone Encounter (Signed)
Pt states she is feeling tired and having leg cramps.  Advised pt to keep appt with Tereso Newcomer on Wednesday.  Pt agrees.

## 2012-02-02 NOTE — Telephone Encounter (Signed)
New Msg: Pt calling wanting to speak with nurse/MD. Please return pt call to discuss further.  

## 2012-02-03 ENCOUNTER — Encounter: Payer: Self-pay | Admitting: *Deleted

## 2012-02-04 ENCOUNTER — Encounter: Payer: Self-pay | Admitting: Physician Assistant

## 2012-02-04 ENCOUNTER — Ambulatory Visit (INDEPENDENT_AMBULATORY_CARE_PROVIDER_SITE_OTHER): Payer: BC Managed Care – PPO | Admitting: Physician Assistant

## 2012-02-04 ENCOUNTER — Telehealth: Payer: Self-pay | Admitting: *Deleted

## 2012-02-04 VITALS — BP 128/80 | HR 70 | Ht 65.0 in | Wt 231.0 lb

## 2012-02-04 DIAGNOSIS — I514 Myocarditis, unspecified: Secondary | ICD-10-CM

## 2012-02-04 DIAGNOSIS — I214 Non-ST elevation (NSTEMI) myocardial infarction: Secondary | ICD-10-CM

## 2012-02-04 DIAGNOSIS — R079 Chest pain, unspecified: Secondary | ICD-10-CM

## 2012-02-04 DIAGNOSIS — E876 Hypokalemia: Secondary | ICD-10-CM

## 2012-02-04 LAB — BASIC METABOLIC PANEL
BUN: 17 mg/dL (ref 6–23)
Chloride: 103 mEq/L (ref 96–112)
Glucose, Bld: 91 mg/dL (ref 70–99)
Potassium: 3.7 mEq/L (ref 3.5–5.1)
Sodium: 141 mEq/L (ref 135–145)

## 2012-02-04 LAB — MAGNESIUM: Magnesium: 2.1 mg/dL (ref 1.5–2.5)

## 2012-02-04 NOTE — Patient Instructions (Addendum)
Your physician recommends that you schedule a follow-up appointment in: 3-4 WEEKS WITH DR. Excell Seltzer  Your physician recommends that you return for lab work in: TODAY BMET, MAGNESIUM LEVEL DX HYPOKALEMIA   NO MEDICATION CHANGES AT THIS TIME

## 2012-02-04 NOTE — Progress Notes (Signed)
[address] [place], [place]  [address] Phone: [phone] Fax:  [phone]  Date:  02/04/2012   Name:  Shelby Wong       DOB:  26-Aug-1951 MRN:  [id number]  PCP:  Dr. Fabian Sharp Primary Cardiologist:  Dr. Tonny Bollman  Primary Electrophysiologist:  None    History of Present Illness: Shelby Wong is a 61 y.o. female who presents for post hospital follow up.    She was admitted 12/2011 with a NSTEMI.  LFTs were also elevated and statin therapy was stopped.  LHC 12/15/11: mild LAD plaque, EF 55%.  Chest CT 12/16/11: no pulmo embolus, mild CM, enlarged main pulmonary artery concerning for pulmo arterial HTN.  Echo 12/16/11: mild LVH, EF 55-60%, PASP 46.  Abd u/s: fatty liver, s/p cholecystectomy, no focal lesions.  Etiology for presenting symptoms were not clear.  It was questioned if this was a picture of myopericarditis without typical EKG changes.  A short course of NSAIDs was recommended.  After d/c she followed up with GI and LFTs were normalized and CRP and ESR were elevated.    She was admitted again 3/4-3/9 with a NSTEMI.  She presented with recurrent chest discomfort.  Troponin peaked at 4.67.  LHC 01/16/12 demonstrated normal coronary arteries, EF 60%, normal right heart pressures.  Cardiac MRI 01/14/12: No discrete regional wall motion abnormalities, EF 57%, hyperenhancement images showed 2 small areas of mid epicardial uptake in the mid septum and anterolateral walls-given punctate uptake and mid epicardial location, this was felt to be more consistent with myocarditis and not CAD.  The patient was placed on colchicine.  She was kept on calcium channel blocker instead of beta blocker due to concerns of possible coronary vasospasm.  She is doing ok.  She still has some discomfort on the left side of her chest.  Overall, she is improved though.  Still feels tired.  No syncope.  No orthopnea, PND.  No edema.  No DOE.  She is concerned that her K+ was low in the hospital.  K+  upon presentation was 3.2.  She does not take any diuretics.    Past Medical History  Diagnosis Date  . Allergic rhinitis   . Depression   . GERD (gastroesophageal reflux disease)   . Hiatal hernia   . Diverticulosis of colon (without mention of hemorrhage)   . Hypothyroidism   . IBS (irritable bowel syndrome)   . Anxiety   . Hemorrhoids   . NSTEMI (non-ST elevated myocardial infarction)     a. 12/2011 with peak troponin 2. Cath - mild nonobstructive plaque.;  b. nstemi 01/2012, relook cath - nonobs - MRI sugg of myocarditis  . Obesity   . Transaminitis     Abd [place] 12/2011 - hepatic steatosis  . Pulmonary HTN     Enlarged pulmonary artery by CT angio 12/2011 - PAP by echo 12/2011  . Myocarditis     a. 2 sm areas of gad uptake sugg of myocarditis on MRI 01/14/12    Current Outpatient Prescriptions  Medication Sig Dispense Refill  . aspirin 81 MG tablet Take 81 mg by mouth daily.      . Cholecalciferol (VITAMIN D) 400 UNITS capsule Take 400 Units by mouth daily.        . colchicine 0.6 MG tablet Take 1 tablet (0.6 mg total) by mouth 2 (two) times daily.  60 tablet  2  . diltiazem (CARDIZEM CD) 120 MG 24 hr capsule Take 1  capsule (120 mg total) by mouth daily.  30 capsule  6  . esomeprazole (NEXIUM) 40 MG capsule Take 40 mg by mouth daily before breakfast.      . FLUoxetine (PROZAC) 10 MG capsule TAKE ONE CAPSULE BY MOUTH EVERY DAY  30 capsule  3  . levothyroxine (SYNTHROID, LEVOTHROID) 100 MCG tablet TAKE 1 TABLET BY MOUTH EVERY DAY  30 tablet  3  . Loratadine (CLARITIN) 10 MG CAPS Take 10 mg by mouth daily as needed. As needed for sinus      . nitroGLYCERIN (NITROSTAT) 0.4 MG SL tablet Place 1 tablet (0.4 mg total) under the tongue every 5 (five) minutes x 3 doses as needed for chest pain.  25 tablet  3  . sucralfate (CARAFATE) 1 GM/10ML suspension Take 1 g by mouth 4 (four) times daily.        Allergies: No Known Allergies  History  Substance Use Topics  . Smoking status:  Former Smoker    Quit date: 09/11/2006  . Smokeless tobacco: Never Used  . Alcohol Use: Yes     occassional     PHYSICAL EXAM: VS:  BP 128/80  Pulse 70  Ht 5\' 5"  (1.651 m)  Wt 231 lb (104.781 kg)  BMI 38.44 kg/m2 Well nourished, well developed, in no acute distress HEENT: normal Neck: no JVD Cardiac:  normal S1, S2; RRR; no murmur Lungs:  clear to auscultation bilaterally, no wheezing, rhonchi or rales Abd: soft, nontender, no hepatomegaly Ext: no edema; right groin without hematoma or bruit  Skin: warm and dry Neuro:  CNs 2-12 intact, no focal abnormalities noted  EKG:  Sinus rhythm, heart rate 70, normal axis, no acute changes   ASSESSMENT AND PLAN:   1. Myocarditis  She is slowly recovering.  Stay on colchicine and diltiazem for now.  Increase activity slowly.  She is not interested in formal cardiac rehab.  I will let her go back to work 1/2 days on 02/16/12 for one week then full days thereafter.  Follow up with Dr. Tonny Bollman in 3-4 weeks or sooner prn.   2. Hypokalemia  Check BMET and Magnesium.   3. NSTEMI (non-ST elevated myocardial infarction)  No CAD on cath.  Continue ASA.  Follow up as noted.       Luna Glasgow, PA-C  10:42 AM 02/04/2012

## 2012-02-04 NOTE — Telephone Encounter (Signed)
pt notified of lab results today. Shelby Wong  

## 2012-02-04 NOTE — Telephone Encounter (Signed)
Message copied by Tarri Fuller on Wed Feb 04, 2012  4:02 PM ------      Message from: [place], [place] T      Created: Wed Feb 04, 2012  1:28 PM       K+ and Mg 2+ normal      Tereso Newcomer, [place]  1:28 PM 02/04/2012

## 2012-02-12 ENCOUNTER — Telehealth: Payer: Self-pay | Admitting: Cardiovascular Disease

## 2012-02-12 NOTE — Telephone Encounter (Signed)
I spoke with the pt and she is scheduled to go back to work on 02/16/12.  The pt said she over did it on Sunday and did not feel well on Monday.  The pt feels like the colchicine is not helping. The pt said she feels like she is not making any progress and is afraid of going back to work and over doing it and getting readmitted. The pt also did a lot of walking on Sunday and developed cramping in her thigh below the cath site.  The pt feels like this has improved over the week but it does effect her walking at times. The pt would like to know what she should do about returning to work.  I will forward this note to Tereso Newcomer PA-C to review and make further recommendations.

## 2012-02-12 NOTE — Telephone Encounter (Signed)
She can definitely stay out of work longer.  She can go back 4/15 at 1/2 days.  Groin site is probably ok.  If increased swelling or pain at site, I can see her.  Otherwise, scale back on activity and slowly increase.  She should reconsider cardiac rehab.  I think it would help her. Tereso Newcomer, PA-C  1:34 PM 02/12/2012

## 2012-02-12 NOTE — Telephone Encounter (Signed)
I spoke with the pt and made her aware of change in RTW date. The pt said she does not need a note at this time.  I made the pt aware that she should take it easy and slowly increase her activity. The pt also wanted to let me know that she had checked her BP today and it was 140/94 and 161/97.  The pt is very anxious and stressed. I instructed the pt to continue to monitor her BP and if it remains elevated after she has calmed down then she should call the office. Pt agreed with plan.

## 2012-02-12 NOTE — Telephone Encounter (Signed)
New Msg: Pt calling wanting to speak with nurse/MD regarding pt returning to work. Pt is questioning if medication is working for her and pt also wanted to discuss a new "thing" that pt has going on.  Please return pt call to discuss further.

## 2012-02-18 ENCOUNTER — Telehealth: Payer: Self-pay | Admitting: Cardiovascular Disease

## 2012-02-18 NOTE — Telephone Encounter (Signed)
I spoke with the Shelby Wong and she is concerned about her BP.  The Shelby Wong does state that she still "feels funny" and can still "feel the inflammation in her chest". The Shelby Wong denies chest pain.   4/10 141/92, 90 PM 4/10 148/95, 73 AM 4/9   139/91, 86 4/8   153/98, 89 4/7   158/98, 77 4/6   155/86, 72  I will forward this information to Dr Excell Seltzer to review.

## 2012-02-18 NOTE — Telephone Encounter (Signed)
Patient request return call from nurse LB concerning elevated Blood Pressure & pulse.  Patient can be reached at [phone].

## 2012-02-20 MED ORDER — LISINOPRIL 10 MG PO TABS: 10.0000 mg | ORAL_TABLET | Freq: Every day | ORAL | Status: DC

## 2012-02-20 NOTE — Telephone Encounter (Signed)
I spoke with the pt and made her aware of Dr Earmon Phoenix recommendation.  Rx sent to pharmacy.  The pt has a upcoming appointment on 03/09/12 with Dr Excell Seltzer and we will check BMP at that time. Pt agreed with plan.

## 2012-02-20 NOTE — Telephone Encounter (Signed)
Pt calling back re discussion pt had with lauren and was to get a call from her yesterday and she didn't, following up on it, pls call

## 2012-02-20 NOTE — Telephone Encounter (Signed)
Fu call °Pt calling back again °

## 2012-02-20 NOTE — Telephone Encounter (Signed)
Recommend start lisinopril 10 mg daily. Followup metabolic panel in one to 2 weeks.

## 2012-02-23 ENCOUNTER — Telehealth: Payer: Self-pay | Admitting: Cardiovascular Disease

## 2012-02-23 NOTE — Telephone Encounter (Signed)
Spoke with pt, she states she cont to feel bad. She reports she can still feel the inflammation in her chest. She states she had a really bad weekend and was ready to go back to work today but cont to feel bad. Her bp is much better on the lisinopril, 124/80. 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 She is afraid she will have to return to the hosp if she returns to work too soon. Will make dr cooper aware.

## 2012-02-23 NOTE — Telephone Encounter (Signed)
Pt was seen by Lorin Picket on 3/27 and she is still not feeling well enough to return to work and she wants to let someone know

## 2012-02-24 ENCOUNTER — Other Ambulatory Visit: Payer: Self-pay | Admitting: Gastroenterology

## 2012-02-24 MED ORDER — PANTOPRAZOLE SODIUM 40 MG PO TBEC: 40.0000 mg | DELAYED_RELEASE_TABLET | Freq: Every day | ORAL | Status: DC

## 2012-02-24 NOTE — Telephone Encounter (Signed)
rx sent

## 2012-02-24 NOTE — Telephone Encounter (Signed)
I spoke with the patient. She continues to have constant chest discomfort which has been present now for some time. She's been hospitalized twice and undergone numerous studies including 2 cardiac catheterizations. I talked to her about consideration of a nonsteroidal anti-inflammatory drug she tried this after her first hospitalization and had major problems with GI intolerance. She also had persistent symptoms. She has been compliant with her medicines and continues to take colchicine. Her blood pressure is much better controlled since starting lisinopril. I think she should remain out of work until she sees me back in followup on April 30. I asked her to call me back if her symptoms worsen and I would be happy to see her sooner in followup.

## 2012-03-09 ENCOUNTER — Encounter: Payer: Self-pay | Admitting: *Deleted

## 2012-03-09 ENCOUNTER — Encounter: Payer: Self-pay | Admitting: Cardiovascular Disease

## 2012-03-09 ENCOUNTER — Ambulatory Visit (INDEPENDENT_AMBULATORY_CARE_PROVIDER_SITE_OTHER): Payer: BC Managed Care – PPO | Admitting: Cardiovascular Disease

## 2012-03-09 VITALS — BP 119/78 | HR 86 | Ht 65.0 in | Wt 232.0 lb

## 2012-03-09 DIAGNOSIS — R079 Chest pain, unspecified: Secondary | ICD-10-CM

## 2012-03-09 DIAGNOSIS — I10 Essential (primary) hypertension: Secondary | ICD-10-CM

## 2012-03-09 NOTE — Patient Instructions (Signed)
Your physician recommends that you schedule a follow-up appointment in: 3 months with Dr. Cooper.  

## 2012-03-10 ENCOUNTER — Encounter: Payer: Self-pay | Admitting: *Deleted

## 2012-03-10 LAB — BASIC METABOLIC PANEL
CO2: 29 mEq/L (ref 19–32)
Chloride: 105 mEq/L (ref 96–112)
Creatinine, Ser: 1.1 mg/dL (ref 0.4–1.2)
Potassium: 3.9 mEq/L (ref 3.5–5.1)

## 2012-03-12 ENCOUNTER — Encounter: Payer: Self-pay | Admitting: Gastroenterology

## 2012-03-12 ENCOUNTER — Ambulatory Visit (INDEPENDENT_AMBULATORY_CARE_PROVIDER_SITE_OTHER): Payer: No Typology Code available for payment source | Admitting: Gastroenterology

## 2012-03-12 VITALS — BP 134/62 | HR 76 | Ht 65.0 in | Wt 233.0 lb

## 2012-03-12 DIAGNOSIS — K219 Gastro-esophageal reflux disease without esophagitis: Secondary | ICD-10-CM

## 2012-03-12 DIAGNOSIS — R0789 Other chest pain: Secondary | ICD-10-CM

## 2012-03-12 MED ORDER — CELECOXIB 200 MG PO CAPS: 200.0000 mg | ORAL_CAPSULE | Freq: Every day | ORAL | Status: DC

## 2012-03-12 NOTE — Patient Instructions (Signed)
We have sent the following medications to your pharmacy for you to pick up at your convenience: Celebrex. cc: Berniece Andreas, MD

## 2012-03-12 NOTE — Progress Notes (Signed)
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 She has been on colchicine twice a day for one month without improvement in her left precordial chest pain, and has recently had to decrease the dosage to once a day because of diarrhea. She has chronic acid reflux with a known large hiatal hernia, and is asymptomatic on Protonix 40 mg a day. She denies current gastrointestinal or hepatobiliary problems. She has not used NSAIDs because of concerns about her gastrointestinal tract.    Current Medications, Allergies, Past Medical History, Past Surgical History, Family History and Social History were reviewed in [employer] record.   Assessment and plan: I have prescribed Celebrex as a Cox 2 anti-inflammatory once a day which she should be a tall right especially while on daily PPI therapy. We have decided to stop her colchicine, but she is to continue regular followup with Dr. Excell Seltzer in cardiology as planned, continue her other medications as listed and reviewed, and to continue use when necessary Carafate suspension. No diagnosis found.

## 2012-03-18 ENCOUNTER — Encounter: Payer: Self-pay | Admitting: Cardiovascular Disease

## 2012-03-18 NOTE — Progress Notes (Signed)
HPI:  This is a 61 year old woman presenting for followup evaluation. She has presented twice now with typical symptoms of acute coronary syndrome and cardiac enzyme elevation. She's undergone cardiac catheterization x2, demonstrating no significant CAD. Her echocardiogram studies have showed no significant LV dysfunction. She had a cardiac MRI study to evaluate for findings of myocarditis and this showed punctate hyperenhancement in the mid epicardium, possibly consistent with focal myocarditis. The patient did not tolerate nonsteroidal anti-inflammatories. She was laced on a calcium channel blocker rather than a beta blocker because there was some concern for coronary vasospasm when she presented the second time. She has been on colchicine but she complains of diarrhea.  She continues to have near constant discomfort in left side of her chest. Overall this is improving but it still bothers her to a degree. She denies dyspnea, orthopnea, PND, or palpitations. She denies leg edema.  She is concerned about returning to work as she feels that stress led to her second event.  Outpatient Encounter Prescriptions as of 03/09/2012  Medication Sig Dispense Refill  . Cholecalciferol (VITAMIN D) 400 UNITS capsule Take 400 Units by mouth daily.        Marland Kitchen diltiazem (CARDIZEM CD) 120 MG 24 hr capsule Take 1 capsule (120 mg total) by mouth daily.  30 capsule  6  . FLUoxetine (PROZAC) 10 MG capsule TAKE ONE CAPSULE BY MOUTH EVERY DAY  30 capsule  3  . levothyroxine (SYNTHROID, LEVOTHROID) 100 MCG tablet TAKE 1 TABLET BY MOUTH EVERY DAY  30 tablet  3  . lisinopril (PRINIVIL,ZESTRIL) 10 MG tablet Take 1 tablet (10 mg total) by mouth daily.  30 tablet  6  . Loratadine (CLARITIN) 10 MG CAPS Take 10 mg by mouth daily as needed. As needed for sinus      . nitroGLYCERIN (NITROSTAT) 0.4 MG SL tablet Place 1 tablet (0.4 mg total) under the tongue every 5 (five) minutes x 3 doses as needed for chest pain.  25 tablet  3  .  pantoprazole (PROTONIX) 40 MG tablet Take 1 tablet (40 mg total) by mouth daily.  30 tablet  10  . sucralfate (CARAFATE) 1 GM/10ML suspension Take 1 g by mouth 4 (four) times daily. As needed  For burning sensation      . DISCONTD: colchicine 0.6 MG tablet Take 1 tablet (0.6 mg total) by mouth 2 (two) times daily.  60 tablet  2  . DISCONTD: aspirin 81 MG tablet Take 81 mg by mouth daily.        No Known Allergies  Past Medical History  Diagnosis Date  . Allergic rhinitis   . Depression   . GERD (gastroesophageal reflux disease)   . Hiatal hernia   . Diverticulosis of colon (without mention of hemorrhage)   . Hypothyroidism   . IBS (irritable bowel syndrome)   . Anxiety   . Hemorrhoids   . NSTEMI (non-ST elevated myocardial infarction)     a. 12/2011 with peak troponin 2. Cath - mild nonobstructive plaque.;  b. nstemi 01/2012, relook cath - nonobs - MRI sugg of myocarditis  . Obesity   . Transaminitis     Abd [place] 12/2011 - hepatic steatosis  . Pulmonary HTN     Enlarged pulmonary artery by CT angio 12/2011 - PAP by echo 12/2011  . Myocarditis     a. 2 sm areas of gad uptake sugg of myocarditis on MRI 01/14/12  . Elevated LFTs     ROS: Negative except  as per HPI  BP 119/78  Pulse 86  Ht 5\' 5"  (1.651 m)  Wt 105.235 kg (232 lb)  BMI 38.61 kg/m2  PHYSICAL EXAM: Pt is alert and oriented, overweight woman in NAD HEENT: normal Neck: JVP - normal, carotids 2+= without bruits Lungs: CTA bilaterally CV: RRR without murmur or gallop Abd: soft, NT, Positive BS, obese Ext: no C/C/E, distal pulses intact and equal Skin: warm/dry no rash  ASSESSMENT AND PLAN: Chronic chest pain with recent episodes associated with increased cardiac enzymes. Possible focal myocarditis based on cardiac MRI findings. The patient will reduce colchicine to 0.1 mg daily. Overall she is slowly improving. She has not tolerated nonsteroidal anti-inflammatory drugs. Will extend her return to work date. I would  like to see her back in 3 months for followup.  Tonny Bollman 03/18/2012 3:03 PM

## 2012-04-09 ENCOUNTER — Telehealth: Payer: Self-pay | Admitting: Cardiovascular Disease

## 2012-04-09 NOTE — Telephone Encounter (Addendum)
Pt calling re having episodes all week, heart having a burning sensation and a pain that goes from heart all the way to her back, lasting 30-60 mintues, pt taking 1 celebrex a day and not taking the gout med anymore

## 2012-04-09 NOTE — Telephone Encounter (Signed)
Per Dr Excell Seltzer the pt should continue her current medications and monitor symptoms.  No changes recommended at this time. Pt aware and will contact the office with questions or concerns.

## 2012-04-09 NOTE — Telephone Encounter (Signed)
I spoke with the pt and and this is her first week back at work full time.  This week the pt has noticed a burning sensation in her chest and pain that radiates into her back.  This occurs once a day between 6 PM and 10 PM.  Episodes usually last about 30-60 minutes.  The pt did not notice any symptoms when she was working half days.  The pt is not longer taking Colchicine, but was started on Celebrex 200mg  daily by Dr Jarold Motto. I will forward this message to Dr Excell Seltzer for further recommendations.

## 2012-05-24 ENCOUNTER — Other Ambulatory Visit: Payer: Self-pay | Admitting: Internal Medicine

## 2012-05-24 NOTE — Telephone Encounter (Signed)
Not seen in over a year.  Needs OV scheduled before more can be refilled.

## 2012-05-25 ENCOUNTER — Other Ambulatory Visit: Payer: Self-pay | Admitting: Family Medicine

## 2012-05-25 ENCOUNTER — Ambulatory Visit (INDEPENDENT_AMBULATORY_CARE_PROVIDER_SITE_OTHER): Payer: No Typology Code available for payment source | Admitting: Internal Medicine

## 2012-05-25 ENCOUNTER — Encounter: Payer: Self-pay | Admitting: Internal Medicine

## 2012-05-25 VITALS — BP 104/70 | HR 102 | Temp 98.2°F | Wt 237.0 lb

## 2012-05-25 DIAGNOSIS — E669 Obesity, unspecified: Secondary | ICD-10-CM

## 2012-05-25 DIAGNOSIS — R7989 Other specified abnormal findings of blood chemistry: Secondary | ICD-10-CM

## 2012-05-25 DIAGNOSIS — R0789 Other chest pain: Secondary | ICD-10-CM

## 2012-05-25 DIAGNOSIS — F329 Major depressive disorder, single episode, unspecified: Secondary | ICD-10-CM

## 2012-05-25 DIAGNOSIS — Z79899 Other long term (current) drug therapy: Secondary | ICD-10-CM | POA: Insufficient documentation

## 2012-05-25 DIAGNOSIS — I514 Myocarditis, unspecified: Secondary | ICD-10-CM

## 2012-05-25 DIAGNOSIS — E039 Hypothyroidism, unspecified: Secondary | ICD-10-CM

## 2012-05-25 MED ORDER — FLUOXETINE HCL 10 MG PO CAPS: 10.0000 mg | ORAL_CAPSULE | Freq: Every day | ORAL | Status: DC

## 2012-05-25 NOTE — Progress Notes (Signed)
Subjective:    Patient ID: Shelby Wong, female    DOB: 09-24-1951, 61 y.o.   MRN: [id number]  HPI Patient comes in today for SDA for  problem evaluation. And medication refill. Last visit with me was over a year ago but she has seen a number of other physicians.  Since last visit she has had evaluation for chest pain   Has had 2 caths:  final dx was myocarditis.  But had dx of NSTEMI  With nl  ekg but chest pain and abnormal enzymes  . There were also abnormal liver enzymes it eventually resolved. She is following up with cardiology on colchicine because she cannot tolerate anti-inflammatories from the stomach point of view.   needs refill of prozac     . She is on low dose apparently no side effect and feels like she breeze through a lot of the stressful events pretty well. She is taking her thyroid medicine daily without significant problem she's had a number of laboratory studies done for her hospitalizations and specialty visits.  She has only recently gone back to work a month or so ago still has some i fatigue exercise intolerance and occasional chest pain.   Review of Systems  Outpatient Encounter Prescriptions as of 05/25/2012  Medication Sig Dispense Refill  . Cholecalciferol (VITAMIN D) 400 UNITS capsule Take 400 Units by mouth daily.        . colchicine 0.6 MG tablet Take 0.6 mg by mouth daily.      Marland Kitchen diltiazem (CARDIZEM CD) 120 MG 24 hr capsule Take 1 capsule (120 mg total) by mouth daily.  30 capsule  6  . FLUoxetine (PROZAC) 10 MG capsule TAKE ONE CAPSULE BY MOUTH EVERY DAY  30 capsule  3  . levothyroxine (SYNTHROID, LEVOTHROID) 100 MCG tablet TAKE 1 TABLET BY MOUTH EVERY DAY  30 tablet  3  . lisinopril (PRINIVIL,ZESTRIL) 10 MG tablet Take 1 tablet (10 mg total) by mouth daily.  30 tablet  6  . Loratadine (CLARITIN) 10 MG CAPS Take 10 mg by mouth daily as needed. As needed for sinus      . nitroGLYCERIN (NITROSTAT) 0.4 MG SL tablet Place 1 tablet (0.4 mg total) under the tongue  every 5 (five) minutes x 3 doses as needed for chest pain.  25 tablet  3  . pantoprazole (PROTONIX) 40 MG tablet Take 1 tablet (40 mg total) by mouth daily.  30 tablet  10  . sucralfate (CARAFATE) 1 GM/10ML suspension Take 1 g by mouth 4 (four) times daily. As needed  For burning sensation      . DISCONTD: celecoxib (CELEBREX) 200 MG capsule Take 1 capsule (200 mg total) by mouth daily.  30 capsule  5  Past history family history social history reviewed in the electronic medical record.      Objective:   Physical Exam BP 104/70  Pulse 102  Temp 98.2 F (36.8 C) (Oral)  Wt 237 lb (107.502 kg)  SpO2 97% Wt Readings from Last 3 Encounters:  05/25/12 237 lb (107.502 kg)  03/12/12 233 lb (105.688 kg)  03/09/12 232 lb (105.235 kg)   WDWN in nad  Looks well   And not depressed animated and talkative not hurting today. Neck supple without bruit or adenopathy chest clear to auscultation cardiac S1-S2 no rubs or gallops are heard. Abdomen soft without organomegaly guarding or rebound Skin: normal capillary refill ,turgor , color: No acute rashes ,petechiae or bruising some facial flush No clubbing cyanosis  or edema  Lab Results  Component Value Date   WBC 6.0 01/17/2012   HGB 12.2 01/17/2012   HCT 36.6 01/17/2012   PLT 181 01/17/2012   GLUCOSE 87 03/09/2012   CHOL 147 12/15/2011   TRIG 107 12/15/2011   HDL 48 12/15/2011   LDLCALC 78 12/15/2011   ALT 34 01/15/2012   AST 43* 01/15/2012   NA 141 03/09/2012   K 3.9 03/09/2012   CL 105 03/09/2012   CREATININE 1.1 03/09/2012   BUN 18 03/09/2012   CO2 29 03/09/2012   TSH 3.205 01/13/2012   INR 1.16 01/16/2012       Assessment & Plan:    Mood   on low-dose Prozac at this time she's stable and has gone through a lot of medical evaluation no apparent side effect would continue at this time and discussed in to continuation at her next visit. Refill medication for 6 months. S/p extensive eval for chest pain   final diagnoses; myocarditis  Esophageal Reflux    Pain  on nsaids and  celebrex   On colchicine  History of abnormal LFTs improved does have underlying hepatic steatosis  Obesity  as been somewhat inactive and has gained weight plans on losing weight to help with her medical issues. Total visit > 50% spent counseling and coordinating care

## 2012-05-25 NOTE — Patient Instructions (Signed)
Continue on fluoxetine  For  Now   CPX and labs in 6 months or as needed.

## 2012-06-01 ENCOUNTER — Other Ambulatory Visit: Payer: Self-pay | Admitting: Internal Medicine

## 2012-06-11 ENCOUNTER — Ambulatory Visit (INDEPENDENT_AMBULATORY_CARE_PROVIDER_SITE_OTHER): Payer: No Typology Code available for payment source | Admitting: Cardiovascular Disease

## 2012-06-11 ENCOUNTER — Encounter: Payer: Self-pay | Admitting: Cardiovascular Disease

## 2012-06-11 VITALS — BP 128/82 | HR 88 | Resp 16 | Ht 65.0 in | Wt 241.0 lb

## 2012-06-11 DIAGNOSIS — I10 Essential (primary) hypertension: Secondary | ICD-10-CM

## 2012-06-11 NOTE — Patient Instructions (Addendum)
Your physician wants you to follow-up in: 12 months.   You will receive a reminder letter in the mail two months in advance. If you don't receive a letter, please call our office to schedule the follow-up appointment.  Your physician has recommended you make the following change in your medication: STOP your Colchicine

## 2012-06-15 ENCOUNTER — Encounter: Payer: Self-pay | Admitting: Cardiovascular Disease

## 2012-06-15 DIAGNOSIS — I10 Essential (primary) hypertension: Secondary | ICD-10-CM | POA: Insufficient documentation

## 2012-06-15 NOTE — Assessment & Plan Note (Signed)
Current chest pain is highly atypical and suggests a noncardiac origin, possibly chest wall. I think she can stop colchicine at this point. If pain changes in character or worsens, she could always go back on if needed. Continue to work on risk modification, exercise, diet.

## 2012-06-15 NOTE — Progress Notes (Signed)
HPI:  This is a 61 year old woman presenting for followup evaluation. She has presented twice now with typical symptoms of acute coronary syndrome and cardiac enzyme elevation. She's undergone cardiac catheterization x2, demonstrating no significant CAD. Her echocardiogram studies have showed no significant LV dysfunction. She had a cardiac MRI study to evaluate for findings of myocarditis and this showed punctate hyperenhancement in the mid epicardium, possibly consistent with focal myocarditis. The patient did not tolerate nonsteroidal anti-inflammatories. She remains on once daily colchicine.  She is doing much better. She's been back to work now for some time and is doing fine. She still has a nagging chest pain over a focal area in the left chest. It is mild, but she notices it often. She has no exertional symptoms. No dyspnea, fever, chills, orthopnea, PND, or palpitations. She has leg swelling progressive as the day goes on.  Outpatient Encounter Prescriptions as of 06/11/2012  Medication Sig Dispense Refill  . Cholecalciferol (VITAMIN D) 400 UNITS capsule Take 400 Units by mouth daily.        Marland Kitchen diltiazem (CARDIZEM CD) 120 MG 24 hr capsule Take 1 capsule (120 mg total) by mouth daily.  30 capsule  6  . FLUoxetine (PROZAC) 10 MG capsule Take 1 capsule (10 mg total) by mouth daily.  30 capsule  5  . levothyroxine (SYNTHROID, LEVOTHROID) 100 MCG tablet TAKE 1 TABLET BY MOUTH EVERY DAY  30 tablet  3  . lisinopril (PRINIVIL,ZESTRIL) 10 MG tablet Take 1 tablet (10 mg total) by mouth daily.  30 tablet  6  . Loratadine (CLARITIN) 10 MG CAPS Take 10 mg by mouth daily as needed. As needed for sinus      . pantoprazole (PROTONIX) 40 MG tablet Take 1 tablet (40 mg total) by mouth daily.  30 tablet  10  . DISCONTD: colchicine 0.6 MG tablet Take 0.6 mg by mouth daily.      . nitroGLYCERIN (NITROSTAT) 0.4 MG SL tablet Place 1 tablet (0.4 mg total) under the tongue every 5 (five) minutes x 3 doses as needed  for chest pain.  25 tablet  3  . sucralfate (CARAFATE) 1 GM/10ML suspension Take 1 g by mouth 4 (four) times daily. As needed  For burning sensation        No Known Allergies  Past Medical History  Diagnosis Date  . Allergic rhinitis   . Depression   . GERD (gastroesophageal reflux disease)   . Hiatal hernia   . Diverticulosis of colon (without mention of hemorrhage)   . Hypothyroidism   . IBS (irritable bowel syndrome)   . Anxiety   . Hemorrhoids   . NSTEMI (non-ST elevated myocardial infarction)     a. 12/2011 with peak troponin 2. Cath - mild nonobstructive plaque.;  b. nstemi 01/2012, relook cath - nonobs - MRI sugg of myocarditis  . Obesity   . Transaminitis     Abd [place] 12/2011 - hepatic steatosis  . Pulmonary HTN     Enlarged pulmonary artery by CT angio 12/2011 - PAP by echo 12/2011  . Myocarditis     a. 2 sm areas of gad uptake sugg of myocarditis on MRI 01/14/12  . Elevated LFTs    BP 128/82  Pulse 88  Resp 16  Ht 5\' 5"  (1.651 m)  Wt 109.317 kg (241 lb)  BMI 40.10 kg/m2  PHYSICAL EXAM: Pt is alert and oriented, pleasant obese woman in NAD HEENT: normal Neck: JVP - normal, carotids 2+= without bruits  Lungs: CTA bilaterally CV: RRR without murmur or gallop Abd: soft, NT, Positive BS, no hepatomegaly Ext: trace edema bilaterally, distal pulses intact and equal Skin: warm/dry no rash  EKG: NSR 77 bpm, within normal limits  ASSESSMENT AND PLAN:

## 2012-06-15 NOTE — Assessment & Plan Note (Signed)
We discussed need to continue on antihypertensive medications. BP is well-controlled but I suspect she will need to stay on pharmacotherapy.

## 2012-07-09 ENCOUNTER — Encounter: Payer: Self-pay | Admitting: Internal Medicine

## 2012-07-09 ENCOUNTER — Ambulatory Visit (INDEPENDENT_AMBULATORY_CARE_PROVIDER_SITE_OTHER): Payer: No Typology Code available for payment source | Admitting: Internal Medicine

## 2012-07-09 VITALS — BP 132/86 | HR 102 | Temp 98.4°F | Wt 242.0 lb

## 2012-07-09 DIAGNOSIS — B029 Zoster without complications: Secondary | ICD-10-CM

## 2012-07-09 HISTORY — DX: Zoster without complications: B02.9

## 2012-07-09 MED ORDER — VALACYCLOVIR HCL 1 G PO TABS: 1000.0000 mg | ORAL_TABLET | Freq: Three times a day (TID) | ORAL | Status: DC

## 2012-07-09 MED ORDER — PREGABALIN 50 MG PO CAPS: 50.0000 mg | ORAL_CAPSULE | Freq: Three times a day (TID) | ORAL | Status: DC

## 2012-07-09 NOTE — Patient Instructions (Signed)
Begin medication as discussed . Ok to take clelbrex but if needed may add lyrica twice a day and can increase as needed.  Shingles Shingles is caused by the same virus that causes chickenpox (varicella zoster virus or VZV). Shingles often occurs many years or decades after having chickenpox. That is why it is more common in adults older than 50 years. The virus reactivates and breaks out as an infection in a nerve root. SYMPTOMS   The initial feeling (sensations) may be pain. This pain is usually described as:   Burning.   Stabbing.   Throbbing.   Tingling in the nerve root.   A red rash will follow in a couple days. The rash may occur in any area of the body and is usually on one side (unilateral) of the body in a band or belt-like pattern. The rash usually starts out as very small blisters (vesicles). They will dry up after 7 to 10 days. This is not usually a significant problem except for the pain it causes.   Long-lasting (chronic) pain is more likely in an elderly person. It can last months to years. This condition is called postherpetic neuralgia.  Shingles can be an extremely severe infection in someone with AIDS, a weakened immune system, or with forms of leukemia. It can also be severe if you are taking transplant medicines or other medicines that weaken the immune system. TREATMENT  Your caregiver will often treat you with:  Antiviral drugs.   Anti-inflammatory drugs.   Pain medicines.  Bed rest is very important in preventing the pain associated with herpes zoster (postherpetic neuralgia). Application of heat in the form of a hot water bottle or electric heating pad or gentle pressure with the hand is recommended to help with the pain or discomfort. PREVENTION  A varicella zoster vaccine is available to help protect against the virus. The Food and Drug Administration approved the varicella zoster vaccine for individuals 66 years of age and older. HOME CARE INSTRUCTIONS    Cool compresses to the area of rash may be helpful.   Only take over-the-counter or prescription medicines for pain, discomfort, or fever as directed by your caregiver.   Avoid contact with:   Babies.   Pregnant women.   Children with eczema.   Elderly people with transplants.   People with chronic illnesses, such as leukemia and AIDS.   If the area involved is on your face, you may receive a referral for follow-up to a specialist. It is very important to keep all follow-up appointments. This will help avoid eye complications, chronic pain, or disability.  SEEK IMMEDIATE MEDICAL CARE IF:   You develop any pain (headache) in the area of the face or eye. This must be followed carefully by your caregiver or ophthalmologist. An infection in part of your eye (cornea) can be very serious. It could lead to blindness.   You do not have pain relief from prescribed medicines.   Your redness or swelling spreads.   The area involved becomes very swollen and painful.   You have a fever.   You notice any red or painful lines extending away from the affected area toward your heart (lymphangitis).   Your condition is worsening or has changed.  Document Released: 10/27/2005 Document Revised: 10/16/2011 Document Reviewed: 10/01/2009 [facility] Patient Information 2012 [place], [place].

## 2012-07-09 NOTE — Progress Notes (Signed)
  Subjective:    Patient ID: Shelby Wong, female    DOB: 12-04-1950, 61 y.o.   MRN: [id number]  HPI Patient comes in today for an acute office visit. She had some discomfort in her right shoulder blade and took some Celebrex yesterday this morning she noted a rash in the right chest area near her breast on the right side is described as a stinging discomfort pain. She looked online and had some concern that it could be shingles and called in. She denies any fever new chest pain shortness of breath. No other unusual rashes. Doesn't take Celebrex regularly because it does bother her stomach.  Review of Systems Negative for fever coughing shortness of breath difference was at work this morning before she came in Past history family history social history reviewed in the electronic medical record. h x of varicella    Objective:   Physical Exam  BP 132/86  Pulse 102  Temp 98.4 F (36.9 C) (Oral)  Wt 242 lb (109.77 kg)  SpO2 96% Well-developed well-nourished in no acute distress Examination of scan right chest shows about a 3-4 cm linear patch of vesicles without ulceration. Back shows no rash maybe scant erythema near her bra .  Good range of motion Cardiovascular pulmonary grossly normal.     Assessment & Plan:  `  Early acute shingles most likely diagnosis.  Discussed course of disease diagnosis expectant management and management. Begin Valtrex immediately thousand milligrams 3 times a day for 7 days  Can use Celebrex or Tylenol as needed samples given of Lyrica 50 mg that she can take 1    2-3 times a day and we can work her way up if needed this is a 3 day holiday weekend she can call next week if the pain is not manageable or as needed.  The risk for transmission discussed. Contact of alarm features.

## 2012-07-14 ENCOUNTER — Telehealth: Payer: Self-pay | Admitting: Internal Medicine

## 2012-07-14 NOTE — Telephone Encounter (Signed)
Caller: [name]/Patient; Patient Name: Shelby Wong; PCP: Berniece Andreas [facility]); Best Callback Phone Number: [phone]. Caller reports she was seen in the office on Friday 8/30 and diagnosised with shingles. She has taken the medication as directed and pain is controlled with Lyrica. Caller asking about exposing her hair stylist on Saturday 8/31. She has since found out this young lady is pregnant and asking if she needs to notify her that she has shingles. Caller given info from [facility] Info files and advised she should call hairstylist and advise of her recent diagnosis. Caller will do so and was advised to callback as needed.

## 2012-07-16 NOTE — Addendum Note (Signed)
Addended by: Lacie Scotts on: 07/16/2012 03:53 PM   Modules accepted: Orders

## 2012-07-21 ENCOUNTER — Telehealth: Payer: Self-pay | Admitting: Internal Medicine

## 2012-07-21 NOTE — Telephone Encounter (Signed)
Pt called and has questions re: shingles. Pt was given samples of Lyrica and is just about out. Req additional samples.

## 2012-07-21 NOTE — Telephone Encounter (Signed)
Ok to give more sample 50 mg  lyrica 14  Document how her pain is doing.    We can increase to 75 mg if having too much pain  And or write a scrip.

## 2012-07-21 NOTE — Telephone Encounter (Signed)
Do you have samples of Lyrica?

## 2012-07-22 NOTE — Telephone Encounter (Signed)
Pt notified of sample and placed upfront.  She states her pain is much better but the area becomes painful if clothes, blankets etc touch it.  She will call back if sx worsen.

## 2012-08-11 ENCOUNTER — Other Ambulatory Visit (HOSPITAL_COMMUNITY): Payer: Self-pay | Admitting: Nurse Practitioner

## 2012-09-16 ENCOUNTER — Other Ambulatory Visit: Payer: Self-pay | Admitting: Cardiovascular Disease

## 2012-09-28 ENCOUNTER — Other Ambulatory Visit: Payer: Self-pay | Admitting: Internal Medicine

## 2012-11-16 ENCOUNTER — Other Ambulatory Visit: Payer: No Typology Code available for payment source

## 2012-11-23 ENCOUNTER — Encounter: Payer: No Typology Code available for payment source | Admitting: Family Medicine

## 2012-11-28 ENCOUNTER — Other Ambulatory Visit: Payer: Self-pay | Admitting: Internal Medicine

## 2012-12-22 ENCOUNTER — Other Ambulatory Visit (INDEPENDENT_AMBULATORY_CARE_PROVIDER_SITE_OTHER): Payer: No Typology Code available for payment source

## 2012-12-22 DIAGNOSIS — Z0000 Encounter for general adult medical examination without abnormal findings: Secondary | ICD-10-CM

## 2012-12-22 LAB — BASIC METABOLIC PANEL
BUN: 13 mg/dL (ref 6–23)
CO2: 28 mEq/L (ref 19–32)
Chloride: 103 mEq/L (ref 96–112)
Glucose, Bld: 90 mg/dL (ref 70–99)
Potassium: 3.9 mEq/L (ref 3.5–5.1)

## 2012-12-22 LAB — CBC WITH DIFFERENTIAL/PLATELET
Basophils Relative: 0.5 % (ref 0.0–3.0)
Eosinophils Relative: 5 % (ref 0.0–5.0)
Lymphocytes Relative: 27 % (ref 12.0–46.0)
MCV: 88.5 fl (ref 78.0–100.0)
Monocytes Relative: 9.6 % (ref 3.0–12.0)
Neutrophils Relative %: 57.9 % (ref 43.0–77.0)
RBC: 4.09 Mil/uL (ref 3.87–5.11)
WBC: 4.8 10*3/uL (ref 4.5–10.5)

## 2012-12-22 LAB — HEPATIC FUNCTION PANEL
Albumin: 3.6 g/dL (ref 3.5–5.2)
Total Bilirubin: 1.5 mg/dL — ABNORMAL HIGH (ref 0.3–1.2)

## 2012-12-22 LAB — LIPID PANEL
HDL: 38 mg/dL — ABNORMAL LOW (ref >39.00)
LDL Cholesterol: 104 mg/dL — ABNORMAL HIGH (ref 0–99)
VLDL: 15.8 mg/dL (ref 0.0–40.0)

## 2012-12-22 LAB — TSH: TSH: 2.51 u[IU]/mL (ref 0.35–5.50)

## 2012-12-29 ENCOUNTER — Ambulatory Visit (INDEPENDENT_AMBULATORY_CARE_PROVIDER_SITE_OTHER): Payer: No Typology Code available for payment source | Admitting: Internal Medicine

## 2012-12-29 ENCOUNTER — Encounter: Payer: Self-pay | Admitting: Internal Medicine

## 2012-12-29 ENCOUNTER — Other Ambulatory Visit (HOSPITAL_COMMUNITY)
Admission: RE | Admit: 2012-12-29 | Discharge: 2012-12-29 | Disposition: A | Discharge status: Home / Self Care | Payer: No Typology Code available for payment source | Source: Ambulatory Visit | Attending: Internal Medicine | Admitting: Internal Medicine

## 2012-12-29 VITALS — BP 130/90 | HR 104 | Temp 98.2°F | Ht 64.0 in | Wt 239.0 lb

## 2012-12-29 DIAGNOSIS — Z1151 Encounter for screening for human papillomavirus (HPV): Secondary | ICD-10-CM | POA: Insufficient documentation

## 2012-12-29 DIAGNOSIS — I119 Hypertensive heart disease without heart failure: Secondary | ICD-10-CM

## 2012-12-29 DIAGNOSIS — E063 Autoimmune thyroiditis: Secondary | ICD-10-CM

## 2012-12-29 DIAGNOSIS — Z01419 Encounter for gynecological examination (general) (routine) without abnormal findings: Secondary | ICD-10-CM | POA: Insufficient documentation

## 2012-12-29 DIAGNOSIS — I10 Essential (primary) hypertension: Secondary | ICD-10-CM

## 2012-12-29 DIAGNOSIS — E786 Lipoprotein deficiency: Secondary | ICD-10-CM

## 2012-12-29 DIAGNOSIS — K449 Diaphragmatic hernia without obstruction or gangrene: Secondary | ICD-10-CM

## 2012-12-29 DIAGNOSIS — K219 Gastro-esophageal reflux disease without esophagitis: Secondary | ICD-10-CM

## 2012-12-29 DIAGNOSIS — Z0000 Encounter for general adult medical examination without abnormal findings: Secondary | ICD-10-CM

## 2012-12-29 NOTE — Progress Notes (Signed)
Chief Complaint  Patient presents with  . Annual Exam  . Hypertension    HPI: Patient comes in today for Preventive Health Care visit  No major change in health status since last visit . Has problems with recurrent sinus congestion  Sees cards as needed not heart issue upt to pt. 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 Not having problems today but did have a couple weeks of some mild early left-sided chest pain that is now better after taking Celebrex. No shortness of breath or cough. It was not recommended she go on statin medicine based on the cause of her chest pain. Was not felt to be coronary disease.  gerd ; on meds  Inc if takes nsaids. Hiatal hernia. Healthcare maintenance really hasn't had time to get her mammogram because of all the medical workup she had and had to take time off of work. She is also due for Pap smear. No GYN symptoms. Blood pressure apparently controlled on current medications.  Thyroid taking medication daily. Mood on fluoxetine stable.  ROS:  GEN/ HEENT: No fever, significant weight changes sweats headaches vision problems hearing changes, CV/ PULM; No chest pain shortness of breath cough, syncope,edema  change in exercise tolerance. GI /GU: No adominal pain, vomiting, change in bowel habits. No blood in the stool. No significant GU symptoms. SKIN/HEME: ,no acute skin rashes suspicious lesions or bleeding. No lymphadenopathy, nodules, masses.  NEURO/ PSYCH:  No neurologic signs such as weakness numbness. No depression anxiety. IMM/ Allergy: No unusual infections.  Allergy .   REST of 12 system review negative except as per HPI   Past Medical History  Diagnosis Date  . Allergic rhinitis   . Depression   . GERD (gastroesophageal reflux disease)   . Hiatal hernia   . Diverticulosis of colon (without mention of hemorrhage)   . Hypothyroidism   . IBS  (irritable bowel syndrome)   . Anxiety   . Hemorrhoids   . NSTEMI (non-ST elevated myocardial infarction)     a. 12/2011 with peak troponin 2. Cath - mild nonobstructive plaque.;  b. nstemi 01/2012, relook cath - nonobs - MRI sugg of myocarditis  . Obesity   . Transaminitis     Abd [place] 12/2011 - hepatic steatosis  . Pulmonary HTN     Enlarged pulmonary artery by CT angio 12/2011 - PAP by echo 12/2011  . Myocarditis     a. 2 sm areas of gad uptake sugg of myocarditis on MRI 01/14/12  . Elevated LFTs   . Shingles 07/09/2012  . POLYP, ANAL AND RECTAL 09/07/2007    Qualifier: Diagnosis of  By: Lawernce Ion, CMA (AAMA), Bethann Berkshire   . NSTEMI, initial episode of care 12/17/2011    Family History  Problem Relation Age of Onset  . Arthritis Mother     osteo  . Breast cancer Mother   . Coronary artery disease Mother   . Other Mother     hearing loss  . Diabetes Brother     50 s  . Thyroid disease Mother   . Colon cancer Neg Hx     History   Social History  . Marital Status: Divorced    Spouse Name: N/A    Number of Children: 1  . Years of Education: N/A   Occupational History  . Direct Sales Rep    Social History Main Topics  . Smoking status: Former Smoker  Quit date: 09/11/2006  . Smokeless tobacco: Never Used  . Alcohol Use: Yes     Comment: occassional  . Drug Use: No  . Sexually Active: Not Currently    Birth Control/ Protection: Post-menopausal   Other Topics Concern  . 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 Cholecalciferol (VITAMIN D) 400 UNITS capsule Take 400 Units by mouth daily.        Marland Kitchen diltiazem (CARDIZEM CD) 120 MG 24 hr capsule TAKE 1 CAPSULE (120 MG TOTAL) BY MOUTH DAILY.   30 capsule  6  . FLUoxetine (PROZAC) 10 MG capsule TAKE ONE CAPSULE BY MOUTH EVERY DAY  30 capsule  5  . levothyroxine (SYNTHROID, LEVOTHROID) 100 MCG tablet TAKE 1 TABLET BY MOUTH EVERY DAY  30 tablet  3  . lisinopril (PRINIVIL,ZESTRIL) 10 MG tablet TAKE 1 TABLET (10 MG TOTAL) BY MOUTH DAILY.  30 tablet  6  . Loratadine (CLARITIN) 10 MG CAPS Take 10 mg by mouth daily as needed. As needed for sinus      . pantoprazole (PROTONIX) 40 MG tablet Take 1 tablet (40 mg total) by mouth daily.  30 tablet  10  . [DISCONTINUED] nitroGLYCERIN (NITROSTAT) 0.4 MG SL tablet Place 1 tablet (0.4 mg total) under the tongue every 5 (five) minutes x 3 doses as needed for chest pain.  25 tablet  3  . [DISCONTINUED] pregabalin (LYRICA) 50 MG capsule Take 1 capsule (50 mg total) by mouth 3 (three) times daily. For shingles pain  21 capsule  0  . [DISCONTINUED] sucralfate (CARAFATE) 1 GM/10ML suspension Take 1 g by mouth 4 (four) times daily. As needed  For burning sensation      . [DISCONTINUED] valACYclovir (VALTREX) 1000 MG tablet Take 1 tablet (1,000 mg total) by mouth 3 (three) times daily.  21 tablet  0   No facility-administered encounter medications on file as of 12/29/2012.    EXAM:  BP 130/90  Pulse 104  Temp(Src) 98.2 F (36.8 C) (Oral)  Ht 5\' 4"  (1.626 m)  Wt 239 lb (108.41 kg)  BMI 41 kg/m2  SpO2 96%  Body mass index is 41 kg/(m^2).  Physical Exam: Vital signs reviewed [id number] is a well-developed well-nourished alert cooperative   female who appears her stated age in no acute distress.  HEENT: normocephalic atraumatic , Eyes: PERRL EOM's full, conjunctiva clear, Nares: paten,t no deformity discharge or tenderness., Ears: no deformity EAC's clear TMs with normal landmarks. Mouth: clear OP, no lesions, edema.  Moist mucous membranes. Dentition in adequate repair. NECK: supple without masses, thyromegaly or bruits. CHEST/PULM:  Clear to auscultation and percussion breath sounds equal no wheeze ,  rales or rhonchi. No chest wall deformities or tenderness. Breast: normal by inspection . No dimpling, discharge, masses, tenderness or discharge . CV: PMI is nondisplaced, S1 S2 no gallops, , rubs. Clear short systolic ejection murmur left upper sternal border that doesn't radiate heard only in the supine position. Peripheral pulses are full without delay.No JVD .  ABDOMEN: Bowel sounds normal nontender  No guard or rebound, no hepato splenomegal no CVA tenderness.  No hernia. Extremtities:  No clubbing cyanosis or edema, no acute joint swelling or redness no focal atrophy some vv no lesions NEURO:  Oriented x3, cranial nerves 3-12 appear to be intact, no obvious focal weakness,gait within normal limits no abnormal reflexes or asymmetrical SKIN: No acute rashes normal turgor, color, no bruising or petechiae. PSYCH: Oriented, good eye contact, no obvious depression anxiety, cognition and judgment appear normal. LN: no cervical axillary inguinal adenopathy Pelvic: NL ext GU, labia clear without lesions or rash . Vagina no lesions .Cervix: clear  UTERUS: Neg CMT Adnexa:  clear no masses . PAP done Rectal no masses  Lab Results  Component Value Date   WBC 4.8 12/22/2012   HGB 12.0 12/22/2012   HCT 36.2 12/22/2012   PLT 230.0 12/22/2012   GLUCOSE 90 12/22/2012   CHOL 158 12/22/2012   TRIG 79.0 12/22/2012   HDL 38.00* 12/22/2012   LDLCALC 104* 12/22/2012   ALT 10 12/22/2012   AST 19 12/22/2012   NA 139 12/22/2012   K 3.9 12/22/2012   CL 103 12/22/2012   CREATININE 0.8 12/22/2012   BUN 13 12/22/2012   CO2 28 12/22/2012   TSH 2.51 12/22/2012   INR 1.16 01/16/2012   Review of echocardiogram done last year during hospitalization showed Study Conclusions  - Left ventricle: The cavity size was normal. Wall thickness was increased in a pattern of mild LVH. Systolic function was normal. The estimated ejection fraction was in the range of 55% to 60%. Wall motion was normal; there were no regional wall motion  abnormalities. - Pulmonary arteries: Systolic pressure was mildly to moderately increased. PA peak pressure: 46mm Hg (S).   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 Patient states she doesn't really need to go back to cardiology however will follow closely her echo did show moderate to mild pulmonary hypertension and changes early of LVH. Followup in 6 months. Patient states she doesn't have coronary disease it was not recommended she have to start a statin medication for that reason. Her LDL is adequate. Patient Care Team: Madelin Headings, MD as PCP - General Mardella Layman, MD (Gastroenterology) Tonny Bollman, MD (Cardiology) Patient Instructions  Get a mammogram.    No changing   medications  Weight  loss and exercise can help the low HDL and your future heath.  Will notify you  of pap  when available.   Preventive Care for Adults, Female A healthy lifestyle and preventive care can promote health and wellness. Preventive health guidelines for women include the following key practices.  A routine yearly physical is a good way to check with your caregiver about your health and preventive screening. It is a chance to share any concerns and updates on your health, and to receive a thorough exam.  Visit your dentist for a routine exam and preventive care every 6 months. Brush your teeth twice a day and floss once a day. Good oral hygiene prevents tooth decay and  gum disease.  The frequency of eye  exams is based on your age, health, family medical history, use of contact lenses, and other factors. Follow your caregiver's recommendations for frequency of eye exams.  Eat a healthy diet. Foods like vegetables, fruits, whole grains, low-fat dairy products, and lean protein foods contain the nutrients you need without too many calories. Decrease your intake of foods high in solid fats, added sugars, and salt. Eat the right amount of calories for you.Get information about a proper diet from your caregiver, if necessary.  Regular physical exercise is one of the most important things you can do for your health. Most adults should get at least 150 minutes of moderate-intensity exercise (any activity that increases your heart rate and causes you to sweat) each week. In addition, most adults need muscle-strengthening exercises on 2 or more days a week.  Maintain a healthy weight. The body mass index (BMI) is a screening tool to identify possible weight problems. It provides an estimate of body fat based on height and weight. Your caregiver can help determine your BMI, and can help you achieve or maintain a healthy weight.For adults 20 years and older:  A BMI below 18.5 is considered underweight.  A BMI of 18.5 to 24.9 is normal.  A BMI of 25 to 29.9 is considered overweight.  A BMI of 30 and above is considered obese.  Maintain normal blood lipids and cholesterol levels by exercising and minimizing your intake of saturated fat. Eat a balanced diet with plenty of fruit and vegetables. Blood tests for lipids and cholesterol should begin at age 44 and be repeated every 5 years. If your lipid or cholesterol levels are high, you are over 50, or you are at high risk for heart disease, you may need your cholesterol levels checked more frequently.Ongoing high lipid and cholesterol levels should be treated with medicines if diet and exercise are not effective.  If you  smoke, find out from your caregiver how to quit. If you do not use tobacco, do not start.  If you are pregnant, do not drink alcohol. If you are breastfeeding, be very cautious about drinking alcohol. If you are not pregnant and choose to drink alcohol, do not exceed 1 drink per day. One drink is considered to be 12 ounces (355 mL) of beer, 5 ounces (148 mL) of wine, or 1.5 ounces (44 mL) of liquor.  Avoid use of street drugs. Do not share needles with anyone. Ask for help if you need support or instructions about stopping the use of drugs.  High blood pressure causes heart disease and increases the risk of stroke. Your blood pressure should be checked at least every 1 to 2 years. Ongoing high blood pressure should be treated with medicines if weight loss and exercise are not effective.  If you are 40 to 60 years old, ask your caregiver if you should take aspirin to prevent strokes.  Diabetes screening involves taking a blood sample to check your fasting blood sugar level. This should be done once every 3 years, after age 29, if you are within normal weight and without risk factors for diabetes. Testing should be considered at a younger age or be carried out more frequently if you are overweight and have at least 1 risk factor for diabetes.  Breast cancer screening is essential preventive care for women. You should practice "breast self-awareness." This means understanding the normal appearance and feel of your breasts and may include breast self-examination. Any changes detected, no matter how small, should be reported  to a caregiver. Women in their 21s and 30s should have a clinical breast exam (CBE) by a caregiver as part of a regular health exam every 1 to 3 years. After age 39, women should have a CBE every year. Starting at age 46, women should consider having a mammography (breast X-ray test) every year. Women who have a family history of breast cancer should talk to their caregiver about genetic  screening. Women at a high risk of breast cancer should talk to their caregivers about having magnetic resonance imaging (MRI) and a mammography every year.  The Pap test is a screening test for cervical cancer. A Pap test can show cell changes on the cervix that might become cervical cancer if left untreated. A Pap test is a procedure in which cells are obtained and examined from the lower end of the uterus (cervix).  Women should have a Pap test starting at age 40.  Between ages 75 and 22, Pap tests should be repeated every 2 years.  Beginning at age 83, you should have a Pap test every 3 years as long as the past 3 Pap tests have been normal.  Some women have medical problems that increase the chance of getting cervical cancer. Talk to your caregiver about these problems. It is especially important to talk to your caregiver if a new problem develops soon after your last Pap test. In these cases, your caregiver may recommend more frequent screening and Pap tests.  The above recommendations are the same for women who have or have not gotten the vaccine for human papillomavirus (HPV).  If you had a hysterectomy for a problem that was not cancer or a condition that could lead to cancer, then you no longer need Pap tests. Even if you no longer need a Pap test, a regular exam is a good idea to make sure no other problems are starting.  If you are between ages 37 and 24, and you have had normal Pap tests going back 10 years, you no longer need Pap tests. Even if you no longer need a Pap test, a regular exam is a good idea to make sure no other problems are starting.  If you have had past treatment for cervical cancer or a condition that could lead to cancer, you need Pap tests and screening for cancer for at least 20 years after your treatment.  If Pap tests have been discontinued, risk factors (such as a new sexual partner) need to be reassessed to determine if screening should be resumed.  The  HPV test is an additional test that may be used for cervical cancer screening. The HPV test looks for the virus that can cause the cell changes on the cervix. The cells collected during the Pap test can be tested for HPV. The HPV test could be used to screen women aged 17 years and older, and should be used in women of any age who have unclear Pap test results. After the age of 2, women should have HPV testing at the same frequency as a Pap test.  Colorectal cancer can be detected and often prevented. Most routine colorectal cancer screening begins at the age of 48 and continues through age 77. However, your caregiver may recommend screening at an earlier age if you have risk factors for colon cancer. On a yearly basis, your caregiver may provide home test kits to check for hidden blood in the stool. Use of a small camera at the end of a tube,  to directly examine the colon (sigmoidoscopy or colonoscopy), can detect the earliest forms of colorectal cancer. Talk to your caregiver about this at age 16, when routine screening begins. Direct examination of the colon should be repeated every 5 to 10 years through age 8, unless early forms of pre-cancerous polyps or small growths are found.  Hepatitis C blood testing is recommended for all people born from [birth year] through 1965 and any individual with known risks for hepatitis C.  Practice safe sex. Use condoms and avoid high-risk sexual practices to reduce the spread of sexually transmitted infections (STIs). STIs include gonorrhea, chlamydia, syphilis, trichomonas, herpes, HPV, and human immunodeficiency virus (HIV). Herpes, HIV, and HPV are viral illnesses that have no cure. They can result in disability, cancer, and death. Sexually active women aged 64 and younger should be checked for chlamydia. Older women with new or multiple partners should also be tested for chlamydia. Testing for other STIs is recommended if you are sexually active and at increased  risk.  Osteoporosis is a disease in which the bones lose minerals and strength with aging. This can result in serious bone fractures. The risk of osteoporosis can be identified using a bone density scan. Women ages 24 and over and women at risk for fractures or osteoporosis should discuss screening with their caregivers. Ask your caregiver whether you should take a calcium supplement or vitamin D to reduce the rate of osteoporosis.  Menopause can be associated with physical symptoms and risks. Hormone replacement therapy is available to decrease symptoms and risks. You should talk to your caregiver about whether hormone replacement therapy is right for you.  Use sunscreen with sun protection factor (SPF) of 30 or more. Apply sunscreen liberally and repeatedly throughout the day. You should seek shade when your shadow is shorter than you. Protect yourself by wearing long sleeves, pants, a wide-brimmed hat, and sunglasses year round, whenever you are outdoors.  Once a month, do a whole body skin exam, using a mirror to look at the skin on your back. Notify your caregiver of new moles, moles that have irregular borders, moles that are larger than a pencil eraser, or moles that have changed in shape or color.  Stay current with required immunizations.  Influenza. You need a dose every fall (or winter). The composition of the flu vaccine changes each year, so being vaccinated once is not enough.  Pneumococcal polysaccharide. You need 1 to 2 doses if you smoke cigarettes or if you have certain chronic medical conditions. You need 1 dose at age 39 (or older) if you have never been vaccinated.  Tetanus, diphtheria, pertussis (Tdap, Td). Get 1 dose of Tdap vaccine if you are younger than age 15, are over 25 and have contact with an infant, are a Research scientist (physical sciences), are pregnant, or simply want to be protected from whooping cough. After that, you need a Td booster dose every 10 years. Consult your caregiver if  you have not had at least 3 tetanus and diphtheria-containing shots sometime in your life or have a deep or dirty wound.  HPV. You need this vaccine if you are a woman age 9 or younger. The vaccine is given in 3 doses over 6 months.  Measles, mumps, rubella (MMR). You need at least 1 dose of MMR if you were born in 1957 or later. You may also need a second dose.  Meningococcal. If you are age 50 to 60 and a first-year college student living in a residence hall, or  have one of several medical conditions, you need to get vaccinated against meningococcal disease. You may also need additional booster doses.  Zoster (shingles). If you are age 24 or older, you should get this vaccine.  Varicella (chickenpox). If you have never had chickenpox or you were vaccinated but received only 1 dose, talk to your caregiver to find out if you need this vaccine.  Hepatitis A. You need this vaccine if you have a specific risk factor for hepatitis A virus infection or you simply wish to be protected from this disease. The vaccine is usually given as 2 doses, 6 to 18 months apart.  Hepatitis B. You need this vaccine if you have a specific risk factor for hepatitis B virus infection or you simply wish to be protected from this disease. The vaccine is given in 3 doses, usually over 6 months. Preventive Services / Frequency Ages 50 to 27  Blood pressure check.** / Every 1 to 2 years.  Lipid and cholesterol check.** / Every 5 years beginning at age 7.  Clinical breast exam.** / Every 3 years for women in their 50s and 30s.  Pap test.** / Every 2 years from ages 9 through 75. Every 3 years starting at age 31 through age 69 or 11 with a history of 3 consecutive normal Pap tests.  HPV screening.** / Every 3 years from ages 32 through ages 90 to 66 with a history of 3 consecutive normal Pap tests.  Hepatitis C blood test.** / For any individual with known risks for hepatitis C.  Skin self-exam. /  Monthly.  Influenza immunization.** / Every year.  Pneumococcal polysaccharide immunization.** / 1 to 2 doses if you smoke cigarettes or if you have certain chronic medical conditions.  Tetanus, diphtheria, pertussis (Tdap, Td) immunization. / A one-time dose of Tdap vaccine. After that, you need a Td booster dose every 10 years.  HPV immunization. / 3 doses over 6 months, if you are 4 and younger.  Measles, mumps, rubella (MMR) immunization. / You need at least 1 dose of MMR if you were born in 1957 or later. You may also need a second dose.  Meningococcal immunization. / 1 dose if you are age 6 to 32 and a first-year college student living in a residence hall, or have one of several medical conditions, you need to get vaccinated against meningococcal disease. You may also need additional booster doses.  Varicella immunization.** / Consult your caregiver.  Hepatitis A immunization.** / Consult your caregiver. 2 doses, 6 to 18 months apart.  Hepatitis B immunization.** / Consult your caregiver. 3 doses usually over 6 months. Ages 23 to 88  Blood pressure check.** / Every 1 to 2 years.  Lipid and cholesterol check.** / Every 5 years beginning at age 43.  Clinical breast exam.** / Every year after age 64.  Mammogram.** / Every year beginning at age 55 and continuing for as long as you are in good health. Consult with your caregiver.  Pap test.** / Every 3 years starting at age 51 through age 68 or 69 with a history of 3 consecutive normal Pap tests.  HPV screening.** / Every 3 years from ages 15 through ages 31 to 61 with a history of 3 consecutive normal Pap tests.  Fecal occult blood test (FOBT) of stool. / Every year beginning at age 63 and continuing until age 37. You may not need to do this test if you get a colonoscopy every 10 years.  Flexible sigmoidoscopy or  colonoscopy.** / Every 5 years for a flexible sigmoidoscopy or every 10 years for a colonoscopy beginning at age 23  and continuing until age 59.  Hepatitis C blood test.** / For all people born from [birth year] through 1965 and any individual with known risks for hepatitis C.  Skin self-exam. / Monthly.  Influenza immunization.** / Every year.  Pneumococcal polysaccharide immunization.** / 1 to 2 doses if you smoke cigarettes or if you have certain chronic medical conditions.  Tetanus, diphtheria, pertussis (Tdap, Td) immunization.** / A one-time dose of Tdap vaccine. After that, you need a Td booster dose every 10 years.  Measles, mumps, rubella (MMR) immunization. / You need at least 1 dose of MMR if you were born in 1957 or later. You may also need a second dose.  Varicella immunization.** / Consult your caregiver.  Meningococcal immunization.** / Consult your caregiver.  Hepatitis A immunization.** / Consult your caregiver. 2 doses, 6 to 18 months apart.  Hepatitis B immunization.** / Consult your caregiver. 3 doses, usually over 6 months. Ages 51 and over  Blood pressure check.** / Every 1 to 2 years.  Lipid and cholesterol check.** / Every 5 years beginning at age 41.  Clinical breast exam.** / Every year after age 40.  Mammogram.** / Every year beginning at age 4 and continuing for as long as you are in good health. Consult with your caregiver.  Pap test.** / Every 3 years starting at age 4 through age 36 or 64 with a 3 consecutive normal Pap tests. Testing can be stopped between 65 and 70 with 3 consecutive normal Pap tests and no abnormal Pap or HPV tests in the past 10 years.  HPV screening.** / Every 3 years from ages 42 through ages 49 or 63 with a history of 3 consecutive normal Pap tests. Testing can be stopped between 65 and 70 with 3 consecutive normal Pap tests and no abnormal Pap or HPV tests in the past 10 years.  Fecal occult blood test (FOBT) of stool. / Every year beginning at age 79 and continuing until age 64. You may not need to do this test if you get a colonoscopy every 10  years.  Flexible sigmoidoscopy or colonoscopy.** / Every 5 years for a flexible sigmoidoscopy or every 10 years for a colonoscopy beginning at age 30 and continuing until age 50.  Hepatitis C blood test.** / For all people born from [birth year] through 1963 and any individual with known risks for hepatitis C.  Osteoporosis screening.** / A one-time screening for women ages 4 and over and women at risk for fractures or osteoporosis.  Skin self-exam. / Monthly.  Influenza immunization.** / Every year.  Pneumococcal polysaccharide immunization.** / 1 dose at age 70 (or older) if you have never been vaccinated.  Tetanus, diphtheria, pertussis (Tdap, Td) immunization. / A one-time dose of Tdap vaccine if you are over 65 and have contact with an infant, are a Research scientist (physical sciences), or simply want to be protected from whooping cough. After that, you need a Td booster dose every 10 years.  Varicella immunization.** / Consult your caregiver.  Meningococcal immunization.** / Consult your caregiver.  Hepatitis A immunization.** / Consult your caregiver. 2 doses, 6 to 18 months apart.  Hepatitis B immunization.** / Check with your caregiver. 3 doses, usually over 6 months. ** Family history and personal history of risk and conditions may change your caregiver's recommendations. Document Released: 12/23/2001 Document Revised: 01/19/2012 Document Reviewed: 03/24/2011 [facility] Patient Information 2013 [place].     Neta Mends. Dehaven Sine M.D. Health Maintenance  Topic Date Due  . Mammogram  05/01/2003  . Pap Smear  04/01/2013  . Influenza Vaccine  07/11/2013  . Colonoscopy  11/10/2017  . Tetanus/tdap  04/01/2020   Health Maintenance Review

## 2012-12-29 NOTE — Patient Instructions (Addendum)
Get a mammogram.    No changing   medications  Weight  loss and exercise can help the low HDL and your future heath.  Will notify you  of pap  when available.   Preventive Care for Adults, Female A healthy lifestyle and preventive care can promote health and wellness. Preventive health guidelines for women include the following key practices.  A routine yearly physical is a good way to check with your caregiver about your health and preventive screening. It is a chance to share any concerns and updates on your health, and to receive a thorough exam.  Visit your dentist for a routine exam and preventive care every 6 months. Brush your teeth twice a day and floss once a day. Good oral hygiene prevents tooth decay and gum disease.  The frequency of eye exams is based on your age, health, family medical history, use of contact lenses, and other factors. Follow your caregiver's recommendations for frequency of eye exams.  Eat a healthy diet. Foods like vegetables, fruits, whole grains, low-fat dairy products, and lean protein foods contain the nutrients you need without too many calories. Decrease your intake of foods high in solid fats, added sugars, and salt. Eat the right amount of calories for you.Get information about a proper diet from your caregiver, if necessary.  Regular physical exercise is one of the most important things you can do for your health. Most adults should get at least 150 minutes of moderate-intensity exercise (any activity that increases your heart rate and causes you to sweat) each week. In addition, most adults need muscle-strengthening exercises on 2 or more days a week.  Maintain a healthy weight. The body mass index (BMI) is a screening tool to identify possible weight problems. It provides an estimate of body fat based on height and weight. Your caregiver can help determine your BMI, and can help you achieve or maintain a healthy weight.For adults 20 years and  older:  A BMI below 18.5 is considered underweight.  A BMI of 18.5 to 24.9 is normal.  A BMI of 25 to 29.9 is considered overweight.  A BMI of 30 and above is considered obese.  Maintain normal blood lipids and cholesterol levels by exercising and minimizing your intake of saturated fat. Eat a balanced diet with plenty of fruit and vegetables. Blood tests for lipids and cholesterol should begin at age 85 and be repeated every 5 years. If your lipid or cholesterol levels are high, you are over 50, or you are at high risk for heart disease, you may need your cholesterol levels checked more frequently.Ongoing high lipid and cholesterol levels should be treated with medicines if diet and exercise are not effective.  If you smoke, find out from your caregiver how to quit. If you do not use tobacco, do not start.  If you are pregnant, do not drink alcohol. If you are breastfeeding, be very cautious about drinking alcohol. If you are not pregnant and choose to drink alcohol, do not exceed 1 drink per day. One drink is considered to be 12 ounces (355 mL) of beer, 5 ounces (148 mL) of wine, or 1.5 ounces (44 mL) of liquor.  Avoid use of street drugs. Do not share needles with anyone. Ask for help if you need support or instructions about stopping the use of drugs.  High blood pressure causes heart disease and increases the risk of stroke. Your blood pressure should be checked at least every 1 to 2 years. Ongoing  high blood pressure should be treated with medicines if weight loss and exercise are not effective.  If you are 70 to 60 years old, ask your caregiver if you should take aspirin to prevent strokes.  Diabetes screening involves taking a blood sample to check your fasting blood sugar level. This should be done once every 3 years, after age 48, if you are within normal weight and without risk factors for diabetes. Testing should be considered at a younger age or be carried out more frequently if  you are overweight and have at least 1 risk factor for diabetes.  Breast cancer screening is essential preventive care for women. You should practice "breast self-awareness." This means understanding the normal appearance and feel of your breasts and may include breast self-examination. Any changes detected, no matter how small, should be reported to a caregiver. Women in their 49s and 30s should have a clinical breast exam (CBE) by a caregiver as part of a regular health exam every 1 to 3 years. After age 42, women should have a CBE every year. Starting at age 67, women should consider having a mammography (breast X-ray test) every year. Women who have a family history of breast cancer should talk to their caregiver about genetic screening. Women at a high risk of breast cancer should talk to their caregivers about having magnetic resonance imaging (MRI) and a mammography every year.  The Pap test is a screening test for cervical cancer. A Pap test can show cell changes on the cervix that might become cervical cancer if left untreated. A Pap test is a procedure in which cells are obtained and examined from the lower end of the uterus (cervix).  Women should have a Pap test starting at age 51.  Between ages 29 and 63, Pap tests should be repeated every 2 years.  Beginning at age 33, you should have a Pap test every 3 years as long as the past 3 Pap tests have been normal.  Some women have medical problems that increase the chance of getting cervical cancer. Talk to your caregiver about these problems. It is especially important to talk to your caregiver if a new problem develops soon after your last Pap test. In these cases, your caregiver may recommend more frequent screening and Pap tests.  The above recommendations are the same for women who have or have not gotten the vaccine for human papillomavirus (HPV).  If you had a hysterectomy for a problem that was not cancer or a condition that could  lead to cancer, then you no longer need Pap tests. Even if you no longer need a Pap test, a regular exam is a good idea to make sure no other problems are starting.  If you are between ages 10 and 85, and you have had normal Pap tests going back 10 years, you no longer need Pap tests. Even if you no longer need a Pap test, a regular exam is a good idea to make sure no other problems are starting.  If you have had past treatment for cervical cancer or a condition that could lead to cancer, you need Pap tests and screening for cancer for at least 20 years after your treatment.  If Pap tests have been discontinued, risk factors (such as a new sexual partner) need to be reassessed to determine if screening should be resumed.  The HPV test is an additional test that may be used for cervical cancer screening. The HPV test looks for the  virus that can cause the cell changes on the cervix. The cells collected during the Pap test can be tested for HPV. The HPV test could be used to screen women aged 47 years and older, and should be used in women of any age who have unclear Pap test results. After the age of 30, women should have HPV testing at the same frequency as a Pap test.  Colorectal cancer can be detected and often prevented. Most routine colorectal cancer screening begins at the age of 31 and continues through age 29. However, your caregiver may recommend screening at an earlier age if you have risk factors for colon cancer. On a yearly basis, your caregiver may provide home test kits to check for hidden blood in the stool. Use of a small camera at the end of a tube, to directly examine the colon (sigmoidoscopy or colonoscopy), can detect the earliest forms of colorectal cancer. Talk to your caregiver about this at age 3, when routine screening begins. Direct examination of the colon should be repeated every 5 to 10 years through age 7, unless early forms of pre-cancerous polyps or small growths are  found.  Hepatitis C blood testing is recommended for all people born from [birth year] through 1965 and any individual with known risks for hepatitis C.  Practice safe sex. Use condoms and avoid high-risk sexual practices to reduce the spread of sexually transmitted infections (STIs). STIs include gonorrhea, chlamydia, syphilis, trichomonas, herpes, HPV, and human immunodeficiency virus (HIV). Herpes, HIV, and HPV are viral illnesses that have no cure. They can result in disability, cancer, and death. Sexually active women aged 54 and younger should be checked for chlamydia. Older women with new or multiple partners should also be tested for chlamydia. Testing for other STIs is recommended if you are sexually active and at increased risk.  Osteoporosis is a disease in which the bones lose minerals and strength with aging. This can result in serious bone fractures. The risk of osteoporosis can be identified using a bone density scan. Women ages 57 and over and women at risk for fractures or osteoporosis should discuss screening with their caregivers. Ask your caregiver whether you should take a calcium supplement or vitamin D to reduce the rate of osteoporosis.  Menopause can be associated with physical symptoms and risks. Hormone replacement therapy is available to decrease symptoms and risks. You should talk to your caregiver about whether hormone replacement therapy is right for you.  Use sunscreen with sun protection factor (SPF) of 30 or more. Apply sunscreen liberally and repeatedly throughout the day. You should seek shade when your shadow is shorter than you. Protect yourself by wearing long sleeves, pants, a wide-brimmed hat, and sunglasses year round, whenever you are outdoors.  Once a month, do a whole body skin exam, using a mirror to look at the skin on your back. Notify your caregiver of new moles, moles that have irregular borders, moles that are larger than a pencil eraser, or moles that have  changed in shape or color.  Stay current with required immunizations.  Influenza. You need a dose every fall (or winter). The composition of the flu vaccine changes each year, so being vaccinated once is not enough.  Pneumococcal polysaccharide. You need 1 to 2 doses if you smoke cigarettes or if you have certain chronic medical conditions. You need 1 dose at age 69 (or older) if you have never been vaccinated.  Tetanus, diphtheria, pertussis (Tdap, Td). Get 1 dose of Tdap  vaccine if you are younger than age 6, are over 18 and have contact with an infant, are a Research scientist (physical sciences), are pregnant, or simply want to be protected from whooping cough. After that, you need a Td booster dose every 10 years. Consult your caregiver if you have not had at least 3 tetanus and diphtheria-containing shots sometime in your life or have a deep or dirty wound.  HPV. You need this vaccine if you are a woman age 51 or younger. The vaccine is given in 3 doses over 6 months.  Measles, mumps, rubella (MMR). You need at least 1 dose of MMR if you were born in 1957 or later. You may also need a second dose.  Meningococcal. If you are age 48 to 88 and a first-year college student living in a residence hall, or have one of several medical conditions, you need to get vaccinated against meningococcal disease. You may also need additional booster doses.  Zoster (shingles). If you are age 1 or older, you should get this vaccine.  Varicella (chickenpox). If you have never had chickenpox or you were vaccinated but received only 1 dose, talk to your caregiver to find out if you need this vaccine.  Hepatitis A. You need this vaccine if you have a specific risk factor for hepatitis A virus infection or you simply wish to be protected from this disease. The vaccine is usually given as 2 doses, 6 to 18 months apart.  Hepatitis B. You need this vaccine if you have a specific risk factor for hepatitis B virus infection or you  simply wish to be protected from this disease. The vaccine is given in 3 doses, usually over 6 months. Preventive Services / Frequency Ages 22 to 22  Blood pressure check.** / Every 1 to 2 years.  Lipid and cholesterol check.** / Every 5 years beginning at age 35.  Clinical breast exam.** / Every 3 years for women in their 44s and 30s.  Pap test.** / Every 2 years from ages 55 through 26. Every 3 years starting at age 44 through age 68 or 32 with a history of 3 consecutive normal Pap tests.  HPV screening.** / Every 3 years from ages 68 through ages 4 to 42 with a history of 3 consecutive normal Pap tests.  Hepatitis C blood test.** / For any individual with known risks for hepatitis C.  Skin self-exam. / Monthly.  Influenza immunization.** / Every year.  Pneumococcal polysaccharide immunization.** / 1 to 2 doses if you smoke cigarettes or if you have certain chronic medical conditions.  Tetanus, diphtheria, pertussis (Tdap, Td) immunization. / A one-time dose of Tdap vaccine. After that, you need a Td booster dose every 10 years.  HPV immunization. / 3 doses over 6 months, if you are 39 and younger.  Measles, mumps, rubella (MMR) immunization. / You need at least 1 dose of MMR if you were born in 1957 or later. You may also need a second dose.  Meningococcal immunization. / 1 dose if you are age 62 to 42 and a first-year college student living in a residence hall, or have one of several medical conditions, you need to get vaccinated against meningococcal disease. You may also need additional booster doses.  Varicella immunization.** / Consult your caregiver.  Hepatitis A immunization.** / Consult your caregiver. 2 doses, 6 to 18 months apart.  Hepatitis B immunization.** / Consult your caregiver. 3 doses usually over 6 months. Ages 72 to 67  Blood pressure check.** /  Every 1 to 2 years.  Lipid and cholesterol check.** / Every 5 years beginning at age 22.  Clinical breast  exam.** / Every year after age 29.  Mammogram.** / Every year beginning at age 58 and continuing for as long as you are in good health. Consult with your caregiver.  Pap test.** / Every 3 years starting at age 60 through age 41 or 61 with a history of 3 consecutive normal Pap tests.  HPV screening.** / Every 3 years from ages 47 through ages 18 to 37 with a history of 3 consecutive normal Pap tests.  Fecal occult blood test (FOBT) of stool. / Every year beginning at age 29 and continuing until age 8. You may not need to do this test if you get a colonoscopy every 10 years.  Flexible sigmoidoscopy or colonoscopy.** / Every 5 years for a flexible sigmoidoscopy or every 10 years for a colonoscopy beginning at age 29 and continuing until age 109.  Hepatitis C blood test.** / For all people born from [birth year] through 1963 and any individual with known risks for hepatitis C.  Skin self-exam. / Monthly.  Influenza immunization.** / Every year.  Pneumococcal polysaccharide immunization.** / 1 to 2 doses if you smoke cigarettes or if you have certain chronic medical conditions.  Tetanus, diphtheria, pertussis (Tdap, Td) immunization.** / A one-time dose of Tdap vaccine. After that, you need a Td booster dose every 10 years.  Measles, mumps, rubella (MMR) immunization. / You need at least 1 dose of MMR if you were born in 1957 or later. You may also need a second dose.  Varicella immunization.** / Consult your caregiver.  Meningococcal immunization.** / Consult your caregiver.  Hepatitis A immunization.** / Consult your caregiver. 2 doses, 6 to 18 months apart.  Hepatitis B immunization.** / Consult your caregiver. 3 doses, usually over 6 months. Ages 68 and over  Blood pressure check.** / Every 1 to 2 years.  Lipid and cholesterol check.** / Every 5 years beginning at age 73.  Clinical breast exam.** / Every year after age 79.  Mammogram.** / Every year beginning at age 66 and continuing  for as long as you are in good health. Consult with your caregiver.  Pap test.** / Every 3 years starting at age 41 through age 29 or 71 with a 3 consecutive normal Pap tests. Testing can be stopped between 65 and 70 with 3 consecutive normal Pap tests and no abnormal Pap or HPV tests in the past 10 years.  HPV screening.** / Every 3 years from ages 72 through ages 80 or 67 with a history of 3 consecutive normal Pap tests. Testing can be stopped between 65 and 70 with 3 consecutive normal Pap tests and no abnormal Pap or HPV tests in the past 10 years.  Fecal occult blood test (FOBT) of stool. / Every year beginning at age 39 and continuing until age 7. You may not need to do this test if you get a colonoscopy every 10 years.  Flexible sigmoidoscopy or colonoscopy.** / Every 5 years for a flexible sigmoidoscopy or every 10 years for a colonoscopy beginning at age 5 and continuing until age 42.  Hepatitis C blood test.** / For all people born from [birth year] through 1963 and any individual with known risks for hepatitis C.  Osteoporosis screening.** / A one-time screening for women ages 102 and over and women at risk for fractures or osteoporosis.  Skin self-exam. / Monthly.  Influenza immunization.** / Every  year.  Pneumococcal polysaccharide immunization.** / 1 dose at age 33 (or older) if you have never been vaccinated.  Tetanus, diphtheria, pertussis (Tdap, Td) immunization. / A one-time dose of Tdap vaccine if you are over 65 and have contact with an infant, are a Research scientist (physical sciences), or simply want to be protected from whooping cough. After that, you need a Td booster dose every 10 years.  Varicella immunization.** / Consult your caregiver.  Meningococcal immunization.** / Consult your caregiver.  Hepatitis A immunization.** / Consult your caregiver. 2 doses, 6 to 18 months apart.  Hepatitis B immunization.** / Check with your caregiver. 3 doses, usually over 6 months. ** Family history  and personal history of risk and conditions may change your caregiver's recommendations. Document Released: 12/23/2001 Document Revised: 01/19/2012 Document Reviewed: 03/24/2011 [facility] Patient Information 2013 [place], [place].

## 2013-01-03 ENCOUNTER — Telehealth: Payer: Self-pay | Admitting: Cardiovascular Disease

## 2013-01-03 NOTE — Telephone Encounter (Signed)
I spoke with pt and she is having the same symptoms as last year (myocarditis).  The pt is complaining of a burning sensation in her chest.  The pt has restarted her Celebrex at this time and I have scheduled her to come into the office on 01/06/13 for further evaluation with Tereso Newcomer PA-C. The pt did speak with her PCP about her symptoms during 12/29/12 appointment.

## 2013-01-03 NOTE — Telephone Encounter (Signed)
Pt having burning under left breast, had same thing last year and was given celebrex, so started taking that this time and felt better, but stopped it when felt better because it cause her stomach problems, now having same symptoms and wants to see if just needs to keep taking the celebrex or does she needs to see cooper?

## 2013-01-06 ENCOUNTER — Ambulatory Visit (INDEPENDENT_AMBULATORY_CARE_PROVIDER_SITE_OTHER): Payer: No Typology Code available for payment source | Admitting: Physician Assistant

## 2013-01-06 ENCOUNTER — Encounter: Payer: Self-pay | Admitting: Physician Assistant

## 2013-01-06 VITALS — BP 138/88 | HR 70 | Ht 64.0 in | Wt 242.1 lb

## 2013-01-06 MED ORDER — CELECOXIB 200 MG PO CAPS: 200.0000 mg | ORAL_CAPSULE | Freq: Every day | ORAL | Status: DC

## 2013-01-06 MED ORDER — CELECOXIB 200 MG PO CAPS: ORAL_CAPSULE | ORAL | Status: DC

## 2013-01-06 MED ORDER — COLCHICINE 0.6 MG PO TABS: 0.6000 mg | ORAL_TABLET | Freq: Every day | ORAL | Status: DC

## 2013-01-06 NOTE — Patient Instructions (Addendum)
Your physician has recommended you make the following change in your medication:   1. Take Celebrex 200 mg by mouth daily  2-4 weeks. 2. Colchicine 0.6 mg by mouth daily for 3 months.  Your physician has requested that you have an echocardiogram. Echocardiography is a painless test that uses sound waves to create images of your heart. It provides your doctor with information about the size and shape of your heart and how well your heart's chambers and valves are working. This procedure takes approximately one hour. There are no restrictions for this procedure.  Your physician recommends that you schedule a follow-up appointment in:  2 weeks with Tereso Newcomer [facility], same day Dr. Excell Seltzer is in the office.

## 2013-01-06 NOTE — Progress Notes (Signed)
Quick Note:  Tell patient PAP is normal. Neg hpv ______

## 2013-01-06 NOTE — Progress Notes (Signed)
[address] [place], [place]  [address] Phone: [phone], Fax:  [phone]  Date:  01/06/2013   ID:  Shelby Wong, DOB [birth year], MRN [id number]  PCP:  Lorretta Harp, MD  Primary Cardiologist:  Dr. Tonny Bollman     History of Present Illness: Shelby Wong is a 62 y.o. female who returns for the evaluation of chest pain.  Patient was admitted 12/2011 with chest pain and elevated enzymes c/w NSTEMI.  LHC 12/15/11: mild LAD plaque, EF 55%. Chest CT 12/16/11: no pulmo embolus, mild CM, enlarged main pulmonary artery concerning for pulmo arterial HTN. Echo 12/16/11: mild LVH, EF 55-60%, PASP 46. Abd u/s: fatty liver, s/p cholecystectomy, no focal lesions. Etiology for presenting symptoms were not clear and it was questioned if this was a picture of myopericarditis without typical EKG changes.  She was admitted again 01/2012 with another NSTEMI.  LHC 01/16/12 demonstrated normal coronary arteries, EF 60%, normal right heart pressures. Cardiac MRI demonstrated punctate hyper-enhancement in mid epicardium c/w focal myocarditis.  Last seen by Dr. Excell Seltzer.  Colchicine was stopped at that time.  Over the last 2 mos, she has noted recurrent left chest pain described as burning.  No pleuritic symptoms. It is more noticeable when she is active.  She does note increased pain with lying supine.  This is better with sitting up.  No dyspnea.  No orthopnea, PND, edema.  She started back on Celebrex with some improvement.  She stopped this and her symptoms worsened again. She feels a little bit like she did when she had myocarditis but not as bad.  She notes a recent bout of sinusitis.  Labs (2/14):  K 3.9, creatinine 0.8, ALT 10, LDL 104, Hgb 12, TSH 2.51  Wt Readings from Last 3 Encounters:  12/29/12 239 lb (108.41 kg)  07/09/12 242 lb (109.77 kg)  06/11/12 241 lb (109.317 kg)     Past Medical History  Diagnosis Date  . Allergic rhinitis   . Depression   . GERD (gastroesophageal reflux  disease)   . Hiatal hernia   . Diverticulosis of colon (without mention of hemorrhage)   . Hypothyroidism   . IBS (irritable bowel syndrome)   . Anxiety   . Hemorrhoids   . Obesity   . Transaminitis     Abd [place] 12/2011 - hepatic steatosis  . Pulmonary HTN     Enlarged pulmonary artery by CT angio 12/2011 - PAP by echo 12/2011  . Myocarditis     a. NSTEMI:  12/2011 with peak troponin 2. Cath - mild nonobstructive plaque.;  b. nstemi 01/2012, relook cath - nonobs - MRI sugg of myocarditis  . Elevated LFTs   . Shingles 07/09/2012  . POLYP, ANAL AND RECTAL 09/07/2007    Qualifier: Diagnosis of  By: Lawernce Ion, CMA (AAMA), Bethann Berkshire     Current Outpatient Prescriptions  Medication Sig Dispense Refill  . celecoxib (CELEBREX) 50 MG capsule Take 50 mg by mouth daily.      . Cholecalciferol (VITAMIN D) 400 UNITS capsule Take 400 Units by mouth daily.        Marland Kitchen diltiazem (CARDIZEM CD) 120 MG 24 hr capsule TAKE 1 CAPSULE (120 MG TOTAL) BY MOUTH DAILY.  30 capsule  6  . FLUoxetine (PROZAC) 10 MG capsule TAKE ONE CAPSULE BY MOUTH EVERY DAY  30 capsule  5  . levothyroxine (SYNTHROID, LEVOTHROID) 100 MCG tablet TAKE 1 TABLET BY MOUTH EVERY DAY  30 tablet  3  .  lisinopril (PRINIVIL,ZESTRIL) 10 MG tablet TAKE 1 TABLET (10 MG TOTAL) BY MOUTH DAILY.  30 tablet  6  . Loratadine (CLARITIN) 10 MG CAPS Take 10 mg by mouth daily as needed. As needed for sinus      . pantoprazole (PROTONIX) 40 MG tablet Take 1 tablet (40 mg total) by mouth daily.  30 tablet  10   No current facility-administered medications for this visit.    Allergies:   No Known Allergies  Social History:  The patient  reports that she quit smoking about 6 years ago. She has never used smokeless tobacco. She reports that  drinks alcohol. She reports that she does not use illicit drugs.   ROS:  Please see the history of present illness.    All other systems reviewed and negative.   PHYSICAL EXAM: VS:  BP 138/88  Pulse 70  Ht 5\' 4"   (1.626 m)  Wt 242 lb 1.9 oz (109.825 kg)  BMI 41.54 kg/m2 Well nourished, well developed, in no acute distress HEENT: normal Neck: no JVD Cardiac:  normal S1, S2; RRR; 1/6 systolic murmur (? Rub) along LSB Lungs:  clear to auscultation bilaterally, no wheezing, rhonchi or rales Abd: soft, nontender, no hepatomegaly Ext: trace bilateral ankle edema Skin: warm and dry Neuro:  CNs 2-12 intact, no focal abnormalities noted  EKG:  NSR,HR 70, normal axis, no ST changes     ASSESSMENT AND PLAN:  1. Chest Pain:  Symptoms suspicious for recurrent myopericarditis.  She had a recent sinusitis and notes problems with sinusitis last year prior to presenting with chest pain.  Symptoms are improved on Celebrex.  She is tolerating this ok. I have asked her to remain on Celebrex 200 mg QD for 2-4 weeks.  Will also restart Colchicine 0.6 mg QD and continue for 3 mos.  Check Echocardiogram to rule out effusion.  Plan close follow up. 2. Hypertension:  Fair control. 3. Disposition:  Follow up with Dr. Excell Seltzer or me in 2 weeks.   Signed, Tereso Newcomer, PA-C  2:40 PM 01/06/2013

## 2013-01-07 ENCOUNTER — Encounter: Payer: Self-pay | Admitting: Family Medicine

## 2013-01-13 ENCOUNTER — Ambulatory Visit (HOSPITAL_COMMUNITY): Payer: No Typology Code available for payment source | Attending: Cardiology

## 2013-01-13 DIAGNOSIS — I514 Myocarditis, unspecified: Secondary | ICD-10-CM | POA: Insufficient documentation

## 2013-01-13 DIAGNOSIS — R072 Precordial pain: Secondary | ICD-10-CM

## 2013-01-13 DIAGNOSIS — R079 Chest pain, unspecified: Secondary | ICD-10-CM | POA: Insufficient documentation

## 2013-01-13 NOTE — Progress Notes (Signed)
Echocardiogram performed.  

## 2013-01-14 ENCOUNTER — Telehealth: Payer: Self-pay | Admitting: *Deleted

## 2013-01-14 ENCOUNTER — Encounter: Payer: Self-pay | Admitting: Physician Assistant

## 2013-01-14 NOTE — Telephone Encounter (Signed)
Message copied by Tarri Fuller on Fri Jan 14, 2013 11:55 AM ------      Message from: [place], [place] T      Created: Fri Jan 14, 2013 10:51 AM       Echo ok with      Normal LV function.      Tereso Newcomer, PA-C 01/14/2013 10:51 AM ------

## 2013-01-14 NOTE — Telephone Encounter (Signed)
pt notified about echo results with verbal understanding 

## 2013-01-16 ENCOUNTER — Other Ambulatory Visit: Payer: Self-pay | Admitting: Gastroenterology

## 2013-01-20 ENCOUNTER — Ambulatory Visit (INDEPENDENT_AMBULATORY_CARE_PROVIDER_SITE_OTHER): Payer: No Typology Code available for payment source | Admitting: Physician Assistant

## 2013-01-20 ENCOUNTER — Encounter: Payer: Self-pay | Admitting: Physician Assistant

## 2013-01-20 VITALS — BP 148/90 | HR 72 | Ht 64.0 in | Wt 241.4 lb

## 2013-01-20 DIAGNOSIS — I10 Essential (primary) hypertension: Secondary | ICD-10-CM

## 2013-01-20 DIAGNOSIS — I514 Myocarditis, unspecified: Secondary | ICD-10-CM

## 2013-01-20 MED ORDER — COLCHICINE 0.6 MG PO TABS: 0.6000 mg | ORAL_TABLET | Freq: Every day | ORAL | Status: DC

## 2013-01-20 NOTE — Progress Notes (Signed)
[address] [place], [place]  [address] Phone: [phone], Fax:  [phone]  Date:  01/20/2013   ID:  Shelby Wong, DOB 1951-04-29, MRN [id number]  PCP:  Lorretta Harp, MD  Primary Cardiologist:  Dr. Tonny Bollman     History of Present Illness: Shelby Wong is a 62 y.o. female who returns for f/u on chest pain.  Patient was admitted in 12/2011 with chest pain and elevated enzymes c/w NSTEMI.  LHC 12/15/11: mild LAD plaque, EF 55%. Chest CT 12/16/11: no pulmo embolus, mild CM, enlarged main pulmonary artery concerning for pulmo arterial HTN. Echo 12/16/11: mild LVH, EF 55-60%, PASP 46. Abd u/s: fatty liver, s/p cholecystectomy, no focal lesions. Etiology for presenting symptoms were not clear and it was questioned if this was a picture of myopericarditis without typical EKG changes.  She was admitted again 01/2012 with another NSTEMI.  LHC 01/16/12 demonstrated normal coronary arteries, EF 60%, normal right heart pressures. Cardiac MRI demonstrated punctate hyper-enhancement in mid epicardium c/w focal myocarditis.    She had done well until the last couple mos when she developed recurrent left chest pain.  Symptoms were suspicious for recurrent myopericarditis.  I had her remain on Celebrex for a total of 2 weeks and restarted colchicine.  Echo was done and demonstrated normal EF and normal pericardium.  She is doing much better.  CP almost resolved.  No dyspnea, syncope, orthopnea, PND, edema.  Labs (2/14):  K 3.9, creatinine 0.8, ALT 10, LDL 104, Hgb 12, TSH 2.51  Wt Readings from Last 3 Encounters:  01/06/13 242 lb 1.9 oz (109.825 kg)  12/29/12 239 lb (108.41 kg)  07/09/12 242 lb (109.77 kg)     Past Medical History  Diagnosis Date  . Allergic rhinitis   . Depression   . GERD (gastroesophageal reflux disease)   . Hiatal hernia   . Diverticulosis of colon (without mention of hemorrhage)   . Hypothyroidism   . IBS (irritable bowel syndrome)   . Anxiety   .  Hemorrhoids   . Obesity   . Transaminitis     Abd [place] 12/2011 - hepatic steatosis  . Pulmonary HTN     Enlarged pulmonary artery by CT angio 12/2011 - PAP by echo 12/2011  . Myocarditis     a. NSTEMI:  12/2011 with peak troponin 2. Cath - mild nonobstructive plaque.;  b. nstemi 01/2012, relook cath - nonobs - MRI sugg of myocarditis  . Elevated LFTs   . Shingles 07/09/2012  . POLYP, ANAL AND RECTAL 09/07/2007    Qualifier: Diagnosis of  By: Lawernce Ion, CMA (AAMA), Bethann Berkshire   . Hx of echocardiogram     a. Echo 3/14:  Mild LVH, EF 55-65%, normal wall motion, PASP 33    Current Outpatient Prescriptions  Medication Sig Dispense Refill  . celecoxib (CELEBREX) 200 MG capsule 1 tablet by mouth daily for 2-4 weeks.  30 capsule  0  . celecoxib (CELEBREX) 200 MG capsule Take 1 capsule (200 mg total) by mouth daily. Take one capsule by mouth daily for 2-4 weeks.  30 capsule  1  . Cholecalciferol (VITAMIN D) 400 UNITS capsule Take 400 Units by mouth daily.        . colchicine 0.6 MG tablet Take 1 tablet (0.6 mg total) by mouth daily.  30 tablet  2  . diltiazem (CARDIZEM CD) 120 MG 24 hr capsule TAKE 1 CAPSULE (120 MG TOTAL) BY MOUTH DAILY.  30 capsule  6  .  FLUoxetine (PROZAC) 10 MG capsule TAKE ONE CAPSULE BY MOUTH EVERY DAY  30 capsule  5  . levothyroxine (SYNTHROID, LEVOTHROID) 100 MCG tablet TAKE 1 TABLET BY MOUTH EVERY DAY  30 tablet  3  . lisinopril (PRINIVIL,ZESTRIL) 10 MG tablet TAKE 1 TABLET (10 MG TOTAL) BY MOUTH DAILY.  30 tablet  6  . Loratadine (CLARITIN) 10 MG CAPS Take 10 mg by mouth daily as needed. As needed for sinus      . pantoprazole (PROTONIX) 40 MG tablet TAKE 1 TABLET (40 MG TOTAL) BY MOUTH DAILY.  30 tablet  1   No current facility-administered medications for this visit.    Allergies:   No Known Allergies  Social History:  The patient  reports that she quit smoking about 6 years ago. She has never used smokeless tobacco. She reports that  drinks alcohol. She reports  that she does not use illicit drugs.   ROS:  Please see the history of present illness.    All other systems reviewed and negative.   PHYSICAL EXAM: VS:  BP 148/90  Pulse 72  Ht 5\' 4"  (1.626 m)  Wt 241 lb 6.4 oz (109.498 kg)  BMI 41.42 kg/m2 Well nourished, well developed, in no acute distress HEENT: normal Neck: no JVD Cardiac:  normal S1, S2; RRR; no rub Lungs:  clear to auscultation bilaterally, no wheezing, rhonchi or rales Abd: soft, nontender, no hepatomegaly Ext: trace bilateral ankle edema Skin: warm and dry Neuro:  CNs 2-12 intact, no focal abnormalities noted  EKG:  NSR, HR 72, normal axis, no acute changes     ASSESSMENT AND PLAN:  1. Myopericarditis:  Improved.  She can stop Celebrex.  Continue Colchicine for total of 3 mos.   2. Hypertension:  Fair control.  Will likely get better with stopping Celebrex.  Monitor.  3. Disposition:  Follow up with Dr. Excell Seltzer 2 mos.   Signed, Tereso Newcomer, PA-C  2:17 PM 01/20/2013

## 2013-01-20 NOTE — Patient Instructions (Addendum)
NO CHANGES WITH MEDICATIONS  MAKE SURE TO STAY ON THE COLCHICINE FOR A FULL 3 MONTHS THEN STOP  PLEASE FOLLOW UP WITH DR. Excell Seltzer 03/11/13 @ 1:45

## 2013-01-23 ENCOUNTER — Other Ambulatory Visit: Payer: Self-pay | Admitting: Internal Medicine

## 2013-01-24 ENCOUNTER — Other Ambulatory Visit: Payer: Self-pay | Admitting: Family Medicine

## 2013-03-11 ENCOUNTER — Ambulatory Visit: Payer: No Typology Code available for payment source | Admitting: Cardiovascular Disease

## 2013-03-13 ENCOUNTER — Other Ambulatory Visit: Payer: Self-pay | Admitting: Gastroenterology

## 2013-03-13 ENCOUNTER — Other Ambulatory Visit (HOSPITAL_COMMUNITY): Payer: Self-pay | Admitting: Cardiovascular Disease

## 2013-04-11 ENCOUNTER — Other Ambulatory Visit: Payer: Self-pay | Admitting: Cardiovascular Disease

## 2013-05-15 ENCOUNTER — Other Ambulatory Visit: Payer: Self-pay | Admitting: Gastroenterology

## 2013-05-16 NOTE — Telephone Encounter (Signed)
Patient will need an office visit for further refills  

## 2013-05-29 ENCOUNTER — Other Ambulatory Visit: Payer: Self-pay | Admitting: Internal Medicine

## 2013-06-12 ENCOUNTER — Other Ambulatory Visit: Payer: Self-pay | Admitting: Gastroenterology

## 2013-06-18 ENCOUNTER — Other Ambulatory Visit: Payer: Self-pay | Admitting: Gastroenterology

## 2013-06-22 ENCOUNTER — Other Ambulatory Visit: Payer: Self-pay | Admitting: Gastroenterology

## 2013-06-29 ENCOUNTER — Encounter: Payer: Self-pay | Admitting: Internal Medicine

## 2013-06-29 ENCOUNTER — Ambulatory Visit (INDEPENDENT_AMBULATORY_CARE_PROVIDER_SITE_OTHER): Payer: BC Managed Care – PPO | Admitting: Internal Medicine

## 2013-06-29 VITALS — BP 126/82 | HR 76 | Temp 98.2°F | Wt 245.0 lb

## 2013-06-29 DIAGNOSIS — I119 Hypertensive heart disease without heart failure: Secondary | ICD-10-CM

## 2013-06-29 DIAGNOSIS — K449 Diaphragmatic hernia without obstruction or gangrene: Secondary | ICD-10-CM

## 2013-06-29 DIAGNOSIS — E039 Hypothyroidism, unspecified: Secondary | ICD-10-CM

## 2013-06-29 DIAGNOSIS — F329 Major depressive disorder, single episode, unspecified: Secondary | ICD-10-CM

## 2013-06-29 DIAGNOSIS — F3289 Other specified depressive episodes: Secondary | ICD-10-CM

## 2013-06-29 DIAGNOSIS — K219 Gastro-esophageal reflux disease without esophagitis: Secondary | ICD-10-CM

## 2013-06-29 MED ORDER — LEVOTHYROXINE SODIUM 100 MCG PO TABS: ORAL_TABLET | ORAL | Status: DC

## 2013-06-29 MED ORDER — FLUOXETINE HCL 10 MG PO CAPS: ORAL_CAPSULE | ORAL | Status: DC

## 2013-06-29 MED ORDER — PANTOPRAZOLE SODIUM 40 MG PO TBEC: DELAYED_RELEASE_TABLET | ORAL | Status: DC

## 2013-06-29 NOTE — Progress Notes (Signed)
Chief Complaint  Patient presents with  . Follow-up    Multiple issues    HPI: Patient comes in today for follow up of  multiple medical problems.   Mammogram due e still hasn't gotten it been very busy.  Eye exam due.   Blood pressure has been good control to no more chest pain since last episode probe colchicine. We'll need blood pressure medicine refilled soon has been done by cardiology. She is on the lisinopril and Cardizem. Cost of the echocardiogram was $1100 because of her high deductible. Fortunately was normal  GI Dr. Jarold Motto had her on proton asked her chest pain which helped very well with radial hernia however ran out and switched over the counter Nexium after some this days and not as good asks if we can continue the Protonix.  She realizes if she loses weight she'll feel a lot better in this area  Is due for thyroid refill no change in medication.  On fluoxetine low dose 10 mg used to be doing the job sometimes is down but is just very busy at this time. No hopelessness. ROS: See pertinent positives and negatives per HPI. Education Occupational hygienist. Busy work  Past Medical History  Diagnosis Date  . Allergic rhinitis   . Depression   . GERD (gastroesophageal reflux disease)   . Hiatal hernia   . Diverticulosis of colon (without mention of hemorrhage)   . Hypothyroidism   . IBS (irritable bowel syndrome)   . Anxiety   . Hemorrhoids   . Obesity   . Transaminitis     Abd [place] 12/2011 - hepatic steatosis  . Pulmonary HTN     Enlarged pulmonary artery by CT angio 12/2011 - PAP by echo 12/2011  . Myocarditis     a. NSTEMI:  12/2011 with peak troponin 2. Cath - mild nonobstructive plaque.;  b. nstemi 01/2012, relook cath - nonobs - MRI sugg of myocarditis  . Elevated LFTs   . Shingles 07/09/2012  . POLYP, ANAL AND RECTAL 09/07/2007    Qualifier: Diagnosis of  By: Lawernce Ion, CMA (AAMA), Bethann Berkshire   . Hx of echocardiogram     a. Echo 3/14:  Mild LVH, EF  55-65%, normal wall motion, PASP 33    Family History  Problem Relation Age of Onset  . Arthritis Mother     osteo  . Breast cancer Mother   . Coronary artery disease Mother   . Other Mother     hearing loss  . Diabetes Brother     50 s  . Thyroid disease Mother   . Colon cancer Neg Hx     History   Social History  . Marital Status: Divorced    Spouse Name: N/A    Number of Children: 1  . Years of Education: N/A   Occupational History  . Direct Sales Rep    Social History Main Topics  . Smoking status: Former Smoker    Quit date: 09/11/2006  . Smokeless tobacco: Never Used  . Alcohol Use: Yes     Comment: occassional  . Drug Use: No  . Sexual Activity: Not Currently    Birth Control/ Protection: Post-menopausal   Other Topics Concern  . 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 Cholecalciferol (VITAMIN D) 400 UNITS capsule Take 400 Units by mouth daily.        Marland Kitchen diltiazem (CARDIZEM CD) 120 MG 24 hr capsule TAKE 1 CAPSULE (120 MG TOTAL) BY MOUTH DAILY.  30 capsule  3  . FLUoxetine (PROZAC) 10 MG capsule TAKE ONE CAPSULE BY MOUTH EVERY DAY  30 capsule  11  . levothyroxine (SYNTHROID, LEVOTHROID) 100 MCG tablet TAKE 1 TABLET BY MOUTH EVERY DAY  30 tablet  5  . lisinopril (PRINIVIL,ZESTRIL) 10 MG tablet TAKE 1 TABLET (10 MG TOTAL) BY MOUTH DAILY.  30 tablet  6  . Loratadine (CLARITIN) 10 MG CAPS Take 10 mg by mouth daily as needed. As needed for sinus      . pantoprazole (PROTONIX) 40 MG tablet TAKE 1 TABLET (40 MG TOTAL) BY MOUTH DAILY.  30 tablet  11  . [DISCONTINUED] FLUoxetine (PROZAC) 10 MG capsule TAKE ONE CAPSULE BY MOUTH EVERY DAY  30 capsule  0  . [DISCONTINUED] levothyroxine  (SYNTHROID, LEVOTHROID) 100 MCG tablet TAKE 1 TABLET BY MOUTH EVERY DAY  30 tablet  4  . [DISCONTINUED] pantoprazole (PROTONIX) 40 MG tablet TAKE 1 TABLET (40 MG TOTAL) BY MOUTH DAILY.  30 tablet  0  . [DISCONTINUED] celecoxib (CELEBREX) 200 MG capsule Take 1 capsule (200 mg total) by mouth daily. Take one capsule by mouth daily for 2-4 weeks.  30 capsule  1   No facility-administered encounter medications on file as of 06/29/2013.    EXAM:  BP 126/82  Pulse 76  Temp(Src) 98.2 F (36.8 C) (Oral)  Wt 245 lb (111.131 kg)  BMI 42.03 kg/m2  SpO2 98%  Body mass index is 42.03 kg/(m^2).  GENERAL: vitals reviewed and listed above, alert, oriented, appears well hydrated and in no acute distress Wt Readings from Last 3 Encounters:  06/29/13 245 lb (111.131 kg)  01/20/13 241 lb 6.4 oz (109.498 kg)  01/06/13 242 lb 1.9 oz (109.825 kg)    HEENT: atraumatic, conjunctiva  clear, no obvious abnormalities on inspection of external nose and ears OP : no lesion edema or exudate  NECK: no obvious masses on inspection palpation  Breast: normal by inspection . No dimpling, discharge, masses, tenderness or discharge . LUNGS: clear to auscultation bilaterally, no wheezes, rales or rhonchi, good air movement CV: HRRR, no clubbing cyanosis or  peripheral edema nl cap refill  MS: moves all extremities without noticeable focal  abnormality PSYCH: pleasant and cooperative, no obvious depression or anxiety Lab Results  Component Value Date   WBC 4.8 12/22/2012   HGB 12.0 12/22/2012   HCT 36.2 12/22/2012   PLT 230.0 12/22/2012   GLUCOSE 90 12/22/2012   CHOL 158 12/22/2012   TRIG 79.0 12/22/2012   HDL 38.00* 12/22/2012   LDLCALC 104* 12/22/2012   ALT 10 12/22/2012   AST 19 12/22/2012   NA 139 12/22/2012   K 3.9 12/22/2012   CL 103 12/22/2012   CREATININE 0.8 12/22/2012   BUN 13 12/22/2012   CO2 28 12/22/2012   TSH 2.51 12/22/2012   INR 1.16 01/16/2012    ASSESSMENT AND PLAN:  Discussed the following assessment  and plan:  LV hypertrophy, hypertensive  HYPOTHYROIDISM  DEPRESSION - Stable at this time  GERD  HIATAL HERNIA Appears to have good control at this time contact cardiology about need for followup were  as needed basis if it is controlled and we can take over her medication.  GERD PPI use some flare after running out of medicine I refilled her proton excellent Dr. Jarold Motto no give opinion if needed followup if she is doing well. Agree with attempt at weight loss.  Hypothyroid continue medication refill Understand her hesitancy to continuing to go to specialist if she feels okay. There've information with them. -Patient advised to return or notify health care team  if symptoms worsen or persist or new concerns arise.  Patient Instructions  Get  Your mammogram [facility] or the breast center  Check into extra hours.  Will refill  The protonix and let dr Jarold Motto know we are doing this .  If having problems get back with him.  Stay on same meds . For now.    Will  Flag dr cooper about  Taking over bp medication.  Refills.  6 months cpx labs    Neta Mends. Panosh M.D. March 14  - Left ventricle: The cavity size was normal. Wall thickness was increased in a pattern of mild LVH. Systolic function was normal. The estimated ejection fraction was in the range of 55% to 65%. Wall motion was normal; there were no regional wall motion abnormalities. Left ventricular diastolic function parameters were normal. - Aortic valve: Trivial regurgitation. - Atrial septum: No defect or patent foramen ovale was identified. - Pulmonary arteries: PA peak pressure: 33mm Hg (S).

## 2013-06-29 NOTE — Patient Instructions (Addendum)
Get  Your mammogram [facility] or the breast center  Check into extra hours.  Will refill  The protonix and let dr Jarold Motto know we are doing this .  If having problems get back with him.  Stay on same meds . For now.    Will  Flag dr cooper about  Taking over bp medication.  Refills.  6 months cpx labs

## 2013-06-30 NOTE — Progress Notes (Signed)
Sure - that's fine with me. She can see me back as needed. Happy to see her anytime if problems occur. thx -----   Message ----- From: Madelin Headings, MD Sent: 06/29/2013 8:00 PM To: Tonny Bollman, MD [facility] Pt asks if pcp can rx her ht meds And doesn't think she needs a follow up at this time cause feels well. Please advise does she need routine follow up We can take over bp med refills if You agree. Thanks [facility]

## 2013-07-01 ENCOUNTER — Other Ambulatory Visit: Payer: Self-pay | Admitting: Family Medicine

## 2013-07-01 ENCOUNTER — Telehealth: Payer: Self-pay | Admitting: Family Medicine

## 2013-07-01 NOTE — Telephone Encounter (Signed)
Patient notified by telephone.  Medication provider changed in the system to [facility].

## 2013-07-01 NOTE — Telephone Encounter (Signed)
Message copied by Nils Flack on Fri Jul 01, 2013  1:41 PM ------      Message from: [facility] [place] K      Created: Thu Jun 30, 2013 11:56 AM      Regarding: bp meds.       Tell patient that we can take over and refill Blood pressure when due and can see cardiology on a prn basis.            I didn't know where to record this in the record .            thanks      WP ------

## 2013-07-19 ENCOUNTER — Telehealth: Payer: Self-pay | Admitting: Internal Medicine

## 2013-07-19 ENCOUNTER — Other Ambulatory Visit (HOSPITAL_COMMUNITY): Payer: Self-pay | Admitting: Cardiovascular Disease

## 2013-07-19 NOTE — Telephone Encounter (Signed)
Provider name changed in the medication list.

## 2013-07-19 NOTE — Telephone Encounter (Signed)
Ok to take over the rx for this med .

## 2013-07-19 NOTE — Telephone Encounter (Signed)
Patient called to see if we sent in her diltiazem 120mg . She said Dr Fabian Sharp was going to take that over, as she does not need to see cardiology on a regular basis. I told her that according to the chart, [facility] sent the refill request to Dr Excell Seltzer at [facility] - he did ok the rx, with 3 refills.  After these refills are up, she would like Dr. Fabian Sharp to manage this, not Dr. Excell Seltzer.

## 2013-08-12 ENCOUNTER — Encounter: Payer: Self-pay | Admitting: Internal Medicine

## 2013-08-23 ENCOUNTER — Encounter: Payer: Self-pay | Admitting: Internal Medicine

## 2013-08-23 ENCOUNTER — Ambulatory Visit (INDEPENDENT_AMBULATORY_CARE_PROVIDER_SITE_OTHER): Payer: BC Managed Care – PPO | Admitting: Internal Medicine

## 2013-08-23 VITALS — BP 120/78 | HR 88 | Temp 98.4°F | Wt 247.0 lb

## 2013-08-23 DIAGNOSIS — R140 Abdominal distension (gaseous): Secondary | ICD-10-CM

## 2013-08-23 DIAGNOSIS — R1031 Right lower quadrant pain: Secondary | ICD-10-CM

## 2013-08-23 DIAGNOSIS — R141 Gas pain: Secondary | ICD-10-CM

## 2013-08-23 DIAGNOSIS — K589 Irritable bowel syndrome without diarrhea: Secondary | ICD-10-CM

## 2013-08-23 LAB — POCT URINALYSIS DIP (MANUAL ENTRY)
Bilirubin, UA: NEGATIVE
Glucose, UA: NEGATIVE
Ketones, POC UA: NEGATIVE
Nitrite, UA: NEGATIVE
Protein Ur, POC: NEGATIVE
Spec Grav, UA: <=1.005
Urobilinogen, UA: 0.2
pH, UA: 5.5

## 2013-08-23 LAB — CBC WITH DIFFERENTIAL/PLATELET
Basophils Absolute: 0 10*3/uL (ref 0.0–0.1)
Eosinophils Absolute: 0.2 10*3/uL (ref 0.0–0.7)
HCT: 37.2 % (ref 36.0–46.0)
Hemoglobin: 12.3 g/dL (ref 12.0–15.0)
Lymphs Abs: 1.8 10*3/uL (ref 0.7–4.0)
MCHC: 33.2 g/dL (ref 30.0–36.0)
MCV: 88.7 fl (ref 78.0–100.0)
Monocytes Absolute: 0.5 10*3/uL (ref 0.1–1.0)
Neutro Abs: 4.7 10*3/uL (ref 1.4–7.7)
RDW: 14.1 % (ref 11.5–14.6)

## 2013-08-23 NOTE — Patient Instructions (Addendum)
This could be a stomach virus but the discomfort  On the right side  .    Marland Kitchen   Change in bowel habits  If  persistent or progressive can proceed with further. Evaluation.  avoid sugar beverages.   caffeine juices in the short run.     Add probiotic  May help in some people.    Bloating Bloating is the feeling of fullness in your belly. You may feel as though your pants are too tight. Often the cause of bloating is overeating, retaining fluids, or having gas in your bowel. It is also caused by swallowing air and eating foods that cause gas. Irritable bowel syndrome is one of the most common causes of bloating. Constipation is also a common cause. Sometimes more serious problems can cause bloating. SYMPTOMS  Usually there is a feeling of fullness, as though your abdomen is bulged out. There may be mild discomfort.  DIAGNOSIS  Usually no particular testing is necessary for most bloating. If the condition persists and seems to become worse, your caregiver may do additional testing.  TREATMENT   There is no direct treatment for bloating.  Do not put gas into the bowel. Avoid chewing gum and sucking on candy. These tend to make you swallow air. Swallowing air can also be a nervous habit. Try to avoid this.  Avoiding high residue diets will help. Eat foods with soluble fibers (examples include root vegetables, apples, or barley) and substitute dairy products with soy and rice products. This helps irritable bowel syndrome.  If constipation is the cause, then a high residue diet with more fiber will help.  Avoid carbonated beverages.  Over-the-counter preparations are available that help reduce gas. Your pharmacist can help you with this. SEEK MEDICAL CARE IF:   Bloating continues and seems to be getting worse.  You notice a weight gain.  You have a weight loss but the bloating is getting worse.  You have changes in your bowel habits or develop nausea or vomiting. SEEK IMMEDIATE MEDICAL CARE  IF:   You develop shortness of breath or swelling in your legs.  You have an increase in abdominal pain or develop chest pain. Document Released: 08/27/2006 Document Revised: 01/19/2012 Document Reviewed: 10/15/2007 [facility] Patient Information 2014 [place], [place].

## 2013-08-23 NOTE — Progress Notes (Signed)
Chief Complaint  Patient presents with  . Diarrhea    Started on Saturday.  . Abdominal Pain  . Bloated  . Back Pain    HPI: Patient comes in today for SDA for  new problem evaluation. Acute onset 3 days ago   Right sided discomfort  And bloating  And low back pain  rlq pain   Diarrhea.  Began  After  And yesterday .   Mixed  With water and fizzy like . No blood  Dark  Not black   upt to 3-4 x per day vs 1-2 per day.  Urgency .  ? Hx of same  But conitinuing .  No fever.  Feels hot.  ROS: See pertinent positives and negatives per HPI. No blood fever syncope  Vomiting uti sx   Past Medical History  Diagnosis Date  . Allergic rhinitis   . Depression   . GERD (gastroesophageal reflux disease)   . Hiatal hernia   . Diverticulosis of colon (without mention of hemorrhage)   . Hypothyroidism   . IBS (irritable bowel syndrome)   . Anxiety   . Hemorrhoids   . Obesity   . Transaminitis     Abd [place] 12/2011 - hepatic steatosis  . Pulmonary HTN     Enlarged pulmonary artery by CT angio 12/2011 - PAP by echo 12/2011  . Myocarditis     a. NSTEMI:  12/2011 with peak troponin 2. Cath - mild nonobstructive plaque.;  b. nstemi 01/2012, relook cath - nonobs - MRI sugg of myocarditis  . Elevated LFTs   . Shingles 07/09/2012  . POLYP, ANAL AND RECTAL 09/07/2007    Qualifier: Diagnosis of  By: Lawernce Ion, CMA (AAMA), Bethann Berkshire   . Hx of echocardiogram     a. Echo 3/14:  Mild LVH, EF 55-65%, normal wall motion, PASP 33    Family History  Problem Relation Age of Onset  . Arthritis Mother     osteo  . Breast cancer Mother   . Coronary artery disease Mother   . Other Mother     hearing loss  . Diabetes Brother     50 s  . Thyroid disease Mother   . Colon cancer Neg Hx     History   Social History  . Marital Status: Divorced    Spouse Name: N/A    Number of Children: 1  . Years of Education: N/A   Occupational History  . Direct Sales Rep    Social History Main Topics  . Smoking  status: Former Smoker    Quit date: 09/11/2006  . Smokeless tobacco: Never Used  . Alcohol Use: Yes     Comment: occassional  . Drug Use: No  . Sexual Activity: Not Currently    Birth Control/ Protection: Post-menopausal   Other Topics Concern  . 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 Cholecalciferol (VITAMIN D) 400 UNITS capsule Take 400 Units by mouth daily.        Marland Kitchen diltiazem (CARDIZEM CD) 120 MG 24 hr capsule       . FLUoxetine (PROZAC) 10 MG capsule TAKE  ONE CAPSULE BY MOUTH EVERY DAY  30 capsule  11  . levothyroxine (SYNTHROID, LEVOTHROID) 100 MCG tablet TAKE 1 TABLET BY MOUTH EVERY DAY  30 tablet  5  . lisinopril (PRINIVIL,ZESTRIL) 10 MG tablet       . Loratadine (CLARITIN) 10 MG CAPS Take 10 mg by mouth daily as needed. As needed for sinus      . pantoprazole (PROTONIX) 40 MG tablet TAKE 1 TABLET (40 MG TOTAL) BY MOUTH DAILY.  30 tablet  11   No facility-administered encounter medications on file as of 08/23/2013.    EXAM:  BP 120/78  Pulse 88  Temp(Src) 98.4 F (36.9 C) (Oral)  Wt 247 lb (112.038 kg)  BMI 42.38 kg/m2  SpO2 98%  Body mass index is 42.38 kg/(m^2).  GENERAL: vitals reviewed and listed above, alert, oriented, appears well hydrated and in no acute distress non toxic  HEENT: atraumatic, conjunctiva  clear, no obvious abnormalities on inspection of external nose and ears OP : no lesion edema or exudate  NECK: no obvious masses on inspection palpation  LUNGS: clear to auscultation bilaterally, no wheezes, rales or rhonchi, good air movement CV: HRRR, no clubbing cyanosis or  peripheral edema nl cap refill  Abdomen:  Sof,t normal bowel sounds without hepatosplenomegaly, no  guarding rebound or masses no CVA tenderness points to right side but no acute pain or psoas sign   MS: moves all extremities without noticeable focal  abnormality  PSYCH: pleasant and cooperative, no obvious depression or anxiety  ASSESSMENT AND PLAN:  Discussed the following assessment and plan:  Abdominal bloating - Plan: CBC with Differential, Sedimentation rate, POCT urinalysis dipstick  Abdominal discomfort in right lower quadrant - Plan: CBC with Differential, Sedimentation rate, POCT urinalysis dipstick  IBS (irritable bowel syndrome) - Plan: CBC with Differential, Sedimentation rate, POCT urinalysis dipstick  -Patient advised to return or notify health care team  if symptoms worsen or persist or new concerns arise.  Patient Instructions  This could be a stomach virus but the discomfort  On the right side  .    Marland Kitchen   Change in bowel habits  If  persistent or progressive can proceed with further. Evaluation.  avoid sugar beverages.   caffeine juices in the short run.     Add probiotic  May help in some people.    Bloating Bloating is the feeling of fullness in your belly. You may feel as though your pants are too tight. Often the cause of bloating is overeating, retaining fluids, or having gas in your bowel. It is also caused by swallowing air and eating foods that cause gas. Irritable bowel syndrome is one of the most common causes of bloating. Constipation is also a common cause. Sometimes more serious problems can cause bloating. SYMPTOMS  Usually there is a feeling of fullness, as though your abdomen is bulged out. There may be mild discomfort.  DIAGNOSIS  Usually no particular testing is necessary for most bloating. If the condition persists and seems to become worse, your caregiver may do additional testing.  TREATMENT   There is no direct treatment for bloating.  Do not put gas into the bowel. Avoid chewing gum and sucking on candy. These tend to make you swallow air.  Swallowing air can also be a nervous habit. Try to avoid this.  Avoiding high residue diets will help. Eat foods with soluble fibers (examples include root vegetables, apples, or barley) and substitute dairy products with soy and rice products.  This helps irritable bowel syndrome.  If constipation is the cause, then a high residue diet with more fiber will help.  Avoid carbonated beverages.  Over-the-counter preparations are available that help reduce gas. Your pharmacist can help you with this. SEEK MEDICAL CARE IF:   Bloating continues and seems to be getting worse.  You notice a weight gain.  You have a weight loss but the bloating is getting worse.  You have changes in your bowel habits or develop nausea or vomiting. SEEK IMMEDIATE MEDICAL CARE IF:   You develop shortness of breath or swelling in your legs.  You have an increase in abdominal pain or develop chest pain. Document Released: 08/27/2006 Document Revised: 01/19/2012 Document Reviewed: 10/15/2007 [facility] Patient Information 2014 [place], [place].      Neta Mends. Panosh M.D.

## 2013-08-25 ENCOUNTER — Encounter: Payer: Self-pay | Admitting: Internal Medicine

## 2013-10-21 ENCOUNTER — Ambulatory Visit (INDEPENDENT_AMBULATORY_CARE_PROVIDER_SITE_OTHER): Payer: BC Managed Care – PPO | Admitting: Family Medicine

## 2013-10-21 ENCOUNTER — Encounter: Payer: Self-pay | Admitting: Family Medicine

## 2013-10-21 VITALS — BP 130/80 | HR 84 | Temp 98.3°F | Resp 20 | Wt 252.0 lb

## 2013-10-21 DIAGNOSIS — J329 Chronic sinusitis, unspecified: Secondary | ICD-10-CM

## 2013-10-21 NOTE — Progress Notes (Signed)
Pre-visit discussion using our clinic review tool. No additional management support is needed unless otherwise documented below in the visit note.  

## 2013-10-21 NOTE — Patient Instructions (Signed)

## 2013-10-21 NOTE — Progress Notes (Signed)
Chief Complaint  Patient presents with  . Sinus Problem  . Nasal Congestion    HPI:  -started: 1 week ago -symptoms:nasal congestion, sore throat, cough, hoarsness -denies:fever, SOB, NVD, tooth pain, sinus pain -has tried: claritin, claritin D -sick contacts/travel/risks: denies flu exposure, tick exposure or or Ebola risks -Hx of: allergies  ROS: See pertinent positives and negatives per HPI.  Past Medical History  Diagnosis Date  . Allergic rhinitis   . Depression   . GERD (gastroesophageal reflux disease)   . Hiatal hernia   . Diverticulosis of colon (without mention of hemorrhage)   . Hypothyroidism   . IBS (irritable bowel syndrome)   . Anxiety   . Hemorrhoids   . Obesity   . Transaminitis     Abd [place] 12/2011 - hepatic steatosis  . Pulmonary HTN     Enlarged pulmonary artery by CT angio 12/2011 - PAP by echo 12/2011  . Myocarditis     a. NSTEMI:  12/2011 with peak troponin 2. Cath - mild nonobstructive plaque.;  b. nstemi 01/2012, relook cath - nonobs - MRI sugg of myocarditis  . Elevated LFTs   . Shingles 07/09/2012  . POLYP, ANAL AND RECTAL 09/07/2007    Qualifier: Diagnosis of  By: Lawernce Ion, CMA (AAMA), Bethann Berkshire   . Hx of echocardiogram     a. Echo 3/14:  Mild LVH, EF 55-65%, normal wall motion, PASP 33    Past Surgical History  Procedure Laterality Date  . Cholecystectomy    . Tonsillectomy and adenoidectomy      Family History  Problem Relation Age of Onset  . Arthritis Mother     osteo  . Breast cancer Mother   . Coronary artery disease Mother   . Other Mother     hearing loss  . Diabetes Brother     50 s  . Thyroid disease Mother   . Colon cancer Neg Hx     History   Social History  . Marital Status: Divorced    Spouse Name: N/A    Number of Children: 1  . Years of Education: N/A   Occupational History  . Direct Sales Rep    Social History Main Topics  . Smoking status: Former Smoker    Quit date: 09/11/2006  . Smokeless  tobacco: Never Used  . Alcohol Use: Yes     Comment: occassional  . Drug Use: No  . Sexual Activity: Not Currently    Birth Control/ Protection: Post-menopausal   Other Topics Concern  . 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  , Disp: , Rfl: ;  diltiazem (CARDIZEM CD) 120 MG 24 hr capsule, , Disp: , Rfl: ;  FLUoxetine (PROZAC) 10 MG capsule, TAKE ONE CAPSULE BY MOUTH EVERY DAY, Disp: 30 capsule, Rfl: 11;  levothyroxine (SYNTHROID, LEVOTHROID) 100 MCG tablet, TAKE 1 TABLET BY MOUTH EVERY DAY, Disp: 30 tablet, Rfl: 5 lisinopril (PRINIVIL,ZESTRIL) 10 MG tablet, , Disp: , Rfl: ;  Loratadine (CLARITIN) 10 MG CAPS, Take 10 mg by mouth daily as needed. As needed for sinus, Disp: , Rfl: ;  pantoprazole (PROTONIX)  40 MG tablet, TAKE 1 TABLET (40 MG TOTAL) BY MOUTH DAILY., Disp: 30 tablet, Rfl: 11  EXAM:  Filed Vitals:   10/21/13 1502  BP: 130/80  Pulse: 84  Temp: 98.3 F (36.8 C)  Resp: 20    Body mass index is 43.23 kg/(m^2).  GENERAL: vitals reviewed and listed above, alert, oriented, appears well hydrated and in no acute distress  HEENT: atraumatic, conjunttiva clear, no obvious abnormalities on inspection of external nose and ears, normal appearance of ear canals and TMs, clear nasal congestion, mild post oropharyngeal erythema with PND, no tonsillar edema or exudate, no sinus TTP  NECK: no obvious masses on inspection  LUNGS: clear to auscultation bilaterally, no wheezes, rales or rhonchi, good air movement  CV: HRRR, no peripheral edema  MS: moves all extremities without noticeable abnormality  PSYCH: pleasant and cooperative, no obvious depression or anxiety  ASSESSMENT  AND PLAN:  Discussed the following assessment and plan:  Rhinosinusitis  -given HPI and exam findings today, a serious infection or illness is unlikely. We discussed potential etiologies, with VURI being most likely, and advised supportive care and monitoring. We discussed treatment side effects, likely course, antibiotic misuse, transmission, and signs of developing a serious illness. -of course, we advised to return or notify a doctor immediately if symptoms worsen or persist or new concerns arise.    Patient Instructions  INSTRUCTIONS FOR UPPER RESPIRATORY INFECTION:  -plenty of rest and fluids  -nasal saline wash 2-3 times daily (use prepackaged nasal saline or bottled/distilled water if making your own)   -can use sinex or afrin nasal spray for drainage and nasal congestion - but do NOT use longer then 3-4 days  -can use tylenol or ibuprofen as directed for aches and sorethroat  -in the winter time, using a humidifier at night is helpful (please follow cleaning instructions)  -if you are taking a cough medication - use only as directed, may also try a teaspoon of honey to coat the throat and throat lozenges  -for sore throat, salt water gargles can help  -follow up if you have fevers, facial pain, tooth pain, difficulty breathing or are worsening or not getting better in 5-7 days      KIM, HANNAH R.

## 2013-11-16 ENCOUNTER — Other Ambulatory Visit: Payer: Self-pay | Admitting: Family Medicine

## 2013-11-16 ENCOUNTER — Telehealth: Payer: Self-pay | Admitting: Internal Medicine

## 2013-11-16 MED ORDER — DILTIAZEM HCL ER COATED BEADS 120 MG PO CP24: 120.0000 mg | ORAL_CAPSULE | Freq: Every day | ORAL | Status: DC

## 2013-11-16 MED ORDER — LISINOPRIL 10 MG PO TABS: 10.0000 mg | ORAL_TABLET | Freq: Every day | ORAL | Status: DC

## 2013-11-16 NOTE — Telephone Encounter (Signed)
Ok to refill x 6 months  She has cpx  In February.

## 2013-11-16 NOTE — Telephone Encounter (Signed)
Pt needs new rx lisinopril 10 mg #30 w/refills and diltiazem 120 mg #30 w/REFILLS call into [facility] [address]. These meds was originally prescribed by her cardiologist

## 2013-11-16 NOTE — Telephone Encounter (Signed)
Sent to the pharmacy by e-scribe. 

## 2013-12-23 ENCOUNTER — Other Ambulatory Visit: Payer: BC Managed Care – PPO

## 2013-12-23 ENCOUNTER — Other Ambulatory Visit (INDEPENDENT_AMBULATORY_CARE_PROVIDER_SITE_OTHER): Payer: BC Managed Care – PPO

## 2013-12-23 DIAGNOSIS — Z0000 Encounter for general adult medical examination without abnormal findings: Secondary | ICD-10-CM

## 2013-12-23 LAB — CBC WITH DIFFERENTIAL/PLATELET
BASOS PCT: 0.4 % (ref 0.0–3.0)
Basophils Absolute: 0 10*3/uL (ref 0.0–0.1)
EOS PCT: 3.9 % (ref 0.0–5.0)
Eosinophils Absolute: 0.2 10*3/uL (ref 0.0–0.7)
HCT: 37.9 % (ref 36.0–46.0)
HEMOGLOBIN: 11.9 g/dL — AB (ref 12.0–15.0)
LYMPHS PCT: 24.5 % (ref 12.0–46.0)
Lymphs Abs: 1.5 10*3/uL (ref 0.7–4.0)
MCHC: 31.5 g/dL (ref 30.0–36.0)
MCV: 91.4 fl (ref 78.0–100.0)
Monocytes Absolute: 0.5 10*3/uL (ref 0.1–1.0)
Monocytes Relative: 7.6 % (ref 3.0–12.0)
NEUTROS ABS: 3.8 10*3/uL (ref 1.4–7.7)
NEUTROS PCT: 63.6 % (ref 43.0–77.0)
Platelets: 252 10*3/uL (ref 150.0–400.0)
RBC: 4.15 Mil/uL (ref 3.87–5.11)
RDW: 15.1 % — ABNORMAL HIGH (ref 11.5–14.6)
WBC: 6 10*3/uL (ref 4.5–10.5)

## 2013-12-23 LAB — HEPATIC FUNCTION PANEL
ALBUMIN: 3.6 g/dL (ref 3.5–5.2)
ALK PHOS: 58 U/L (ref 39–117)
ALT: 11 U/L (ref 0–35)
AST: 23 U/L (ref 0–37)
BILIRUBIN DIRECT: 0.1 mg/dL (ref 0.0–0.3)
TOTAL PROTEIN: 7 g/dL (ref 6.0–8.3)
Total Bilirubin: 1.2 mg/dL (ref 0.3–1.2)

## 2013-12-23 LAB — BASIC METABOLIC PANEL
BUN: 11 mg/dL (ref 6–23)
CALCIUM: 9.3 mg/dL (ref 8.4–10.5)
CHLORIDE: 106 meq/L (ref 96–112)
CO2: 26 meq/L (ref 19–32)
CREATININE: 0.9 mg/dL (ref 0.4–1.2)
GFR: 68.61 mL/min (ref >60.00)
Glucose, Bld: 95 mg/dL (ref 70–99)
Potassium: 4.9 mEq/L (ref 3.5–5.1)
SODIUM: 145 meq/L (ref 135–145)

## 2013-12-23 LAB — LIPID PANEL
CHOL/HDL RATIO: 4
CHOLESTEROL: 171 mg/dL (ref 0–200)
HDL: 40.9 mg/dL (ref >39.00)
LDL CALC: 111 mg/dL — AB (ref 0–99)
Triglycerides: 95 mg/dL (ref 0.0–149.0)
VLDL: 19 mg/dL (ref 0.0–40.0)

## 2013-12-23 LAB — TSH: TSH: 2.86 u[IU]/mL (ref 0.35–5.50)

## 2013-12-27 ENCOUNTER — Other Ambulatory Visit: Payer: Self-pay | Admitting: Internal Medicine

## 2013-12-30 ENCOUNTER — Ambulatory Visit (INDEPENDENT_AMBULATORY_CARE_PROVIDER_SITE_OTHER): Payer: BC Managed Care – PPO | Admitting: Internal Medicine

## 2013-12-30 ENCOUNTER — Encounter: Payer: Self-pay | Admitting: Internal Medicine

## 2013-12-30 VITALS — BP 138/90 | HR 79 | Temp 97.9°F | Ht 64.0 in | Wt 253.0 lb

## 2013-12-30 DIAGNOSIS — E039 Hypothyroidism, unspecified: Secondary | ICD-10-CM

## 2013-12-30 DIAGNOSIS — E669 Obesity, unspecified: Secondary | ICD-10-CM

## 2013-12-30 DIAGNOSIS — D649 Anemia, unspecified: Secondary | ICD-10-CM

## 2013-12-30 DIAGNOSIS — E063 Autoimmune thyroiditis: Secondary | ICD-10-CM

## 2013-12-30 DIAGNOSIS — Z0000 Encounter for general adult medical examination without abnormal findings: Secondary | ICD-10-CM | POA: Insufficient documentation

## 2013-12-30 DIAGNOSIS — I119 Hypertensive heart disease without heart failure: Secondary | ICD-10-CM

## 2013-12-30 DIAGNOSIS — E782 Mixed hyperlipidemia: Secondary | ICD-10-CM

## 2013-12-30 DIAGNOSIS — K219 Gastro-esophageal reflux disease without esophagitis: Secondary | ICD-10-CM

## 2013-12-30 NOTE — Patient Instructions (Addendum)
Get a mammogram Monitor blood pressure readings ocassionally. Goal is below 140/90  lifestyle intervention healthy eating and exercise . 150 minutes of exercise weeks  ,  Lose weight  To healthy levels. Avoid trans fats and processed foods;  Increase fresh fruits and veges to 5 servings per day. And avoid sweet beverages  Including tea and juice.  Do stool tests to bring in to make sure no microscopic blood in stool.   Recheck  Blood count and iron tests in about 3-4 months and fu visit or  Depending on how your are doing.   Shingles vaccine if covered .

## 2013-12-30 NOTE — Progress Notes (Signed)
Chief Complaint  Patient presents with  . Annual Exam  . Hypertension    HPI: Patient comes in today for Preventive Health Care visit  No major change in health status since last visit .  BP:  Doing work . Taking medications lisinopril and diltiazem. 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 They're very busy. Fluoxetine is helpful wishes to stay on it.  Hasn't gotten a mammogram like she promised is aware  No history of bleeding stomach problems are better on the proton X. No blood in stool is up-to-date on colonoscopy that she is aware 2009.  Hasn't had shingles vaccine but has had shingles about a year ago uncertain of reimbursement.  Thyroid on continued medication.   Health Maintenance  Topic Date Due  . Mammogram  05/01/2003  . Zostavax  04/30/2013  . Influenza Vaccine  06/10/2014  . Pap Smear  12/30/2015  . Colonoscopy  11/10/2017  . Tetanus/tdap  04/01/2020   Health Maintenance Review  ROS:  GEN/ HEENT: No fever, significant weight changes sweats headaches vision problems hearing changes, CV/ PULM; No chest pain shortness of breath cough, syncope,edema  change in exercise tolerance. GI /GU: No adominal pain, vomiting, change in bowel habits. No blood in the stool. No significant GU symptoms. SKIN/HEME: ,no acute skin rashes suspicious lesions or bleeding. No lymphadenopathy, nodules, masses.  NEURO/ PSYCH:  No neurologic signs such as weakness numbness. No depression anxiety. IMM/ Allergy: No unusual infections.  Allergy .   REST of 12 system review negative except as per HPI   Past Medical History  Diagnosis Date  . Allergic rhinitis   . Depression   . GERD (gastroesophageal reflux disease)   . Hiatal hernia   . Diverticulosis of colon (without mention of hemorrhage)   . Hypothyroidism   . IBS (irritable bowel syndrome)   . Anxiety   . Hemorrhoids   . Obesity   . Transaminitis     Abd [place] 12/2011 - hepatic steatosis  . Pulmonary HTN       Enlarged pulmonary artery by CT angio 12/2011 - PAP by echo 12/2011  . Myocarditis     a. NSTEMI:  12/2011 with peak troponin 2. Cath - mild nonobstructive plaque.;  b. nstemi 01/2012, relook cath - nonobs - MRI sugg of myocarditis  . Elevated LFTs   . Shingles 07/09/2012  . POLYP, ANAL AND RECTAL 09/07/2007    Qualifier: Diagnosis of  By: Lawernce Ion, CMA (AAMA), Bethann Berkshire   . Hx of echocardiogram     a. Echo 3/14:  Mild LVH, EF 55-65%, normal wall motion, PASP 33    Family History  Problem Relation Age of Onset  . Arthritis Mother     osteo  . Breast cancer Mother   . Coronary artery disease Mother   . Other Mother     hearing loss  . Diabetes Brother     50 s  . Thyroid disease Mother   . Colon cancer Neg Hx     History   Social History  . Marital Status: Divorced    Spouse Name: N/A    Number of Children: 1  . Years of Education: N/A   Occupational History  . Direct Sales Rep    Social History Main Topics  . Smoking status: Former Smoker    Quit date: 09/11/2006  . Smokeless tobacco: Never Used  . Alcohol Use: Yes     Comment: occassional  . Drug Use:  No  . Sexual Activity: Not Currently    Birth Control/ Protection: Post-menopausal   Other Topics Concern  . 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   HH of 1   Divorced   Regular exercise- no   Was caretaker for mom who died in Oct 31 2008   G1P1   0 Caffeine drinks daily     Outpatient Encounter Prescriptions as of 12/30/2013  Medication Sig  . Cholecalciferol (VITAMIN D) 400 UNITS capsule Take 400 Units by mouth daily.    Marland Kitchen. diltiazem (CARDIZEM CD) 120 MG 24 hr capsule Take 1 capsule (120 mg total) by mouth daily.  Marland Kitchen. FLUoxetine (PROZAC) 10 MG capsule TAKE ONE CAPSULE BY MOUTH EVERY DAY  . levothyroxine (SYNTHROID, LEVOTHROID) 100 MCG tablet TAKE 1 TABLET BY MOUTH EVERY DAY  . lisinopril (PRINIVIL,ZESTRIL) 10 MG tablet Take 1 tablet (10  mg total) by mouth daily.  . Loratadine (CLARITIN) 10 MG CAPS Take 10 mg by mouth daily as needed. As needed for sinus  . pantoprazole (PROTONIX) 40 MG tablet TAKE 1 TABLET (40 MG TOTAL) BY MOUTH DAILY.    EXAM:  BP 138/90  Pulse 79  Temp(Src) 97.9 F (36.6 C) (Oral)  Ht 5\' 4"  (1.626 m)  Wt 253 lb (114.76 kg)  BMI 43.41 kg/m2  SpO2 98%  Body mass index is 43.41 kg/(m^2).  Physical Exam: Vital signs reviewed NUU:VOZDGEN:This is a well-developed well-nourished alert cooperative   female who appears her stated age in no acute distress.  HEENT: normocephalic atraumatic , Eyes: PERRL EOM's full, conjunctiva clear, Nares: paten,t no deformity discharge or tenderness., Ears: no deformity EAC's clear TMs with normal landmarks. Mouth: clear OP, no lesions, edema.  Moist mucous membranes. Dentition in adequate repair. NECK: supple without masses, thyromegaly or bruits. CHEST/PULM:  Clear to auscultation and percussion breath sounds equal no wheeze , rales or rhonchi. No chest wall deformities or tenderness. Breast: normal by inspection . No dimpling, discharge, masses, tenderness or discharge . CV: PMI is nondisplaced, S1 S2 no gallops, murmurs, rubs. Peripheral pulses are full without delay.No JVD .  ABDOMEN: Bowel sounds normal nontender  No guard or rebound, no hepato splenomegal no CVA tenderness.  No hernia. Extremtities:  No clubbing cyanosis or edema, no acute joint swelling or redness no focal atrophy NEURO:  Oriented x3, cranial nerves 3-12 appear to be intact, no obvious focal weakness,gait within normal limits no abnormal reflexes or asymmetrical SKIN: No acute rashes normal turgor, color, no bruising or petechiae. PSYCH: Oriented, good eye contact, no obvious depression anxiety, cognition and judgment appear normal. LN: no cervical axillary inguinal adenopathy  Lab Results  Component Value Date   WBC 6.0 12/23/2013   HGB 11.9* 12/23/2013   HCT 37.9 12/23/2013   PLT 252.0 12/23/2013    GLUCOSE 95 12/23/2013   CHOL 171 12/23/2013   TRIG 95.0 12/23/2013   HDL 40.90 12/23/2013   LDLCALC 111* 12/23/2013   ALT 11 12/23/2013   AST 23 12/23/2013   NA 145 12/23/2013   K 4.9 12/23/2013   CL 106 12/23/2013   CREATININE 0.9 12/23/2013   BUN 11 12/23/2013   CO2 26 12/23/2013   TSH 2.86 12/23/2013   INR 1.16 01/16/2012    ASSESSMENT AND PLAN:  Discussed the following assessment and plan:  Visit for preventive health examination - get a mammogram fam hx breast cancer in mom  LV hypertrophy, hypertensive -  slightly up today check at hoem had been controlled will fu at next visit   Obesity (BMI 35.0-39.9 without comorbidity)  HYPERLIPIDEMIA  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  Patient Care Team: Madelin Headings, MD as PCP - General Mardella Layman, MD (Gastroenterology) Tonny Bollman, MD (Cardiology) Patient Instructions  Get a mammogram Monitor blood pressure readings ocassionally. Goal is below 140/90  lifestyle intervention healthy eating and exercise . 150 minutes of exercise weeks  ,  Lose weight  To healthy levels. Avoid trans fats and processed foods;  Increase fresh fruits and veges to 5 servings per day. And avoid sweet beverages  Including tea and juice.  Do stool tests to bring in to make sure no microscopic blood in stool.   Recheck  Blood count and iron tests in about 3-4 months and fu visit or  Depending on how your are doing.   Shingles vaccine if covered .    Neta Mends. Tylasia Fletchall M.D.  Pre visit review using our clinic review tool, if applicable. No additional management support is needed unless otherwise documented below in the visit note.

## 2014-02-13 ENCOUNTER — Encounter: Payer: Self-pay | Admitting: Internal Medicine

## 2014-02-13 ENCOUNTER — Ambulatory Visit (INDEPENDENT_AMBULATORY_CARE_PROVIDER_SITE_OTHER): Payer: BC Managed Care – PPO | Admitting: Internal Medicine

## 2014-02-13 VITALS — BP 146/82 | HR 76 | Temp 98.5°F | Ht 64.0 in | Wt 259.0 lb

## 2014-02-13 DIAGNOSIS — Z9189 Other specified personal risk factors, not elsewhere classified: Secondary | ICD-10-CM

## 2014-02-13 DIAGNOSIS — Z9289 Personal history of other medical treatment: Secondary | ICD-10-CM

## 2014-02-13 DIAGNOSIS — R0789 Other chest pain: Secondary | ICD-10-CM

## 2014-02-13 DIAGNOSIS — I119 Hypertensive heart disease without heart failure: Secondary | ICD-10-CM

## 2014-02-13 DIAGNOSIS — M7989 Other specified soft tissue disorders: Secondary | ICD-10-CM

## 2014-02-13 MED ORDER — CELECOXIB 200 MG PO CAPS: 200.0000 mg | ORAL_CAPSULE | Freq: Every day | ORAL | Status: DC

## 2014-02-13 MED ORDER — LISINOPRIL-HYDROCHLOROTHIAZIDE 10-12.5 MG PO TABS: 1.0000 | ORAL_TABLET | Freq: Every day | ORAL | Status: DC

## 2014-02-13 NOTE — Patient Instructions (Addendum)
Celebrex can decrease inflammation but can cause fluid retention  sometimes and interfere with bp control. Would wait on that medication for now . Until bp better  ? 1 week or so  Change  lisinopril to diuretic combo  For now.  And then close follow up.

## 2014-02-13 NOTE — Progress Notes (Signed)
Chief Complaint  Patient presents with  . Hypertension    Has feet swelling.  Would like a refill of Celebrex.  . Edema    HPI: Patient comes in today for SDA for  problem evaluation. Acute visit per schedulers  Seen 6 weeks ago and told to monitor BP readings for HT  bp 148 and such area .   "I think i may have myocarditis.  Again as has had it x 2 and did well ; Used  Colchicine and  Then celebrex.  Got better )  See hoep an cards eval in  NotedsomeSwelling  No sob ut mild chest discomfort   No cough  5 week   Boss out and severe stress  Wonder if affecting things ? If bp up ROS: See pertinent positives and negatives per HPI.  Past Medical History  Diagnosis Date  . Allergic rhinitis   . Depression   . GERD (gastroesophageal reflux disease)   . Hiatal hernia   . Diverticulosis of colon (without mention of hemorrhage)   . Hypothyroidism   . IBS (irritable bowel syndrome)   . Anxiety   . Hemorrhoids   . Obesity   . Transaminitis     Abd [place] 12/2011 - hepatic steatosis  . Pulmonary HTN     Enlarged pulmonary artery by CT angio 12/2011 - PAP by echo 12/2011  . Myocarditis     a. NSTEMI:  12/2011 with peak troponin 2. Cath - mild nonobstructive plaque.;  b. nstemi 01/2012, relook cath - nonobs - MRI sugg of myocarditis  . Elevated LFTs   . Shingles 07/09/2012  . POLYP, ANAL AND RECTAL 09/07/2007    Qualifier: Diagnosis of  By: Lawernce Ion, CMA (AAMA), Bethann Berkshire   . Hx of echocardiogram     a. Echo 3/14:  Mild LVH, EF 55-65%, normal wall motion, PASP 33    Family History  Problem Relation Age of Onset  . Arthritis Mother     osteo  . Breast cancer Mother   . Coronary artery disease Mother   . Other Mother     hearing loss  . Diabetes Brother     50 s  . Thyroid disease Mother   . Colon cancer Neg Hx     History   Social History  . Marital Status: Divorced    Spouse Name: N/A    Number of Children: 1  . Years of Education: N/A   Occupational History  .  Direct Sales Rep    Social History Main Topics  . Smoking status: Former Smoker    Quit date: 09/11/2006  . Smokeless tobacco: Never Used  . Alcohol Use: Yes     Comment: occassional  . Drug Use: No  . Sexual Activity: Not Currently    Birth Control/ Protection: Post-menopausal   Other Topics Concern  . 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 Cholecalciferol (VITAMIN D) 400 UNITS capsule Take 400 Units by mouth daily.    Marland Kitchen diltiazem (CARDIZEM CD) 120 MG 24 hr capsule Take 1 capsule (120 mg total) by mouth daily.  Marland Kitchen FLUoxetine (PROZAC) 10 MG  capsule TAKE ONE CAPSULE BY MOUTH EVERY DAY  . levothyroxine (SYNTHROID, LEVOTHROID) 100 MCG tablet TAKE 1 TABLET BY MOUTH EVERY DAY  . lisinopril (PRINIVIL,ZESTRIL) 10 MG tablet Take 1 tablet (10 mg total) by mouth daily.  . Loratadine (CLARITIN) 10 MG CAPS Take 10 mg by mouth daily as needed. As needed for sinus  . pantoprazole (PROTONIX) 40 MG tablet TAKE 1 TABLET (40 MG TOTAL) BY MOUTH DAILY.  . celecoxib (CELEBREX) 200 MG capsule Take 1 capsule (200 mg total) by mouth daily. If needed for pain  . lisinopril-hydrochlorothiazide (PRINZIDE,ZESTORETIC) 10-12.5 MG per tablet Take 1 tablet by mouth daily.    EXAM:  BP 146/82  Pulse 76  Temp(Src) 98.5 F (36.9 C) (Oral)  Ht 5\' 4"  (1.626 m)  Wt 259 lb (117.482 kg)  BMI 44.44 kg/m2  SpO2 97%  Body mass index is 44.44 kg/(m^2).  GENERAL: vitals reviewed and listed above, alert, oriented, appears well hydrated and in no acute distress HEENT: atraumatic, conjunctiva  clear, no obvious abnormalities on inspection of external nose and ears OP : no lesion edema or exudate  NECK: no obvious masses on inspection palpation  No jvd    LUNGS: clear to auscultation bilaterally, no wheezes, rales or rhonchi, good air movement CV: HRRR, no clubbing cyanosis or  1+ edema near ankles peripheral edema nl cap refill  MS: moves all extremities without noticeable focal  abnormality PSYCH: pleasant and cooperative, no obvious depression mildy anxious Lab Results  Component Value Date   WBC 6.0 12/23/2013   HGB 11.9* 12/23/2013   HCT 37.9 12/23/2013   PLT 252.0 12/23/2013   GLUCOSE 95 12/23/2013   CHOL 171 12/23/2013   TRIG 95.0 12/23/2013   HDL 40.90 12/23/2013   LDLCALC 111* 12/23/2013   ALT 11 12/23/2013   AST 23 12/23/2013   NA 145 12/23/2013   K 4.9 12/23/2013   CL 106 12/23/2013   CREATININE 0.9 12/23/2013   BUN 11 12/23/2013   CO2 26 12/23/2013   TSH 2.86 12/23/2013   INR 1.16 01/16/2012  ekg no acute findings normal  ASSESSMENT AND PLAN:  Discussed the following assessment and plan:  Chest discomfort - non descript poss GI  but has risk pt thinks that myocarditis may rbe recurrent butthat doesnt seem likely based oncurrent data - Plan: EKG 12-Lead  Leg swelling - poss dependent ccb andobesity related rx BP with diurteic and then fu  - Plan: EKG 12-Lead  Unspecified hypertensive heart disease without heart failure - add  diuretic to acei - Plan: EKG 12-Lead  Hx of echocardiogram Hx of elevaetd pap on echo 2014 consider repeat if sx  persistent or progressive   Risk benefit of medication discussed. Of celebrex discussed  -Patient advised to return or notify health care team  if symptoms worsen ,persist or new concerns arise.  Patient Instructions  Celebrex can decrease inflammation but can cause fluid retention  sometimes and interfere with bp control. Would wait on that medication for now . Until bp better  ? 1 week or so  Change  lisinopril to diuretic combo  For now.  And then close follow up.     Neta MendsWanda K. Panosh M.D.  Pre visit review using our clinic review tool, if applicable. No additional management support is  needed unless otherwise documented below in the visit note.

## 2014-02-18 DIAGNOSIS — Z9289 Personal history of other medical treatment: Secondary | ICD-10-CM | POA: Insufficient documentation

## 2014-03-14 ENCOUNTER — Ambulatory Visit: Payer: BC Managed Care – PPO | Admitting: Internal Medicine

## 2014-04-21 ENCOUNTER — Other Ambulatory Visit: Payer: BC Managed Care – PPO

## 2014-04-28 ENCOUNTER — Ambulatory Visit: Payer: BC Managed Care – PPO | Admitting: Internal Medicine

## 2014-05-02 ENCOUNTER — Ambulatory Visit: Payer: BC Managed Care – PPO | Admitting: Internal Medicine

## 2014-07-05 ENCOUNTER — Other Ambulatory Visit: Payer: Self-pay | Admitting: Internal Medicine

## 2014-07-05 NOTE — Telephone Encounter (Signed)
Sent to the pharmacy by e-scribe. 

## 2014-08-06 ENCOUNTER — Other Ambulatory Visit: Payer: Self-pay | Admitting: Internal Medicine

## 2014-08-07 NOTE — Telephone Encounter (Signed)
Patient did not return for follow up.  Please advise.

## 2014-08-09 ENCOUNTER — Telehealth: Payer: Self-pay | Admitting: Family Medicine

## 2014-08-09 MED ORDER — LISINOPRIL-HYDROCHLOROTHIAZIDE 10-12.5 MG PO TABS: 1.0000 | ORAL_TABLET | Freq: Every day | ORAL | Status: DC

## 2014-08-09 NOTE — Telephone Encounter (Signed)
[facility] patient needs to be seen in the next 30 days.  She is past due for her follow up.  Sent in lisinopril-hctz #30 to the pharmacy. Please call the patient and schedule her appointment.  Thanks!

## 2014-08-09 NOTE — Telephone Encounter (Signed)
lmom for pt to sch appt °

## 2014-08-09 NOTE — Telephone Encounter (Signed)
Lisinopril-hctz sent to the pharmacy by e-scribe.  Filled for 30 days.  Will send a message to the front desk to have pt come in for follow up.

## 2014-08-09 NOTE — Telephone Encounter (Signed)
Please note that we had changed to lisinopril hctz  thius request doesn't fit with record . Contact patient  What is she taking ? Make appt  And refill which she is on for 2-4 weeks until she can come in for ov

## 2014-08-10 ENCOUNTER — Encounter: Payer: Self-pay | Admitting: Gastroenterology

## 2014-08-18 NOTE — Telephone Encounter (Signed)
lmom for pt to cb

## 2014-08-22 NOTE — Telephone Encounter (Signed)
lmom for pt to sch appt °

## 2014-08-25 NOTE — Telephone Encounter (Signed)
Pt has made appt for 10/20

## 2014-08-29 ENCOUNTER — Ambulatory Visit (INDEPENDENT_AMBULATORY_CARE_PROVIDER_SITE_OTHER): Payer: Self-pay | Admitting: Internal Medicine

## 2014-08-29 ENCOUNTER — Encounter: Payer: Self-pay | Admitting: Internal Medicine

## 2014-08-29 VITALS — BP 124/78 | Temp 98.4°F | Ht 64.0 in | Wt 235.3 lb

## 2014-08-29 DIAGNOSIS — I119 Hypertensive heart disease without heart failure: Secondary | ICD-10-CM

## 2014-08-29 DIAGNOSIS — Z5989 Other problems related to housing and economic circumstances: Secondary | ICD-10-CM

## 2014-08-29 DIAGNOSIS — Z5971 Insufficient health insurance coverage: Secondary | ICD-10-CM

## 2014-08-29 DIAGNOSIS — E063 Autoimmune thyroiditis: Secondary | ICD-10-CM

## 2014-08-29 DIAGNOSIS — Z598 Other problems related to housing and economic circumstances: Secondary | ICD-10-CM

## 2014-08-29 MED ORDER — LISINOPRIL-HYDROCHLOROTHIAZIDE 10-12.5 MG PO TABS: 1.0000 | ORAL_TABLET | Freq: Every day | ORAL | Status: DC

## 2014-08-29 NOTE — Patient Instructions (Addendum)
Stay  on the combo pill .  Can refill diuretic combo .  Seems to be best for control . Will need  Lab tests done in the next 4-6 months .  To check potassium thyroid etc .  [facility] [place] . For [place] .with no insurance coverage.   Preventive  Visit with labs in 5-6 months

## 2014-08-29 NOTE — Progress Notes (Signed)
Pre visit review using our clinic review tool, if applicable. No additional management support is needed unless otherwise documented below in the visit note.  Chief Complaint  Patient presents with  . Medication Follow Up    HPI: Shelby Wong 63 y.o. for lat fu ov blood pressure  Etc  See last check   6 months ago  Swelling got worse  And then  Went back to plain acei  When better then went back .  To refill.  Went off the  Diltiazem   From before cause didn't think needed at that time .  bp sees ok and feels ok  ROS: See pertinent positives and negatives per HPI. Taking otc nexium instead of protonix Got laid off of job   Last  April.  And now living with family looking for job tried to get insurance frm exchanges but many problems with signing up so just gave up.!  Looking for a job.  Past Medical History  Diagnosis Date  . Allergic rhinitis   . Depression   . GERD (gastroesophageal reflux disease)   . Hiatal hernia   . Diverticulosis of colon (without mention of hemorrhage)   . Hypothyroidism   . IBS (irritable bowel syndrome)   . Anxiety   . Hemorrhoids   . Obesity   . Transaminitis     Abd [place] 12/2011 - hepatic steatosis  . Pulmonary HTN     Enlarged pulmonary artery by CT angio 12/2011 - PAP 46mmHg by echo 12/2011  . Myocarditis     a. NSTEMI:  12/2011 with peak troponin 2. Cath - mild nonobstructive plaque.;  b. nstemi 01/2012, relook cath - nonobs - MRI sugg of myocarditis  . Elevated LFTs   . Shingles 07/09/2012  . POLYP, ANAL AND RECTAL 09/07/2007    Qualifier: Diagnosis of  By: Lawernce Ionranford, CMA (AAMA), Bethann BerkshireShannon S   . Hx of echocardiogram     a. Echo 3/14:  Mild LVH, EF 55-65%, normal wall motion, PASP 33    Family History  Problem Relation Age of Onset  . Arthritis Mother     osteo  . Breast cancer Mother   . Coronary artery disease Mother   . Other Mother     hearing loss  . Diabetes Brother     50 s  . Thyroid disease Mother   . Colon cancer Neg Hx     History    Social History  . Marital Status: Divorced    Spouse Name: N/A    Number of Children: 1  . Years of Education: N/A   Occupational History  . Direct Sales Rep    Social History Main Topics  . Smoking status: Former Smoker    Quit date: 09/11/2006  . Smokeless tobacco: Never Used  . Alcohol Use: Yes     Comment: occassional  . Drug Use: No  . Sexual Activity: Not Currently    Birth Control/ Protection: Post-menopausal   Other Topics Concern  . 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 Cholecalciferol (VITAMIN D) 400 UNITS capsule Take  400 Units by mouth daily.    . Esomeprazole Magnesium (NEXIUM PO) Take by mouth.  Marland Kitchen FLUoxetine (PROZAC) 10 MG capsule TAKE ONE CAPSULE BY MOUTH ONCE DAILY  . levothyroxine (SYNTHROID, LEVOTHROID) 100 MCG tablet TAKE 1 TABLET BY MOUTH EVERY DAY  . lisinopril-hydrochlorothiazide (PRINZIDE,ZESTORETIC) 10-12.5 MG per tablet Take 1 tablet by mouth daily.  . Loratadine (CLARITIN) 10 MG CAPS Take 10 mg by mouth daily as needed. As needed for sinus  . [DISCONTINUED] lisinopril-hydrochlorothiazide (PRINZIDE,ZESTORETIC) 10-12.5 MG per tablet Take 1 tablet by mouth daily.  . [DISCONTINUED] celecoxib (CELEBREX) 200 MG capsule Take 1 capsule (200 mg total) by mouth daily. If needed for pain  . [DISCONTINUED] diltiazem (CARDIZEM CD) 120 MG 24 hr capsule Take 1 capsule (120 mg total) by mouth daily.  . [DISCONTINUED] pantoprazole (PROTONIX) 40 MG tablet TAKE 1 TABLET (40 MG TOTAL) BY MOUTH DAILY.    EXAM:  BP 124/78  Temp(Src) 98.4 F (36.9 C) (Oral)  Ht 5\' 4"  (1.626 m)  Wt 235 lb 4.8 oz (106.731 kg)  BMI 40.37 kg/m2  Body mass index is 40.37  kg/(m^2).  GENERAL: vitals reviewed and listed above, alert, oriented, appears well hydrated and in no acute distress HEENT: atraumatic, conjunctiva  clear, no obvious abnormalities on inspection of external nose and ears OP : no lesion edema or exudate  NECK: no obvious masses on inspection palpation  LUNGS: clear to auscultation bilaterally, no wheezes, rales or rhonchi, good air movement CV: HRRR, no clubbing cyanosis or  peripheral edema nl cap refill  MS: moves all extremities without noticeable focal  abnormality PSYCH: pleasant and cooperative, no obvious depression or anxiety Lab Results  Component Value Date   WBC 6.0 12/23/2013   HGB 11.9* 12/23/2013   HCT 37.9 12/23/2013   PLT 252.0 12/23/2013   GLUCOSE 95 12/23/2013   CHOL 171 12/23/2013   TRIG 95.0 12/23/2013   HDL 40.90 12/23/2013   LDLCALC 111* 12/23/2013   ALT 11 12/23/2013   AST 23 12/23/2013   NA 145 12/23/2013   K 4.9 12/23/2013   CL 106 12/23/2013   CREATININE 0.9 12/23/2013   BUN 11 12/23/2013   CO2 26 12/23/2013   TSH 2.86 12/23/2013   INR 1.16 01/16/2012   BP Readings from Last 3 Encounters:  08/29/14 124/78  02/13/14 146/82  12/30/13 138/90   Wt Readings from Last 3 Encounters:  08/29/14 235 lb 4.8 oz (106.731 kg)  02/13/14 259 lb (117.482 kg)  12/30/13 253 lb (114.76 kg)    ASSESSMENT AND PLAN:  Discussed the following assessment and plan:  LV hypertrophy, hypertensive, without heart failure - sdems controlleda t this time  Chronic lymphocytic thyroiditis  Does not have health insurance Pt changing bp meds   But want her to stay on the combo at this time and get bmp in the next 4-6 months hopefully will have insurance but then . warned of the limited  enrollment  Period or get penalty  Anyway  Look again into coverage  Refill med plan PV and labs in 5-6 months  -Patient advised to return or notify health care team  if symptoms worsen ,persist or new concerns arise.  Patient Instructions  Stay  on the  combo pill .  Can refill diuretic combo .  Seems to be best for control . Will need  Lab tests done in the next 4-6 months .  To check potassium thyroid etc .  Look in to [facility] in [place] . For cost  savings .with no insurance coverage.   Preventive  Visit with labs in 5-6 months        Neta MendsWanda K. Jonalyn Sedlak M.D.

## 2014-09-08 ENCOUNTER — Other Ambulatory Visit: Payer: Self-pay | Admitting: Internal Medicine

## 2014-09-08 NOTE — Telephone Encounter (Signed)
Sent to the pharmacy by e-scribe. 

## 2014-10-19 ENCOUNTER — Encounter (HOSPITAL_COMMUNITY): Payer: Self-pay | Admitting: Cardiology

## 2015-01-08 ENCOUNTER — Other Ambulatory Visit: Payer: Self-pay | Admitting: Internal Medicine

## 2015-01-08 NOTE — Telephone Encounter (Signed)
Sent to the pharmacy by e-scribe. Pt has upcoming CPX on 02/13/15

## 2015-02-06 ENCOUNTER — Other Ambulatory Visit: Payer: Self-pay

## 2015-02-13 ENCOUNTER — Encounter: Payer: Self-pay | Admitting: Internal Medicine

## 2015-03-04 ENCOUNTER — Telehealth: Payer: Self-pay | Admitting: Internal Medicine

## 2015-03-05 ENCOUNTER — Other Ambulatory Visit: Payer: Self-pay | Admitting: Family Medicine

## 2015-03-05 ENCOUNTER — Telehealth: Payer: Self-pay | Admitting: Family Medicine

## 2015-03-05 DIAGNOSIS — Z0000 Encounter for general adult medical examination without abnormal findings: Secondary | ICD-10-CM

## 2015-03-05 NOTE — Telephone Encounter (Signed)
Pt is not working and does not have insurance. Pt will callback to sch once she gets a job

## 2015-03-05 NOTE — Telephone Encounter (Signed)
Sent to the pharmacy by e-scribe.  Pt is past due for CPX.  Will send a message to scheduling to help her get an appt.

## 2015-03-05 NOTE — Telephone Encounter (Signed)
Noted  

## 2015-03-05 NOTE — Telephone Encounter (Signed)
Pt is past due for her yearly exam and lab work. Please help the pt to make both appointments.  Orders for lab work has been placed in the system.

## 2015-03-05 NOTE — Telephone Encounter (Signed)
Pt read previous phone note

## 2015-03-12 ENCOUNTER — Other Ambulatory Visit: Payer: Self-pay | Admitting: Internal Medicine

## 2015-03-13 NOTE — Telephone Encounter (Signed)
Pt currently not working and cannot come in.  Please advise.  Thanks!

## 2015-03-14 NOTE — Telephone Encounter (Signed)
Ok to refill x 6 months 

## 2015-03-15 NOTE — Telephone Encounter (Signed)
Sent to the pharmacy by e-scribe. 

## 2015-06-05 ENCOUNTER — Other Ambulatory Visit: Payer: Self-pay | Admitting: Internal Medicine

## 2015-06-05 NOTE — Telephone Encounter (Signed)
Medication sent to the pharmacy by e-scribe 90 days.  Pt notified she will need an office visit and she has made a future appt.  She is unsure about lab work.  Wanted to know how much it would cost.  Informed her that I will send a message (staff) to the office manager to see if there is a way for [place] to find out.  Will wait on that response.

## 2015-06-05 NOTE — Telephone Encounter (Signed)
Spoke to the pt.  She currently does not have insurance.  Please advise.  Thanks!

## 2015-06-05 NOTE — Telephone Encounter (Signed)
Still needs safe monitoring   Needs potassium and creatinine at minimum ( vs cost of BMP)  Can rx for 90 days and then still needs an ov of some sort .  Is she going to enroll on the  Healthcare exchanges ? (   Preventive visits should be fully covered)

## 2015-06-08 ENCOUNTER — Telehealth: Payer: Self-pay | Admitting: Family Medicine

## 2015-06-08 NOTE — Telephone Encounter (Signed)
Left a message for a return call.

## 2015-06-08 NOTE — Telephone Encounter (Signed)
-----   Message from Drusilla Kanner sent at 06/06/2015  9:04 AM EDT ----- Self-pay for BMP test will be approximately $17.  Thanks, Tim Lair ----- Message -----    From: Nils Flack, CMA    Sent: 06/05/2015   4:51 PM      To: Drusilla Kanner  Is there a way to find out how much out of pocket expense a potassium and creatinine will cost.  Pt has no insurance and needs lab work.  Please advise.  Thanks!

## 2015-06-14 NOTE — Telephone Encounter (Signed)
Spoke to the pt and informed her that the lab work will be $17.  She agreed to proceed and has made a future lab appt before her visit.

## 2015-07-05 ENCOUNTER — Other Ambulatory Visit (INDEPENDENT_AMBULATORY_CARE_PROVIDER_SITE_OTHER): Payer: Self-pay

## 2015-07-05 DIAGNOSIS — Z0000 Encounter for general adult medical examination without abnormal findings: Secondary | ICD-10-CM

## 2015-07-05 LAB — CBC WITH DIFFERENTIAL/PLATELET
BASOS PCT: 0.6 % (ref 0.0–3.0)
Basophils Absolute: 0 10*3/uL (ref 0.0–0.1)
Eosinophils Absolute: 0.4 10*3/uL (ref 0.0–0.7)
Eosinophils Relative: 5.8 % — ABNORMAL HIGH (ref 0.0–5.0)
HEMATOCRIT: 38.3 % (ref 36.0–46.0)
HEMOGLOBIN: 12.7 g/dL (ref 12.0–15.0)
LYMPHS PCT: 34.3 % (ref 12.0–46.0)
Lymphs Abs: 2.2 10*3/uL (ref 0.7–4.0)
MCHC: 33.1 g/dL (ref 30.0–36.0)
MCV: 89.5 fl (ref 78.0–100.0)
MONOS PCT: 6.9 % (ref 3.0–12.0)
Monocytes Absolute: 0.5 10*3/uL (ref 0.1–1.0)
Neutro Abs: 3.4 10*3/uL (ref 1.4–7.7)
Neutrophils Relative %: 52.4 % (ref 43.0–77.0)
Platelets: 250 10*3/uL (ref 150.0–400.0)
RBC: 4.28 Mil/uL (ref 3.87–5.11)
RDW: 13.8 % (ref 11.5–15.5)
WBC: 6.5 10*3/uL (ref 4.0–10.5)

## 2015-07-05 LAB — HEPATIC FUNCTION PANEL
ALT: 6 U/L (ref 0–35)
AST: 16 U/L (ref 0–37)
Albumin: 3.8 g/dL (ref 3.5–5.2)
Alkaline Phosphatase: 68 U/L (ref 39–117)
BILIRUBIN TOTAL: 0.8 mg/dL (ref 0.2–1.2)
Bilirubin, Direct: 0.1 mg/dL (ref 0.0–0.3)
Total Protein: 7.1 g/dL (ref 6.0–8.3)

## 2015-07-05 LAB — LIPID PANEL
CHOL/HDL RATIO: 5
CHOLESTEROL: 183 mg/dL (ref 0–200)
HDL: 38.4 mg/dL — ABNORMAL LOW (ref >39.00)
LDL CALC: 115 mg/dL — AB (ref 0–99)
NonHDL: 144.7
Triglycerides: 151 mg/dL — ABNORMAL HIGH (ref 0.0–149.0)
VLDL: 30.2 mg/dL (ref 0.0–40.0)

## 2015-07-05 LAB — BASIC METABOLIC PANEL
BUN: 32 mg/dL — ABNORMAL HIGH (ref 6–23)
CHLORIDE: 104 meq/L (ref 96–112)
CO2: 28 meq/L (ref 19–32)
CREATININE: 1.36 mg/dL — AB (ref 0.40–1.20)
Calcium: 9.4 mg/dL (ref 8.4–10.5)
GFR: 41.85 mL/min — ABNORMAL LOW (ref >60.00)
Glucose, Bld: 102 mg/dL — ABNORMAL HIGH (ref 70–99)
POTASSIUM: 4.8 meq/L (ref 3.5–5.1)
SODIUM: 138 meq/L (ref 135–145)

## 2015-07-05 LAB — TSH: TSH: 3.48 u[IU]/mL (ref 0.35–4.50)

## 2015-07-09 ENCOUNTER — Encounter: Payer: Self-pay | Admitting: Internal Medicine

## 2015-07-09 ENCOUNTER — Ambulatory Visit (INDEPENDENT_AMBULATORY_CARE_PROVIDER_SITE_OTHER): Payer: Self-pay | Admitting: Internal Medicine

## 2015-07-09 VITALS — BP 106/70 | Temp 98.6°F | Wt 238.0 lb

## 2015-07-09 DIAGNOSIS — M5431 Sciatica, right side: Secondary | ICD-10-CM

## 2015-07-09 DIAGNOSIS — Z2821 Immunization not carried out because of patient refusal: Secondary | ICD-10-CM

## 2015-07-09 DIAGNOSIS — F325 Major depressive disorder, single episode, in full remission: Secondary | ICD-10-CM

## 2015-07-09 DIAGNOSIS — Z598 Other problems related to housing and economic circumstances: Secondary | ICD-10-CM

## 2015-07-09 DIAGNOSIS — Z5989 Other problems related to housing and economic circumstances: Secondary | ICD-10-CM

## 2015-07-09 DIAGNOSIS — E063 Autoimmune thyroiditis: Secondary | ICD-10-CM

## 2015-07-09 DIAGNOSIS — Z79899 Other long term (current) drug therapy: Secondary | ICD-10-CM

## 2015-07-09 DIAGNOSIS — E782 Mixed hyperlipidemia: Secondary | ICD-10-CM

## 2015-07-09 DIAGNOSIS — E785 Hyperlipidemia, unspecified: Secondary | ICD-10-CM

## 2015-07-09 DIAGNOSIS — I119 Hypertensive heart disease without heart failure: Secondary | ICD-10-CM

## 2015-07-09 NOTE — Patient Instructions (Signed)
Blood pressure is good today.  Stop the aleve product and  minimize in future   Because of  Evidence of decrease kidney function.   Look into [place] program.  Weight loss and exercise helpful for the cholesterol  Elevation     Recheck creatinine and bun  In  3-4 weeks off  Aleve   ( lab only ) then yearly follow up depending on results   Sciatica with Rehab The sciatic nerve runs from the back down the leg and is responsible for sensation and control of the muscles in the back (posterior) side of the thigh, lower leg, and foot. Sciatica is a condition that is characterized by inflammation of this nerve.  SYMPTOMS   Signs of nerve damage, including numbness and/or weakness along the posterior side of the lower extremity.  Pain in the back of the thigh that may also travel down the leg.  Pain that worsens when sitting for long periods of time.  Occasionally, pain in the back or buttock. CAUSES  Inflammation of the sciatic nerve is the cause of sciatica. The inflammation is due to something irritating the nerve. Common sources of irritation include:  Sitting for long periods of time.  Direct trauma to the nerve.  Arthritis of the spine.  Herniated or ruptured disk.  Slipping of the vertebrae (spondylolisthesis).  Pressure from soft tissues, such as muscles or ligament-like tissue (fascia). RISK INCREASES WITH:  Sports that place pressure or stress on the spine (football or weightlifting).  Poor strength and flexibility.  Failure to warm up properly before activity.  Family history of low back pain or disk disorders.  Previous back injury or surgery.  Poor body mechanics, especially when lifting, or poor posture. PREVENTION   Warm up and stretch properly before activity.  Maintain physical fitness:  Strength, flexibility, and endurance.  Cardiovascular fitness.  Learn and use proper technique, especially with posture and lifting. When possible, have coach  correct improper technique.  Avoid activities that place stress on the spine. PROGNOSIS If treated properly, then sciatica usually resolves within 6 weeks. However, occasionally surgery is necessary.  RELATED COMPLICATIONS   Permanent nerve damage, including pain, numbness, tingle, or weakness.  Chronic back pain.  Risks of surgery: infection, bleeding, nerve damage, or damage to surrounding tissues. TREATMENT Treatment initially involves resting from any activities that aggravate your symptoms. The use of ice and medication may help reduce pain and inflammation. The use of strengthening and stretching exercises may help reduce pain with activity. These exercises may be performed at home or with referral to a therapist. A therapist may recommend further treatments, such as transcutaneous electronic nerve stimulation (TENS) or ultrasound. Your caregiver may recommend corticosteroid injections to help reduce inflammation of the sciatic nerve. If symptoms persist despite non-surgical (conservative) treatment, then surgery may be recommended. MEDICATION  If pain medication is necessary, then nonsteroidal anti-inflammatory medications, such as aspirin and ibuprofen, or other minor pain relievers, such as acetaminophen, are often recommended.  Do not take pain medication for 7 days before surgery.  Prescription pain relievers may be given if deemed necessary by your caregiver. Use only as directed and only as much as you need.  Ointments applied to the skin may be helpful.  Corticosteroid injections may be given by your caregiver. These injections should be reserved for the most serious cases, because they may only be given a certain number of times. HEAT AND COLD  Cold treatment (icing) relieves pain and reduces inflammation. Cold treatment  should be applied for 10 to 15 minutes every 2 to 3 hours for inflammation and pain and immediately after any activity that aggravates your symptoms. Use  ice packs or massage the area with a piece of ice (ice massage).  Heat treatment may be used prior to performing the stretching and strengthening activities prescribed by your caregiver, physical therapist, or athletic trainer. Use a heat pack or soak the injury in warm water. SEEK MEDICAL CARE IF:  Treatment seems to offer no benefit, or the condition worsens.  Any medications produce adverse side effects. EXERCISES  RANGE OF MOTION (ROM) AND STRETCHING EXERCISES - Sciatica Most people with sciatic will find that their symptoms worsen with either excessive bending forward (flexion) or arching at the low back (extension). The exercises which will help resolve your symptoms will focus on the opposite motion. Your physician, physical therapist or athletic trainer will help you determine which exercises will be most helpful to resolve your low back pain. Do not complete any exercises without first consulting with your clinician. Discontinue any exercises which worsen your symptoms until you speak to your clinician. If you have pain, numbness or tingling which travels down into your buttocks, leg or foot, the goal of the therapy is for these symptoms to move closer to your back and eventually resolve. Occasionally, these leg symptoms will get better, but your low back pain may worsen; this is typically an indication of progress in your rehabilitation. Be certain to be very alert to any changes in your symptoms and the activities in which you participated in the 24 hours prior to the change. Sharing this information with your clinician will allow him/her to most efficiently treat your condition. These exercises may help you when beginning to rehabilitate your injury. Your symptoms may resolve with or without further involvement from your physician, physical therapist or athletic trainer. While completing these exercises, remember:   Restoring tissue flexibility helps normal motion to return to the joints.  This allows healthier, less painful movement and activity.  An effective stretch should be held for at least 30 seconds.  A stretch should never be painful. You should only feel a gentle lengthening or release in the stretched tissue. FLEXION RANGE OF MOTION AND STRETCHING EXERCISES: STRETCH - Flexion, Single Knee to Chest   Lie on a firm bed or floor with both legs extended in front of you.  Keeping one leg in contact with the floor, bring your opposite knee to your chest. Hold your leg in place by either grabbing behind your thigh or at your knee.  Pull until you feel a gentle stretch in your low back. Hold __________ seconds.  Slowly release your grasp and repeat the exercise with the opposite side. Repeat __________ times. Complete this exercise __________ times per day.  STRETCH - Flexion, Double Knee to Chest  Lie on a firm bed or floor with both legs extended in front of you.  Keeping one leg in contact with the floor, bring your opposite knee to your chest.  Tense your stomach muscles to support your back and then lift your other knee to your chest. Hold your legs in place by either grabbing behind your thighs or at your knees.  Pull both knees toward your chest until you feel a gentle stretch in your low back. Hold __________ seconds.  Tense your stomach muscles and slowly return one leg at a time to the floor. Repeat __________ times. Complete this exercise __________ times per day.  STRETCH -  Low Trunk Rotation   Lie on a firm bed or floor. Keeping your legs in front of you, bend your knees so they are both pointed toward the ceiling and your feet are flat on the floor.  Extend your arms out to the side. This will stabilize your upper body by keeping your shoulders in contact with the floor.  Gently and slowly drop both knees together to one side until you feel a gentle stretch in your low back. Hold for __________ seconds.  Tense your stomach muscles to support your  low back as you bring your knees back to the starting position. Repeat the exercise to the other side. Repeat __________ times. Complete this exercise __________ times per day  EXTENSION RANGE OF MOTION AND FLEXIBILITY EXERCISES: STRETCH - Extension, Prone on Elbows  Lie on your stomach on the floor, a bed will be too soft. Place your palms about shoulder width apart and at the height of your head.  Place your elbows under your shoulders. If this is too painful, stack pillows under your chest.  Allow your body to relax so that your hips drop lower and make contact more completely with the floor.  Hold this position for __________ seconds.  Slowly return to lying flat on the floor. Repeat __________ times. Complete this exercise __________ times per day.  RANGE OF MOTION - Extension, Prone Press Ups  Lie on your stomach on the floor, a bed will be too soft. Place your palms about shoulder width apart and at the height of your head.  Keeping your back as relaxed as possible, slowly straighten your elbows while keeping your hips on the floor. You may adjust the placement of your hands to maximize your comfort. As you gain motion, your hands will come more underneath your shoulders.  Hold this position __________ seconds.  Slowly return to lying flat on the floor. Repeat __________ times. Complete this exercise __________ times per day.  STRENGTHENING EXERCISES - Sciatica  These exercises may help you when beginning to rehabilitate your injury. These exercises should be done near your "sweet spot." This is the neutral, low-back arch, somewhere between fully rounded and fully arched, that is your least painful position. When performed in this safe range of motion, these exercises can be used for people who have either a flexion or extension based injury. These exercises may resolve your symptoms with or without further involvement from your physician, physical therapist or athletic trainer.  While completing these exercises, remember:   Muscles can gain both the endurance and the strength needed for everyday activities through controlled exercises.  Complete these exercises as instructed by your physician, physical therapist or athletic trainer. Progress with the resistance and repetition exercises only as your caregiver advises.  You may experience muscle soreness or fatigue, but the pain or discomfort you are trying to eliminate should never worsen during these exercises. If this pain does worsen, stop and make certain you are following the directions exactly. If the pain is still present after adjustments, discontinue the exercise until you can discuss the trouble with your clinician. STRENGTHENING - Deep Abdominals, Pelvic Tilt   Lie on a firm bed or floor. Keeping your legs in front of you, bend your knees so they are both pointed toward the ceiling and your feet are flat on the floor.  Tense your lower abdominal muscles to press your low back into the floor. This motion will rotate your pelvis so that your tail bone is scooping upwards  rather than pointing at your feet or into the floor.  With a gentle tension and even breathing, hold this position for __________ seconds. Repeat __________ times. Complete this exercise __________ times per day.  STRENGTHENING - Abdominals, Crunches   Lie on a firm bed or floor. Keeping your legs in front of you, bend your knees so they are both pointed toward the ceiling and your feet are flat on the floor. Cross your arms over your chest.  Slightly tip your chin down without bending your neck.  Tense your abdominals and slowly lift your trunk high enough to just clear your shoulder blades. Lifting higher can put excessive stress on the low back and does not further strengthen your abdominal muscles.  Control your return to the starting position. Repeat __________ times. Complete this exercise __________ times per day.  STRENGTHENING -  Quadruped, Opposite UE/LE Lift  Assume a hands and knees position on a firm surface. Keep your hands under your shoulders and your knees under your hips. You may place padding under your knees for comfort.  Find your neutral spine and gently tense your abdominal muscles so that you can maintain this position. Your shoulders and hips should form a rectangle that is parallel with the floor and is not twisted.  Keeping your trunk steady, lift your right hand no higher than your shoulder and then your left leg no higher than your hip. Make sure you are not holding your breath. Hold this position __________ seconds.  Continuing to keep your abdominal muscles tense and your back steady, slowly return to your starting position. Repeat with the opposite arm and leg. Repeat __________ times. Complete this exercise __________ times per day.  STRENGTHENING - Abdominals and Quadriceps, Straight Leg Raise   Lie on a firm bed or floor with both legs extended in front of you.  Keeping one leg in contact with the floor, bend the other knee so that your foot can rest flat on the floor.  Find your neutral spine, and tense your abdominal muscles to maintain your spinal position throughout the exercise.  Slowly lift your straight leg off the floor about 6 inches for a count of 15, making sure to not hold your breath.  Still keeping your neutral spine, slowly lower your leg all the way to the floor. Repeat this exercise with each leg __________ times. Complete this exercise __________ times per day. POSTURE AND BODY MECHANICS CONSIDERATIONS - Sciatica Keeping correct posture when sitting, standing or completing your activities will reduce the stress put on different body tissues, allowing injured tissues a chance to heal and limiting painful experiences. The following are general guidelines for improved posture. Your physician or physical therapist will provide you with any instructions specific to your needs.  While reading these guidelines, remember:  The exercises prescribed by your provider will help you have the flexibility and strength to maintain correct postures.  The correct posture provides the optimal environment for your joints to work. All of your joints have less wear and tear when properly supported by a spine with good posture. This means you will experience a healthier, less painful body.  Correct posture must be practiced with all of your activities, especially prolonged sitting and standing. Correct posture is as important when doing repetitive low-stress activities (typing) as it is when doing a single heavy-load activity (lifting). RESTING POSITIONS Consider which positions are most painful for you when choosing a resting position. If you have pain with flexion-based activities (sitting, bending,  stooping, squatting), choose a position that allows you to rest in a less flexed posture. You would want to avoid curling into a fetal position on your side. If your pain worsens with extension-based activities (prolonged standing, working overhead), avoid resting in an extended position such as sleeping on your stomach. Most people will find more comfort when they rest with their spine in a more neutral position, neither too rounded nor too arched. Lying on a non-sagging bed on your side with a pillow between your knees, or on your back with a pillow under your knees will often provide some relief. Keep in mind, being in any one position for a prolonged period of time, no matter how correct your posture, can still lead to stiffness. PROPER SITTING POSTURE In order to minimize stress and discomfort on your spine, you must sit with correct posture Sitting with good posture should be effortless for a healthy body. Returning to good posture is a gradual process. Many people can work toward this most comfortably by using various supports until they have the flexibility and strength to maintain this  posture on their own. When sitting with proper posture, your ears will fall over your shoulders and your shoulders will fall over your hips. You should use the back of the chair to support your upper back. Your low back will be in a neutral position, just slightly arched. You may place a small pillow or folded towel at the base of your low back for support.  When working at a desk, create an environment that supports good, upright posture. Without extra support, muscles fatigue and lead to excessive strain on joints and other tissues. Keep these recommendations in mind: CHAIR:   A chair should be able to slide under your desk when your back makes contact with the back of the chair. This allows you to work closely.  The chair's height should allow your eyes to be level with the upper part of your monitor and your hands to be slightly lower than your elbows. BODY POSITION  Your feet should make contact with the floor. If this is not possible, use a foot rest.  Keep your ears over your shoulders. This will reduce stress on your neck and low back. INCORRECT SITTING POSTURES   If you are feeling tired and unable to assume a healthy sitting posture, do not slouch or slump. This puts excessive strain on your back tissues, causing more damage and pain. Healthier options include:  Using more support, like a lumbar pillow.  Switching tasks to something that requires you to be upright or walking.  Talking a brief walk.  Lying down to rest in a neutral-spine position. PROLONGED STANDING WHILE SLIGHTLY LEANING FORWARD  When completing a task that requires you to lean forward while standing in one place for a long time, place either foot up on a stationary 2-4 inch high object to help maintain the best posture. When both feet are on the ground, the low back tends to lose its slight inward curve. If this curve flattens (or becomes too large), then the back and your other joints will experience too much  stress, fatigue more quickly and can cause pain.  CORRECT STANDING POSTURES Proper standing posture should be assumed with all daily activities, even if they only take a few moments, like when brushing your teeth. As in sitting, your ears should fall over your shoulders and your shoulders should fall over your hips. You should keep a slight tension in your  abdominal muscles to brace your spine. Your tailbone should point down to the ground, not behind your body, resulting in an over-extended swayback posture.  INCORRECT STANDING POSTURES  Common incorrect standing postures include a forward head, locked knees and/or an excessive swayback. WALKING Walk with an upright posture. Your ears, shoulders and hips should all line-up. PROLONGED ACTIVITY IN A FLEXED POSITION When completing a task that requires you to bend forward at your waist or lean over a low surface, try to find a way to stabilize 3 of 4 of your limbs. You can place a hand or elbow on your thigh or rest a knee on the surface you are reaching across. This will provide you more stability so that your muscles do not fatigue as quickly. By keeping your knees relaxed, or slightly bent, you will also reduce stress across your low back. CORRECT LIFTING TECHNIQUES DO :   Assume a wide stance. This will provide you more stability and the opportunity to get as close as possible to the object which you are lifting.  Tense your abdominals to brace your spine; then bend at the knees and hips. Keeping your back locked in a neutral-spine position, lift using your leg muscles. Lift with your legs, keeping your back straight.  Test the weight of unknown objects before attempting to lift them.  Try to keep your elbows locked down at your sides in order get the best strength from your shoulders when carrying an object.  Always ask for help when lifting heavy or awkward objects. INCORRECT LIFTING TECHNIQUES DO NOT:   Lock your knees when lifting, even  if it is a small object.  Bend and twist. Pivot at your feet or move your feet when needing to change directions.  Assume that you cannot safely pick up a paperclip without proper posture. Document Released: 10/27/2005 Document Revised: 03/13/2014 Document Reviewed: 02/08/2009 [facility] Patient Information 2015 [place], [place]. This information is not intended to replace advice given to you by your health care provider. Make sure you discuss any questions you have with your health care provider.

## 2015-07-09 NOTE — Progress Notes (Signed)
Chief Complaint  Patient presents with  . Follow-up    HPI: Shelby Wong 64 y.o.  comes in for chronic disease/ medication management  Last seen October 2015  Has no insurance and thus limiting visits etc  Has hypertensive  Heart disease but  Feels fine had episode of yocarditis  In 2013 and non obs heart disease she states that her blood pressure has been good denies any side effects of medication. Thyroid disease on replacemenmts She is been battling sciatica pain in her back with radiation and no weakness off and on. Taking 2 Aleve at night. That helps her be tolerable. She continues to take Nexium and fluoxetine with help for GI and mood improvement. ROS: See pertinent positives and negatives per HPI. No current chest pain shortness of breath syncope denies specific depression symptoms. Unusual coughing syncope. No change in bowel habits blood in her stool.  Past Medical History  Diagnosis Date  . Allergic rhinitis   . Depression   . GERD (gastroesophageal reflux disease)   . Hiatal hernia   . Diverticulosis of colon (without mention of hemorrhage)   . Hypothyroidism   . IBS (irritable bowel syndrome)   . Anxiety   . Hemorrhoids   . Obesity   . Transaminitis     Abd [place] 12/2011 - hepatic steatosis  . Pulmonary HTN     Enlarged pulmonary artery by CT angio 12/2011 - PAP by echo 12/2011  . Myocarditis     a. NSTEMI:  12/2011 with peak troponin 2. Cath - mild nonobstructive plaque.;  b. nstemi 01/2012, relook cath - nonobs - MRI sugg of myocarditis  . Elevated LFTs   . Shingles 07/09/2012  . POLYP, ANAL AND RECTAL 09/07/2007    Qualifier: Diagnosis of  By: Lawernce Ion, CMA (AAMA), Bethann Berkshire   . Hx of echocardiogram     a. Echo 3/14:  Mild LVH, EF 55-65%, normal wall motion, PASP 33    Family History  Problem Relation Age of Onset  . Arthritis Mother     osteo  . Breast cancer Mother   . Coronary artery disease Mother   . Other Mother     hearing loss  . Diabetes Brother      50 s  . Thyroid disease Mother   . Colon cancer Neg Hx     Social History   Social History  . Marital Status: Divorced    Spouse Name: N/A  . Number of Children: 1  . Years of Education: N/A   Occupational History  . Direct Sales Rep    Social History Main Topics  . Smoking status: Former Smoker    Quit date: 09/11/2006  . Smokeless tobacco: Never Used  . Alcohol Use: Yes     Comment: occassional  . Drug Use: No  . Sexual Activity: Not Currently    Birth Control/ Protection: Post-menopausal   Other Topics Concern  . 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 Cholecalciferol (VITAMIN D) 400 UNITS capsule Take 400 Units by mouth daily.      Marland Kitchen  Esomeprazole Magnesium (NEXIUM PO) Take by mouth.    Marland Kitchen FLUoxetine (PROZAC) 10 MG capsule TAKE ONE CAPSULE BY MOUTH ONCE DAILY 30 capsule 5  . levothyroxine (SYNTHROID, LEVOTHROID) 100 MCG tablet TAKE ONE TABLET BY MOUTH ONCE DAILY 90 tablet 0  . lisinopril-hydrochlorothiazide (PRINZIDE,ZESTORETIC) 10-12.5 MG per tablet TAKE ONE TABLET BY MOUTH ONCE DAILY 90 tablet 0  . Loratadine (CLARITIN) 10 MG CAPS Take 10 mg by mouth daily as needed. As needed for sinus     No facility-administered medications prior to visit.     EXAM:  BP 106/70 mmHg  Temp(Src) 98.6 F (37 C) (Oral)  Wt 238 lb (107.956 kg)  Body mass index is 40.83 kg/(m^2).  GENERAL: vitals reviewed and listed above, alert, oriented, appears well hydrated and in no acute distress HEENT: atraumatic, conjunctiva  clear, no obvious abnormalities on inspection of external nose and ears OP : no lesion edema or exudate  NECK: no obvious masses on  inspection palpation  LUNGS: clear to auscultation bilaterally, no wheezes, rales or rhonchi, good air movement CV: HRRR, no clubbing cyanosis or  peripheral edema nl cap refill  Abdomen soft without organomegaly guarding or rebound. MS: moves all extremities without noticeable focal  abnormality back no midpoint tenderness negative SLR DTRs are present gait mildly antalgic PSYCH: pleasant and cooperative, no obvious depression or anxiety Lab Results  Component Value Date   WBC 6.5 07/05/2015   HGB 12.7 07/05/2015   HCT 38.3 07/05/2015   PLT 250.0 07/05/2015   GLUCOSE 102* 07/05/2015   CHOL 183 07/05/2015   TRIG 151.0* 07/05/2015   HDL 38.40* 07/05/2015   LDLCALC 115* 07/05/2015   ALT 6 07/05/2015   AST 16 07/05/2015   NA 138 07/05/2015   K 4.8 07/05/2015   CL 104 07/05/2015   CREATININE 1.36* 07/05/2015   BUN 32* 07/05/2015   CO2 28 07/05/2015   TSH 3.48 07/05/2015   INR 1.16 01/16/2012   BP Readings from Last 3 Encounters:  07/09/15 106/70  08/29/14 124/78  02/13/14 146/82   Wt Readings from Last 3 Encounters:  07/09/15 238 lb (107.956 kg)  08/29/14 235 lb 4.8 oz (106.731 kg)  02/13/14 259 lb (117.482 kg)    ASSESSMENT AND PLAN:  Discussed the following assessment and plan:  LV hypertrophy, hypertensive, without heart failure  Chronic lymphocytic thyroiditis  HYPERLIPIDEMIA - lsi discussed   Medication management  Sciatica, right  Major depression, single episode, in complete remission - continue on meds   Does not have health insurance  Influenza vaccination declined  Dyslipidemia - low hdl elvated tg  dsisc lsi importance Elevated creatinine  Needs repeat  Fu  And limiting  nsaids  -Patient advised to return or notify health care team  if symptoms worsen ,persist or new concerns arise.  Patient Instructions  Blood pressure is good today.  Stop the aleve product and  minimize in future   Because of  Evidence of decrease kidney function.   Look  into [facility] program.  Weight loss and exercise helpful for the cholesterol  Elevation     Recheck creatinine and bun  In  3-4 weeks off  Aleve   ( lab only ) then yearly follow up depending on results   Sciatica with Rehab The sciatic nerve runs from the back down the leg and is responsible for sensation and control of the muscles in the back (posterior) side of the thigh, lower leg, and foot. Sciatica is a condition that is characterized  by inflammation of this nerve.  SYMPTOMS   Signs of nerve damage, including numbness and/or weakness along the posterior side of the lower extremity.  Pain in the back of the thigh that may also travel down the leg.  Pain that worsens when sitting for long periods of time.  Occasionally, pain in the back or buttock. CAUSES  Inflammation of the sciatic nerve is the cause of sciatica. The inflammation is due to something irritating the nerve. Common sources of irritation include:  Sitting for long periods of time.  Direct trauma to the nerve.  Arthritis of the spine.  Herniated or ruptured disk.  Slipping of the vertebrae (spondylolisthesis).  Pressure from soft tissues, such as muscles or ligament-like tissue (fascia). RISK INCREASES WITH:  Sports that place pressure or stress on the spine (football or weightlifting).  Poor strength and flexibility.  Failure to warm up properly before activity.  Family history of low back pain or disk disorders.  Previous back injury or surgery.  Poor body mechanics, especially when lifting, or poor posture. PREVENTION   Warm up and stretch properly before activity.  Maintain physical fitness:  Strength, flexibility, and endurance.  Cardiovascular fitness.  Learn and use proper technique, especially with posture and lifting. When possible, have coach correct improper technique.  Avoid activities that place stress on the spine. PROGNOSIS If treated properly, then sciatica usually  resolves within 6 weeks. However, occasionally surgery is necessary.  RELATED COMPLICATIONS   Permanent nerve damage, including pain, numbness, tingle, or weakness.  Chronic back pain.  Risks of surgery: infection, bleeding, nerve damage, or damage to surrounding tissues. TREATMENT Treatment initially involves resting from any activities that aggravate your symptoms. The use of ice and medication may help reduce pain and inflammation. The use of strengthening and stretching exercises may help reduce pain with activity. These exercises may be performed at home or with referral to a therapist. A therapist may recommend further treatments, such as transcutaneous electronic nerve stimulation (TENS) or ultrasound. Your caregiver may recommend corticosteroid injections to help reduce inflammation of the sciatic nerve. If symptoms persist despite non-surgical (conservative) treatment, then surgery may be recommended. MEDICATION  If pain medication is necessary, then nonsteroidal anti-inflammatory medications, such as aspirin and ibuprofen, or other minor pain relievers, such as acetaminophen, are often recommended.  Do not take pain medication for 7 days before surgery.  Prescription pain relievers may be given if deemed necessary by your caregiver. Use only as directed and only as much as you need.  Ointments applied to the skin may be helpful.  Corticosteroid injections may be given by your caregiver. These injections should be reserved for the most serious cases, because they may only be given a certain number of times. HEAT AND COLD  Cold treatment (icing) relieves pain and reduces inflammation. Cold treatment should be applied for 10 to 15 minutes every 2 to 3 hours for inflammation and pain and immediately after any activity that aggravates your symptoms. Use ice packs or massage the area with a piece of ice (ice massage).  Heat treatment may be used prior to performing the stretching and  strengthening activities prescribed by your caregiver, physical therapist, or athletic trainer. Use a heat pack or soak the injury in warm water. SEEK MEDICAL CARE IF:  Treatment seems to offer no benefit, or the condition worsens.  Any medications produce adverse side effects. EXERCISES  RANGE OF MOTION (ROM) AND STRETCHING EXERCISES - Sciatica Most people with sciatic will find that their  symptoms worsen with either excessive bending forward (flexion) or arching at the low back (extension). The exercises which will help resolve your symptoms will focus on the opposite motion. Your physician, physical therapist or athletic trainer will help you determine which exercises will be most helpful to resolve your low back pain. Do not complete any exercises without first consulting with your clinician. Discontinue any exercises which worsen your symptoms until you speak to your clinician. If you have pain, numbness or tingling which travels down into your buttocks, leg or foot, the goal of the therapy is for these symptoms to move closer to your back and eventually resolve. Occasionally, these leg symptoms will get better, but your low back pain may worsen; this is typically an indication of progress in your rehabilitation. Be certain to be very alert to any changes in your symptoms and the activities in which you participated in the 24 hours prior to the change. Sharing this information with your clinician will allow him/her to most efficiently treat your condition. These exercises may help you when beginning to rehabilitate your injury. Your symptoms may resolve with or without further involvement from your physician, physical therapist or athletic trainer. While completing these exercises, remember:   Restoring tissue flexibility helps normal motion to return to the joints. This allows healthier, less painful movement and activity.  An effective stretch should be held for at least 30 seconds.  A stretch  should never be painful. You should only feel a gentle lengthening or release in the stretched tissue. FLEXION RANGE OF MOTION AND STRETCHING EXERCISES: STRETCH - Flexion, Single Knee to Chest   Lie on a firm bed or floor with both legs extended in front of you.  Keeping one leg in contact with the floor, bring your opposite knee to your chest. Hold your leg in place by either grabbing behind your thigh or at your knee.  Pull until you feel a gentle stretch in your low back. Hold __________ seconds.  Slowly release your grasp and repeat the exercise with the opposite side. Repeat __________ times. Complete this exercise __________ times per day.  STRETCH - Flexion, Double Knee to Chest  Lie on a firm bed or floor with both legs extended in front of you.  Keeping one leg in contact with the floor, bring your opposite knee to your chest.  Tense your stomach muscles to support your back and then lift your other knee to your chest. Hold your legs in place by either grabbing behind your thighs or at your knees.  Pull both knees toward your chest until you feel a gentle stretch in your low back. Hold __________ seconds.  Tense your stomach muscles and slowly return one leg at a time to the floor. Repeat __________ times. Complete this exercise __________ times per day.  STRETCH - Low Trunk Rotation   Lie on a firm bed or floor. Keeping your legs in front of you, bend your knees so they are both pointed toward the ceiling and your feet are flat on the floor.  Extend your arms out to the side. This will stabilize your upper body by keeping your shoulders in contact with the floor.  Gently and slowly drop both knees together to one side until you feel a gentle stretch in your low back. Hold for __________ seconds.  Tense your stomach muscles to support your low back as you bring your knees back to the starting position. Repeat the exercise to the other side. Repeat __________  times. Complete  this exercise __________ times per day  EXTENSION RANGE OF MOTION AND FLEXIBILITY EXERCISES: STRETCH - Extension, Prone on Elbows  Lie on your stomach on the floor, a bed will be too soft. Place your palms about shoulder width apart and at the height of your head.  Place your elbows under your shoulders. If this is too painful, stack pillows under your chest.  Allow your body to relax so that your hips drop lower and make contact more completely with the floor.  Hold this position for __________ seconds.  Slowly return to lying flat on the floor. Repeat __________ times. Complete this exercise __________ times per day.  RANGE OF MOTION - Extension, Prone Press Ups  Lie on your stomach on the floor, a bed will be too soft. Place your palms about shoulder width apart and at the height of your head.  Keeping your back as relaxed as possible, slowly straighten your elbows while keeping your hips on the floor. You may adjust the placement of your hands to maximize your comfort. As you gain motion, your hands will come more underneath your shoulders.  Hold this position __________ seconds.  Slowly return to lying flat on the floor. Repeat __________ times. Complete this exercise __________ times per day.  STRENGTHENING EXERCISES - Sciatica  These exercises may help you when beginning to rehabilitate your injury. These exercises should be done near your "sweet spot." This is the neutral, low-back arch, somewhere between fully rounded and fully arched, that is your least painful position. When performed in this safe range of motion, these exercises can be used for people who have either a flexion or extension based injury. These exercises may resolve your symptoms with or without further involvement from your physician, physical therapist or athletic trainer. While completing these exercises, remember:   Muscles can gain both the endurance and the strength needed for everyday activities through  controlled exercises.  Complete these exercises as instructed by your physician, physical therapist or athletic trainer. Progress with the resistance and repetition exercises only as your caregiver advises.  You may experience muscle soreness or fatigue, but the pain or discomfort you are trying to eliminate should never worsen during these exercises. If this pain does worsen, stop and make certain you are following the directions exactly. If the pain is still present after adjustments, discontinue the exercise until you can discuss the trouble with your clinician. STRENGTHENING - Deep Abdominals, Pelvic Tilt   Lie on a firm bed or floor. Keeping your legs in front of you, bend your knees so they are both pointed toward the ceiling and your feet are flat on the floor.  Tense your lower abdominal muscles to press your low back into the floor. This motion will rotate your pelvis so that your tail bone is scooping upwards rather than pointing at your feet or into the floor.  With a gentle tension and even breathing, hold this position for __________ seconds. Repeat __________ times. Complete this exercise __________ times per day.  STRENGTHENING - Abdominals, Crunches   Lie on a firm bed or floor. Keeping your legs in front of you, bend your knees so they are both pointed toward the ceiling and your feet are flat on the floor. Cross your arms over your chest.  Slightly tip your chin down without bending your neck.  Tense your abdominals and slowly lift your trunk high enough to just clear your shoulder blades. Lifting higher can put excessive stress on the  low back and does not further strengthen your abdominal muscles.  Control your return to the starting position. Repeat __________ times. Complete this exercise __________ times per day.  STRENGTHENING - Quadruped, Opposite UE/LE Lift  Assume a hands and knees position on a firm surface. Keep your hands under your shoulders and your knees  under your hips. You may place padding under your knees for comfort.  Find your neutral spine and gently tense your abdominal muscles so that you can maintain this position. Your shoulders and hips should form a rectangle that is parallel with the floor and is not twisted.  Keeping your trunk steady, lift your right hand no higher than your shoulder and then your left leg no higher than your hip. Make sure you are not holding your breath. Hold this position __________ seconds.  Continuing to keep your abdominal muscles tense and your back steady, slowly return to your starting position. Repeat with the opposite arm and leg. Repeat __________ times. Complete this exercise __________ times per day.  STRENGTHENING - Abdominals and Quadriceps, Straight Leg Raise   Lie on a firm bed or floor with both legs extended in front of you.  Keeping one leg in contact with the floor, bend the other knee so that your foot can rest flat on the floor.  Find your neutral spine, and tense your abdominal muscles to maintain your spinal position throughout the exercise.  Slowly lift your straight leg off the floor about 6 inches for a count of 15, making sure to not hold your breath.  Still keeping your neutral spine, slowly lower your leg all the way to the floor. Repeat this exercise with each leg __________ times. Complete this exercise __________ times per day. POSTURE AND BODY MECHANICS CONSIDERATIONS - Sciatica Keeping correct posture when sitting, standing or completing your activities will reduce the stress put on different body tissues, allowing injured tissues a chance to heal and limiting painful experiences. The following are general guidelines for improved posture. Your physician or physical therapist will provide you with any instructions specific to your needs. While reading these guidelines, remember:  The exercises prescribed by your provider will help you have the flexibility and strength to  maintain correct postures.  The correct posture provides the optimal environment for your joints to work. All of your joints have less wear and tear when properly supported by a spine with good posture. This means you will experience a healthier, less painful body.  Correct posture must be practiced with all of your activities, especially prolonged sitting and standing. Correct posture is as important when doing repetitive low-stress activities (typing) as it is when doing a single heavy-load activity (lifting). RESTING POSITIONS Consider which positions are most painful for you when choosing a resting position. If you have pain with flexion-based activities (sitting, bending, stooping, squatting), choose a position that allows you to rest in a less flexed posture. You would want to avoid curling into a fetal position on your side. If your pain worsens with extension-based activities (prolonged standing, working overhead), avoid resting in an extended position such as sleeping on your stomach. Most people will find more comfort when they rest with their spine in a more neutral position, neither too rounded nor too arched. Lying on a non-sagging bed on your side with a pillow between your knees, or on your back with a pillow under your knees will often provide some relief. Keep in mind, being in any one position for a prolonged period  of time, no matter how correct your posture, can still lead to stiffness. PROPER SITTING POSTURE In order to minimize stress and discomfort on your spine, you must sit with correct posture Sitting with good posture should be effortless for a healthy body. Returning to good posture is a gradual process. Many people can work toward this most comfortably by using various supports until they have the flexibility and strength to maintain this posture on their own. When sitting with proper posture, your ears will fall over your shoulders and your shoulders will fall over your hips.  You should use the back of the chair to support your upper back. Your low back will be in a neutral position, just slightly arched. You may place a small pillow or folded towel at the base of your low back for support.  When working at a desk, create an environment that supports good, upright posture. Without extra support, muscles fatigue and lead to excessive strain on joints and other tissues. Keep these recommendations in mind: CHAIR:   A chair should be able to slide under your desk when your back makes contact with the back of the chair. This allows you to work closely.  The chair's height should allow your eyes to be level with the upper part of your monitor and your hands to be slightly lower than your elbows. BODY POSITION  Your feet should make contact with the floor. If this is not possible, use a foot rest.  Keep your ears over your shoulders. This will reduce stress on your neck and low back. INCORRECT SITTING POSTURES   If you are feeling tired and unable to assume a healthy sitting posture, do not slouch or slump. This puts excessive strain on your back tissues, causing more damage and pain. Healthier options include:  Using more support, like a lumbar pillow.  Switching tasks to something that requires you to be upright or walking.  Talking a brief walk.  Lying down to rest in a neutral-spine position. PROLONGED STANDING WHILE SLIGHTLY LEANING FORWARD  When completing a task that requires you to lean forward while standing in one place for a long time, place either foot up on a stationary 2-4 inch high object to help maintain the best posture. When both feet are on the ground, the low back tends to lose its slight inward curve. If this curve flattens (or becomes too large), then the back and your other joints will experience too much stress, fatigue more quickly and can cause pain.  CORRECT STANDING POSTURES Proper standing posture should be assumed with all daily  activities, even if they only take a few moments, like when brushing your teeth. As in sitting, your ears should fall over your shoulders and your shoulders should fall over your hips. You should keep a slight tension in your abdominal muscles to brace your spine. Your tailbone should point down to the ground, not behind your body, resulting in an over-extended swayback posture.  INCORRECT STANDING POSTURES  Common incorrect standing postures include a forward head, locked knees and/or an excessive swayback. WALKING Walk with an upright posture. Your ears, shoulders and hips should all line-up. PROLONGED ACTIVITY IN A FLEXED POSITION When completing a task that requires you to bend forward at your waist or lean over a low surface, try to find a way to stabilize 3 of 4 of your limbs. You can place a hand or elbow on your thigh or rest a knee on the surface you are reaching  across. This will provide you more stability so that your muscles do not fatigue as quickly. By keeping your knees relaxed, or slightly bent, you will also reduce stress across your low back. CORRECT LIFTING TECHNIQUES DO :   Assume a wide stance. This will provide you more stability and the opportunity to get as close as possible to the object which you are lifting.  Tense your abdominals to brace your spine; then bend at the knees and hips. Keeping your back locked in a neutral-spine position, lift using your leg muscles. Lift with your legs, keeping your back straight.  Test the weight of unknown objects before attempting to lift them.  Try to keep your elbows locked down at your sides in order get the best strength from your shoulders when carrying an object.  Always ask for help when lifting heavy or awkward objects. INCORRECT LIFTING TECHNIQUES DO NOT:   Lock your knees when lifting, even if it is a small object.  Bend and twist. Pivot at your feet or move your feet when needing to change directions.  Assume that you  cannot safely pick up a paperclip without proper posture. Document Released: 10/27/2005 Document Revised: 03/13/2014 Document Reviewed: 02/08/2009 [facility] Patient Information 2015 [place], [place]. This information is not intended to replace advice given to you by your health care provider. Make sure you discuss any questions you have with your health care provider.        Neta Mends. Panosh M.D.

## 2015-07-12 ENCOUNTER — Encounter: Payer: Self-pay | Admitting: Internal Medicine

## 2015-08-06 ENCOUNTER — Other Ambulatory Visit: Payer: Self-pay

## 2015-09-02 ENCOUNTER — Other Ambulatory Visit: Payer: Self-pay | Admitting: Internal Medicine

## 2015-09-05 NOTE — Telephone Encounter (Signed)
Sent to the pharmacy by e-scribe. 

## 2015-09-16 ENCOUNTER — Other Ambulatory Visit: Payer: Self-pay | Admitting: Internal Medicine

## 2015-09-19 ENCOUNTER — Other Ambulatory Visit: Payer: Self-pay | Admitting: Family Medicine

## 2015-09-19 ENCOUNTER — Telehealth: Payer: Self-pay | Admitting: Family Medicine

## 2015-09-19 DIAGNOSIS — Z0000 Encounter for general adult medical examination without abnormal findings: Secondary | ICD-10-CM

## 2015-09-19 NOTE — Telephone Encounter (Signed)
Pt does not have any health insurance and will callback

## 2015-09-19 NOTE — Telephone Encounter (Signed)
Sent to the pharmacy by e-scribe. 

## 2015-09-19 NOTE — Telephone Encounter (Signed)
Pt is due for cpx and lab work 12/2015.  I have placed the lab orders.  Please help the pt to make both appointments.  Thanks!

## 2015-12-16 ENCOUNTER — Other Ambulatory Visit: Payer: Self-pay | Admitting: Internal Medicine

## 2015-12-17 NOTE — Telephone Encounter (Signed)
Sent to the pharmacy by e-scribe for six months.  Pt due for yearly check 06/2016.

## 2016-03-02 ENCOUNTER — Other Ambulatory Visit: Payer: Self-pay | Admitting: Internal Medicine

## 2016-03-04 NOTE — Telephone Encounter (Signed)
Sent to the pharmacy by e-scribe. 

## 2016-06-01 ENCOUNTER — Other Ambulatory Visit: Payer: Self-pay | Admitting: Internal Medicine

## 2016-06-03 ENCOUNTER — Telehealth: Payer: Self-pay | Admitting: Family Medicine

## 2016-06-03 NOTE — Telephone Encounter (Signed)
Pt is due for cpx and lab work in Aug 2017.  There are lab orders in the system.  Please help her to make both appointments.  Thanks!!

## 2016-06-03 NOTE — Telephone Encounter (Signed)
Sent to the pharmacy by e-scribe.  Message sent to scheduling.  Pt due for cpx Aug 2017

## 2016-06-03 NOTE — Telephone Encounter (Signed)
lmom for pt to call back

## 2016-06-10 NOTE — Telephone Encounter (Signed)
lmom for pt to call back

## 2016-06-12 NOTE — Telephone Encounter (Signed)
Pt states she does not have any insurance right now. Pt needs to put off her cpx for a while.

## 2016-06-12 NOTE — Telephone Encounter (Signed)
Noted  

## 2016-06-23 ENCOUNTER — Other Ambulatory Visit: Payer: Self-pay | Admitting: Internal Medicine

## 2016-06-25 ENCOUNTER — Telehealth: Payer: Self-pay | Admitting: Family Medicine

## 2016-06-25 NOTE — Telephone Encounter (Signed)
Misty pt does not have insurance and does not want to sch cpx

## 2016-06-25 NOTE — Telephone Encounter (Signed)
Sent to the pharmacy by e-scribe for 30 days.  Message sent to scheduling.  Pt now due for cpx and lab work.

## 2016-06-25 NOTE — Telephone Encounter (Signed)
Pt now due for cpx and lab work.  Orders are in the system.  Please help the pt to make both appointments.  Thanks!!

## 2016-06-26 NOTE — Telephone Encounter (Signed)
Noted  

## 2016-07-27 ENCOUNTER — Other Ambulatory Visit: Payer: Self-pay | Admitting: Internal Medicine

## 2016-07-29 NOTE — Telephone Encounter (Signed)
Pt does not have insurance, please advise.

## 2016-08-01 NOTE — Telephone Encounter (Signed)
Can refill x 90 days  still needs OV  To do med assessment once a year  She may want to check [facility] for better deals on her meds  But would  Still need to comei n for rx and med assessment and  Minimal lab monitoring

## 2016-08-01 NOTE — Telephone Encounter (Signed)
Pt called to check to status of her Rx and states she really needs it because of the type of Rx it is.  If you need to contact pt she state it is okay.

## 2016-08-01 NOTE — Telephone Encounter (Signed)
Pt scheduled for 10/14/16 for yearly med check.  Medication sent to the pharmacy by e-scribe.

## 2016-08-31 ENCOUNTER — Other Ambulatory Visit: Payer: Self-pay | Admitting: Internal Medicine

## 2016-09-03 NOTE — Telephone Encounter (Signed)
Sent to the pharmacy for 90 days.  Pt has upcoming cpx on 10/14/16.

## 2016-10-13 NOTE — Progress Notes (Signed)
Pre visit review using our clinic review tool, if applicable. No additional management support is needed unless otherwise documented below in the visit note.  Chief Complaint  Patient presents with  . Follow-up    HPI: Patient  Shelby Wong  65 y.o. comes in today for overdue  Health Care visit  Chronic disease management She still has no insurance and doesn't make enough money to get the vomiting care. She lives with her mother lives off of some of her IRA. She feels that she is doing pretty well blood pressure medicine is taken without side effects hasn't checked her readings because it always been good. Taking thyroid medicine without change. Will need refills. Thyroid  still on fluoxetine  Seems to help  Grouchy when misses doses     Health Maintenance  Topic Date Due  . INFLUENZA VACCINE  04/09/2017 (Originally 06/10/2016)  . MAMMOGRAM  10/13/2017 (Originally 05/01/2003)  . PAP SMEAR  10/13/2017 (Originally 12/30/2015)  . ZOSTAVAX  10/13/2017 (Originally 04/30/2013)  . Hepatitis C Screening  10/13/2017 (Originally Jan 09, 1951)  . HIV Screening  10/13/2017 (Originally 04/[birth year]/1968)  . COLONOSCOPY  01/30/2018  . TETANUS/TDAP  04/01/2020     ROS:  denies osa sx  GEN/ HEENT: No fever, significant weight changes sweats headaches vision problems hearing changes, CV/ PULM; No chest pain shortness of breath cough, syncope,edema  change in exercise tolerance. GI /GU: No adominal pain, vomiting, change in bowel habits. No blood in the stool. SKIN/HEME: ,no acute skin rashes suspicious lesions or bleeding. No lymphadenopathy, nodules, masses.  NEURO/ PSYCH:  No neurologic signs such as weakness numbness. No depression anxiety. IMM/ Allergy: No unusual infections.   REST of 12 system review negative except as per HPI   Past Medical History:  Diagnosis Date  . Allergic rhinitis   . Anxiety   . Depression   . Diverticulosis of colon (without mention of hemorrhage)   . Elevated LFTs   . GERD  (gastroesophageal reflux disease)   . Hemorrhoids   . Hiatal hernia   . Hx of echocardiogram    a. Echo 3/14:  Mild LVH, EF 55-65%, normal wall motion, PASP 33  . Hypothyroidism   . IBS (irritable bowel syndrome)   . Myocarditis (HCC)    a. NSTEMI:  12/2011 with peak troponin 2. Cath - mild nonobstructive plaque.;  b. nstemi 01/2012, relook cath - nonobs - MRI sugg of myocarditis  . Obesity   . POLYP, ANAL AND RECTAL 09/07/2007   Qualifier: Diagnosis of  By: Lawernce Ion, CMA (AAMA), Bethann Berkshire   . Pulmonary HTN    Enlarged pulmonary artery by CT angio 12/2011 - PAP by echo 12/2011  . Shingles 07/09/2012  . Transaminitis    Abd [place] 12/2011 - hepatic steatosis    Past Surgical History:  Procedure Laterality Date  . CHOLECYSTECTOMY    . LEFT AND RIGHT HEART CATHETERIZATION WITH CORONARY ANGIOGRAM N/A 01/16/2012   Procedure: LEFT AND RIGHT HEART CATHETERIZATION WITH CORONARY ANGIOGRAM;  Surgeon: Wendall Stade, MD;  Location: [facility] C[facility] LAB;  Service: Cardiovascular;  Laterality: N/A;  . LEFT HEART CATHETERIZATION WITH CORONARY ANGIOGRAM N/A 12/15/2011   Procedure: LEFT HEART CATHETERIZATION WITH CORONARY ANGIOGRAM;  Surgeon: Peter M Swaziland, MD;  Location: [facility] CATH LAB;  Service: Cardiovascular;  Laterality: N/A;  . TONSILLECTOMY AND ADENOIDECTOMY      Family History  Problem Relation Age of Onset  . Arthritis Mother     osteo  . Breast cancer Mother   .  Coronary artery disease Mother   . Other Mother     hearing loss  . Diabetes Brother     50 s  . Thyroid disease Mother   . Colon cancer Neg Hx     Social History   Social History  . Marital status: Divorced    Spouse name: N/A  . Number of children: 1  . Years of education: N/A   Occupational History  . Direct Sales Rep    Social History Main Topics  . Smoking status: Former Smoker    Quit date: 09/11/2006  . Smokeless tobacco: Never Used  . Alcohol use Yes     Comment: occassional  . Drug use: No  . Sexual activity: Not  Currently    Birth control/ protection: Post-menopausal   Other Topics Concern  . 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 Esomeprazole Magnesium (NEXIUM PO) Take by mouth.    Marland Kitchen lisinopril-hydrochlorothiazide (PRINZIDE,ZESTORETIC) 10-12.5 MG per tablet TAKE ONE TABLET BY MOUTH ONCE DAILY 90 tablet 0  . Loratadine (CLARITIN) 10 MG CAPS Take 10 mg by mouth daily as needed. As needed for sinus    . Cholecalciferol (VITAMIN D) 400 UNITS capsule Take 400 Units by mouth daily.      Marland Kitchen FLUoxetine (PROZAC) 10 MG capsule TAKE ONE CAPSULE BY MOUTH ONCE DAILY 30 capsule 2  . levothyroxine (SYNTHROID, LEVOTHROID) 100 MCG tablet TAKE ONE TABLET BY MOUTH ONCE DAILY 90 tablet 0  . lisinopril-hydrochlorothiazide (PRINZIDE,ZESTORETIC) 10-12.5 MG tablet TAKE ONE TABLET BY MOUTH ONCE DAILY 90 tablet 0   No facility-administered medications prior to visit.      EXAM:  BP 120/80 (BP Location: Right Arm, Cuff Size: Large)   Temp 98.4 F (36.9 C) (Oral)   Ht 5\' 4"  (1.626 m)   Wt 244 lb 3.2 oz (110.8 kg)   BMI 41.92 kg/m   Body mass index is 41.92 kg/m.  Physical Exam: Vital signs reviewed ZOX:WRUE is a well-developed well-nourished alert cooperative    who appearsr stated age in no acute distress.  HEENT: normocephalic atraumatic , Eyes: PERRL EOM's full, conjunctiva clear, Nares: paten,t no deformity discharge or tenderness., Ears: no deformity EAC's clear TMs with normal landmarks. Mouth: clear OP, no lesions, edema.  Moist mucous membranes. Dentition in adequate repair. NECK: supple without masses, thyromegaly or bruits. CHEST/PULM:  Clear to auscultation and percussion breath  sounds equal no wheeze , rales or rhonchi.  CV: PMI is nondisplaced, S1 S2 no gallops, murmurs, rubs. Peripheral pulses are full without delay.No JVD .  ABDOMEN: Bowel sounds normal nontender  No guard or rebound, no hepato splenomegal no CVA tenderness.   Extremtities:  No clubbing cyanosis or edema, no acute joint swelling or redness no focal atrophy NEURO:  Oriented x3, cranial nerves 3-12 appear to be intact, no obvious focal weakness,gait within normal limits no abnormal reflexes or asymmetrical SKIN: No acute rashes normal turgor, color, no bruising or petechiae. PSYCH: Oriented, good eye contact, no obvious depression anxiety, cognition and judgment appear normal. LN: no cervical  adenopathy  Wt Readings from Last 3 Encounters:  10/14/16 244 lb 3.2 oz (110.8 kg)  07/09/15 238 lb (108 kg)  08/29/14 235 lb 4.8 oz (106.7 kg)   BP Readings from Last 3 Encounters:  10/14/16 120/80  07/09/15 106/70  08/29/14 124/78   Lab Results  Component Value Date   WBC 6.5 07/05/2015   HGB 12.7 07/05/2015   HCT 38.3 07/05/2015   PLT 250.0 07/05/2015   GLUCOSE 83 10/14/2016   CHOL 183 07/05/2015   TRIG 151.0 (H) 07/05/2015   HDL 38.40 (L) 07/05/2015   LDLCALC 115 (H) 07/05/2015   ALT 6 07/05/2015   AST 16 07/05/2015   NA 140 10/14/2016   K 5.0 10/14/2016   CL 103 10/14/2016   CREATININE 1.31 (H) 10/14/2016   BUN 20 10/14/2016   CO2 30 10/14/2016   TSH 1.44 10/14/2016   INR 1.16 01/16/2012    ASSESSMENT AND PLAN:  Discussed the following assessment and plan:  Lv hypertrophy, hypertensive, without heart failure - Plan: Basic metabolic panel, TSH  Medication management - continue  fluoextine - Plan: Basic metabolic panel, TSH  Hypothyroidism, unspecified type - Plan: Basic metabolic panel, TSH  Does not have health insurance Over due for labs financial issue   Insurance coverage  hypertensive heart disease blood monitoring today  HCM  Not covered  Disc   Get  Plans for    Patient Care Team: Madelin Headings, MD as PCP - General Mardella Layman, MD (Gastroenterology) Tonny Bollman, MD (Cardiology) Patient Instructions   bp on repeat   Is good    bp control    Is important .     And 120 /80 goal  And below   Repeat monthly and as needed.  Want to prevent  Heart failure and heart attack stroke  Will notify you  of labs when available.   And continue same meds for now . If you BP    Is not at goal      Let us know and we will change the formulation of the   Medication.  Wt Readings from Last 3 Encounters:  10/14/16 244 lb 3.2 oz (110.8 kg)  07/09/15 238 lb (108 kg)  08/29/14 235 lb 4.8 oz (106.7 kg)  healthy weight loss will help  Your  Blood pressure and carrdica risk .   Please lose weight  Eating healthy  Mediterranean diet . Get your cholesterol checked where  You can   And send [place] the result    lifestyle intervention healthy eating and exercise .Sometimes medication is advised .  Colonc cancer screening and mammograms are still a dvised        Mediterranean Diet A Mediterranean diet refers to food and lifestyle choices that are based on the traditions of countries located on the [employer]. This way of eating has been shown to help prevent certain conditions and improve outcomes for people who have chronic diseases, like kidney disease and heart disease. What are tips for following this plan? Lifestyle  Cook and eat meals together with your family, when possible.  Drink enough fluid to keep your urine clear or pale yellow.  Be physically active every day. This includes:  Aerobic exercise like running or swimming.  Leisure activities like gardening, walking, or housework.  Get 7-8 hours of sleep each night.  If recommended by your health care provider, drink red wine in moderation. This means 1 glass a day for nonpregnant women and 2 glasses a day  for men. A glass of wine equals 5 oz (150 mL). Reading food labels  Check the  serving size of packaged foods. For foods such as rice and pasta, the serving size refers to the amount of cooked product, not dry.  Check the total fat in packaged foods. Avoid foods that have saturated fat or trans fats.  Check the ingredients list for added sugars, such as corn syrup. Shopping  At the grocery store, buy most of your food from the areas near the walls of the store. This includes:  Fresh fruits and vegetables (produce).  Grains, beans, nuts, and seeds. Some of these may be available in unpackaged forms or large amounts (in bulk).  Fresh seafood.  Poultry and eggs.  Low-fat dairy products.  Buy whole ingredients instead of prepackaged foods.  Buy fresh fruits and vegetables in-season from local farmers markets.  Buy frozen fruits and vegetables in resealable bags.  If you do not have access to quality fresh seafood, buy precooked frozen shrimp or canned fish, such as tuna, salmon, or sardines.  Buy small amounts of raw or cooked vegetables, salads, or olives from the deli or salad bar at your store.  Stock your pantry so you always have certain foods on hand, such as olive oil, canned tuna, canned tomatoes, rice, pasta, and beans. Cooking  Cook foods with extra-virgin olive oil instead of using butter or other vegetable oils.  Have meat as a side dish, and have vegetables or grains as your main dish. This means having meat in small portions or adding small amounts of meat to foods like pasta or stew.  Use beans or vegetables instead of meat in common dishes like chili or lasagna.  Experiment with different cooking methods. Try roasting or broiling vegetables instead of steaming or sauteing them.  Add frozen vegetables to soups, stews, pasta, or rice.  Add nuts or seeds for added healthy fat at each meal. You can add these to yogurt, salads, or vegetable dishes.  Marinate fish or vegetables using olive oil, lemon juice, garlic, and fresh herbs. Meal  planning  Plan to eat 1 vegetarian meal one day each week. Try to work up to 2 vegetarian meals, if possible.  Eat seafood 2 or more times a week.  Have healthy snacks readily available, such as:  Vegetable sticks with hummus.  Greek yogurt.  Fruit and nut trail mix.  Eat balanced meals throughout the week. This includes:  Fruit: 2-3 servings a day  Vegetables: 4-5 servings a day  Low-fat dairy: 2 servings a day  Fish, poultry, or lean meat: 1 serving a day  Beans and legumes: 2 or more servings a week  Nuts and seeds: 1-2 servings a day  Whole grains: 6-8 servings a day  Extra-virgin olive oil: 3-4 servings a day  Limit red meat and sweets to only a few servings a month What are my food choices?  Mediterranean diet  Recommended  Grains: Whole-grain pasta. Brown rice. Bulgar wheat. Polenta. Couscous. Whole-wheat bread. Orpah Cobbatmeal. Quinoa.  Vegetables: Artichokes. Beets. Broccoli. Cabbage. Carrots. Eggplant. Green beans. Chard. Kale. Spinach. Onions. Leeks. Peas. Squash. Tomatoes. Peppers. Radishes.  Fruits: Apples. Apricots. Avocado. Berries. Bananas. Cherries. Dates. Figs. Grapes. Lemons. Melon. Oranges. Peaches. Plums. Pomegranate.  Meats and other protein foods: Beans. Almonds. Sunflower seeds. Pine nuts. Peanuts. Cod. Salmon. Scallops. Shrimp. Tuna. Tilapia. Clams. Oysters. Eggs.  Dairy: Low-fat milk. Cheese. Greek yogurt.  Beverages: Water. Red wine. Herbal tea.  Fats and oils: Extra virgin olive oil.  Avocado oil. Grape seed oil.  Sweets and desserts: [place] yogurt with honey. Baked apples. Poached pears. Trail mix.  Seasoning and other foods: Basil. Cilantro. Coriander. Cumin. Mint. Parsley. Sage. Rosemary. Tarragon. Garlic. Oregano. Thyme. Pepper. Balsalmic vinegar. Tahini. Hummus. Tomato sauce. Olives. Mushrooms.  Limit these  Grains: Prepackaged pasta or rice dishes. Prepackaged cereal with added sugar.  Vegetables: Deep fried potatoes (french  fries).  Fruits: Fruit canned in syrup.  Meats and other protein foods: Beef. Pork. Lamb. Poultry with skin. Hot dogs. Tomasa BlaseBacon.  Dairy: Ice cream. Sour cream. Whole milk.  Beverages: Juice. Sugar-sweetened soft drinks. Beer. Liquor and spirits.  Fats and oils: Butter. Canola oil. Vegetable oil. Beef fat (tallow). Lard.  Sweets and desserts: Cookies. Cakes. Pies. Candy.  Seasoning and other foods: Mayonnaise. Premade sauces and marinades.  The items listed may not be a complete list. Talk with your dietitian about what dietary choices are right for you. Summary  The Mediterranean diet includes both food and lifestyle choices.  Eat a variety of fresh fruits and vegetables, beans, nuts, seeds, and whole grains.  Limit the amount of red meat and sweets that you eat.  Talk with your health care provider about whether it is safe for you to drink red wine in moderation. This means 1 glass a day for nonpregnant women and 2 glasses a day for men. A glass of wine equals 5 oz (150 mL). This information is not intended to replace advice given to you by your health care provider. Make sure you discuss any questions you have with your health care provider. Document Released: 06/19/2016 Document Revised: 07/22/2016 Document Reviewed: 06/19/2016 Elsevier Interactive Patient Education  2017 [employer]       Millers FallsWanda K. Karstyn Birkey M.D.

## 2016-10-14 ENCOUNTER — Encounter: Payer: Self-pay | Admitting: Internal Medicine

## 2016-10-14 ENCOUNTER — Ambulatory Visit (INDEPENDENT_AMBULATORY_CARE_PROVIDER_SITE_OTHER): Payer: Self-pay | Admitting: Internal Medicine

## 2016-10-14 VITALS — BP 120/80 | Temp 98.4°F | Ht 64.0 in | Wt 244.2 lb

## 2016-10-14 DIAGNOSIS — Z5989 Other problems related to housing and economic circumstances: Secondary | ICD-10-CM

## 2016-10-14 DIAGNOSIS — I119 Hypertensive heart disease without heart failure: Secondary | ICD-10-CM

## 2016-10-14 DIAGNOSIS — Z79899 Other long term (current) drug therapy: Secondary | ICD-10-CM

## 2016-10-14 DIAGNOSIS — E039 Hypothyroidism, unspecified: Secondary | ICD-10-CM

## 2016-10-14 DIAGNOSIS — Z598 Other problems related to housing and economic circumstances: Secondary | ICD-10-CM

## 2016-10-14 LAB — BASIC METABOLIC PANEL
BUN: 20 mg/dL (ref 6–23)
CHLORIDE: 103 meq/L (ref 96–112)
CO2: 30 meq/L (ref 19–32)
CREATININE: 1.31 mg/dL — AB (ref 0.40–1.20)
Calcium: 9.2 mg/dL (ref 8.4–10.5)
GFR: 43.52 mL/min — ABNORMAL LOW (ref >60.00)
GLUCOSE: 83 mg/dL (ref 70–99)
POTASSIUM: 5 meq/L (ref 3.5–5.1)
Sodium: 140 mEq/L (ref 135–145)

## 2016-10-14 LAB — TSH: TSH: 1.44 u[IU]/mL (ref 0.35–4.50)

## 2016-10-14 MED ORDER — FLUOXETINE HCL 10 MG PO CAPS
10.0000 mg | ORAL_CAPSULE | Freq: Every day | ORAL | 3 refills | Status: DC
Start: 2016-10-14 — End: 2017-10-27

## 2016-10-14 MED ORDER — LISINOPRIL-HYDROCHLOROTHIAZIDE 10-12.5 MG PO TABS: 1.0000 | ORAL_TABLET | Freq: Every day | ORAL | 3 refills | Status: DC

## 2016-10-14 MED ORDER — LEVOTHYROXINE SODIUM 100 MCG PO TABS: 100.0000 ug | ORAL_TABLET | Freq: Every day | ORAL | 3 refills | Status: DC

## 2016-10-14 NOTE — Patient Instructions (Addendum)
bp on repeat   Is good    bp control    Is important .     And 120 /80 goal  And below   Repeat monthly and as needed.  Want to prevent  Heart failure and heart attack stroke  Will notify you  of labs when available.   And continue same meds for now . If you BP    Is not at goal      Let us know and we will change the formulation of the   Medication.  Wt Readings from Last 3 Encounters:  10/14/16 244 lb 3.2 oz (110.8 kg)  07/09/15 238 lb (108 kg)  08/29/14 235 lb 4.8 oz (106.7 kg)  healthy weight loss will help  Your  Blood pressure and carrdica risk .   Please lose weight  Eating healthy  Mediterranean diet . Get your cholesterol checked where  You can   And send us the result    lifestyle intervention healthy eating and exercise .Sometimes medication is advised .  Colonc cancer screening and mammograms are still a dvised        Mediterranean Diet A Mediterranean diet refers to food and lifestyle choices that are based on the traditions of countries located on the [place]. This way of eating has been shown to help prevent certain conditions and improve outcomes for people who have chronic diseases, like kidney disease and heart disease. What are tips for following this plan? Lifestyle  Cook and eat meals together with your family, when possible.  Drink enough fluid to keep your urine clear or pale yellow.  Be physically active every day. This includes:  Aerobic exercise like running or swimming.  Leisure activities like gardening, walking, or housework.  Get 7-8 hours of sleep each night.  If recommended by your health care provider, drink red wine in moderation. This means 1 glass a day for nonpregnant women and 2 glasses a day for men. A glass of wine equals 5 oz (150 mL). Reading food labels  Check the serving size of packaged foods. For foods such as rice and pasta, the serving size refers to the amount of cooked product, not dry.  Check the total fat in  packaged foods. Avoid foods that have saturated fat or trans fats.  Check the ingredients list for added sugars, such as corn syrup. Shopping  At the grocery store, buy most of your food from the areas near the walls of the store. This includes:  Fresh fruits and vegetables (produce).  Grains, beans, nuts, and seeds. Some of these may be available in unpackaged forms or large amounts (in bulk).  Fresh seafood.  Poultry and eggs.  Low-fat dairy products.  Buy whole ingredients instead of prepackaged foods.  Buy fresh fruits and vegetables in-season from local farmers markets.  Buy frozen fruits and vegetables in resealable bags.  If you do not have access to quality fresh seafood, buy precooked frozen shrimp or canned fish, such as tuna, salmon, or sardines.  Buy small amounts of raw or cooked vegetables, salads, or olives from the deli or salad bar at your store.  Stock your pantry so you always have certain foods on hand, such as olive oil, canned tuna, canned tomatoes, rice, pasta, and beans. Cooking  Cook foods with extra-virgin olive oil instead of using butter or other vegetable oils.  Have meat as a side dish, and have vegetables or grains as your main dish. This means having meat in  small portions or adding small amounts of meat to foods like pasta or stew.  Use beans or vegetables instead of meat in common dishes like chili or lasagna.  Experiment with different cooking methods. Try roasting or broiling vegetables instead of steaming or sauteing them.  Add frozen vegetables to soups, stews, pasta, or rice.  Add nuts or seeds for added healthy fat at each meal. You can add these to yogurt, salads, or vegetable dishes.  Marinate fish or vegetables using olive oil, lemon juice, garlic, and fresh herbs. Meal planning  Plan to eat 1 vegetarian meal one day each week. Try to work up to 2 vegetarian meals, if possible.  Eat seafood 2 or more times a week.  Have  healthy snacks readily available, such as:  Vegetable sticks with hummus.  Greek yogurt.  Fruit and nut trail mix.  Eat balanced meals throughout the week. This includes:  Fruit: 2-3 servings a day  Vegetables: 4-5 servings a day  Low-fat dairy: 2 servings a day  Fish, poultry, or lean meat: 1 serving a day  Beans and legumes: 2 or more servings a week  Nuts and seeds: 1-2 servings a day  Whole grains: 6-8 servings a day  Extra-virgin olive oil: 3-4 servings a day  Limit red meat and sweets to only a few servings a month What are my food choices?  Mediterranean diet  Recommended  Grains: Whole-grain pasta. Brown rice. Bulgar wheat. Polenta. Couscous. Whole-wheat bread. Orpah Cobb.  Vegetables: Artichokes. Beets. Broccoli. Cabbage. Carrots. Eggplant. Green beans. Chard. Kale. Spinach. Onions. Leeks. Peas. Squash. Tomatoes. Peppers. Radishes.  Fruits: Apples. Apricots. Avocado. Berries. Bananas. Cherries. Dates. Figs. Grapes. Lemons. Melon. Oranges. Peaches. Plums. Pomegranate.  Meats and other protein foods: Beans. Almonds. Sunflower seeds. Pine nuts. Peanuts. Cod. Salmon. Scallops. Shrimp. Tuna. Tilapia. Clams. Oysters. Eggs.  Dairy: Low-fat milk. Cheese. Greek yogurt.  Beverages: Water. Red wine. Herbal tea.  Fats and oils: Extra virgin olive oil. Avocado oil. Grape seed oil.  Sweets and desserts: [place] yogurt with honey. Baked apples. Poached pears. Trail mix.  Seasoning and other foods: Basil. Cilantro. Coriander. Cumin. Mint. Parsley. Sage. Rosemary. Tarragon. Garlic. Oregano. Thyme. Pepper. Balsalmic vinegar. Tahini. Hummus. Tomato sauce. Olives. Mushrooms.  Limit these  Grains: Prepackaged pasta or rice dishes. Prepackaged cereal with added sugar.  Vegetables: Deep fried potatoes (french fries).  Fruits: Fruit canned in syrup.  Meats and other protein foods: Beef. Pork. Lamb. Poultry with skin. Hot dogs. Tomasa Blase.  Dairy: Ice cream. Sour cream.  Whole milk.  Beverages: Juice. Sugar-sweetened soft drinks. Beer. Liquor and spirits.  Fats and oils: Butter. Canola oil. Vegetable oil. Beef fat (tallow). Lard.  Sweets and desserts: Cookies. Cakes. Pies. Candy.  Seasoning and other foods: Mayonnaise. Premade sauces and marinades.  The items listed may not be a complete list. Talk with your dietitian about what dietary choices are right for you. Summary  The Mediterranean diet includes both food and lifestyle choices.  Eat a variety of fresh fruits and vegetables, beans, nuts, seeds, and whole grains.  Limit the amount of red meat and sweets that you eat.  Talk with your health care provider about whether it is safe for you to drink red wine in moderation. This means 1 glass a day for nonpregnant women and 2 glasses a day for men. A glass of wine equals 5 oz (150 mL). This information is not intended to replace advice given to you by your health care provider. Make sure you discuss any  questions you have with your health care provider. Document Released: 06/19/2016 Document Revised: 07/22/2016 Document Reviewed: 06/19/2016 Elsevier Interactive Patient Education  2017 [employer]

## 2016-10-16 ENCOUNTER — Other Ambulatory Visit: Payer: Self-pay | Admitting: Family Medicine

## 2016-10-16 ENCOUNTER — Telehealth: Payer: Self-pay | Admitting: Family Medicine

## 2016-10-16 DIAGNOSIS — N289 Disorder of kidney and ureter, unspecified: Secondary | ICD-10-CM

## 2016-10-16 NOTE — Telephone Encounter (Signed)
Left a message for the pt to return my call concerning lab results.

## 2017-02-02 ENCOUNTER — Other Ambulatory Visit: Payer: Self-pay

## 2017-07-31 ENCOUNTER — Encounter: Payer: Self-pay | Admitting: Internal Medicine

## 2017-10-27 ENCOUNTER — Other Ambulatory Visit: Payer: Self-pay | Admitting: Internal Medicine

## 2017-11-23 ENCOUNTER — Other Ambulatory Visit: Payer: Self-pay | Admitting: Internal Medicine

## 2018-01-24 ENCOUNTER — Encounter: Payer: Self-pay | Admitting: Gastroenterology

## 2018-01-25 ENCOUNTER — Other Ambulatory Visit: Payer: Self-pay | Admitting: Internal Medicine

## 2018-02-08 NOTE — Progress Notes (Signed)
Chief Complaint  Patient presents with  . Medication Refill    refills, trip coming up, possible immunizations    HPI: Shelby Wong 67 y.o. come in for Chronic disease management    Son destination  [place].   May 23 ... [place] .   And ask   about shots   [place]   3[place] days   No other travel.   Bp taking med and doing ok   Thyroid:   Taking  In am   Takes low dose prozac   nexium otc and   Gets sx without  ROS: See pertinent positives and negatives per HPI. No cp sob  Has joint aches back issues at times  sciatica   Past Medical History:  Diagnosis Date  . Allergic rhinitis   . Anxiety   . Depression   . Diverticulosis of colon (without mention of hemorrhage)   . Elevated LFTs   . GERD (gastroesophageal reflux disease)   . Hemorrhoids   . Hiatal hernia   . Hx of echocardiogram    a. Echo 3/14:  Mild LVH, EF 55-65%, normal wall motion, PASP 33  . Hypothyroidism   . IBS (irritable bowel syndrome)   . Myocarditis (HCC)    a. NSTEMI:  12/2011 with peak troponin 2. Cath - mild nonobstructive plaque.;  b. nstemi 01/2012, relook cath - nonobs - MRI sugg of myocarditis  . Obesity   . POLYP, ANAL AND RECTAL 09/07/2007   Qualifier: Diagnosis of  By: Lawernce Ion, CMA (AAMA), Bethann Berkshire   . Pulmonary HTN (HCC)    Enlarged pulmonary artery by CT angio 12/2011 - PAP by echo 12/2011  . Shingles 07/09/2012  . Transaminitis    Abd [place] 12/2011 - hepatic steatosis    Family History  Problem Relation Age of Onset  . Arthritis Mother        osteo  . Breast cancer Mother   . Coronary artery disease Mother   . Other Mother        hearing loss  . Diabetes Brother        50 s  . Thyroid disease Mother   . Colon cancer Neg Hx     Social History   Socioeconomic History  . Marital status: Divorced    Spouse name: Not on file  . Number of children: 1  . Years of education: Not on file  . Highest education level: Not on file  Occupational History  .  Occupation: Chemical engineer Rep  Social Needs  . Financial resource strain: Not on file  . Food insecurity:    Worry: Not on file    Inability: Not on file  . Transportation needs:    Medical: Not on file    Non-medical: Not on file  Tobacco Use  . Smoking status: Former Smoker    Last attempt to quit: 09/11/2006    Years since quitting: 11.4  . Smokeless tobacco: Never Used  Substance and Sexual Activity  . Alcohol use: Yes    Comment: occassional  . Drug use: No  . Sexual activity: Not Currently    Birth control/protection: Post-menopausal  Lifestyle  . Physical activity:    Days per week: Not on file    Minutes per session: Not on file  . Stress: Not on file  Relationships  . Social connections:    Talks on phone: Not on file    Gets together: Not on file    Attends  religious service: Not on file    Active member of club or organization: Not on file    Attends meetings of clubs or organizations: Not on file    Relationship status: Not on file  Other Topics Concern  . 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 acetaminophen (TYLENOL ARTHRITIS PAIN) 650 MG CR tablet Take 1,300 mg by mouth every 12 (twelve) hours.    . Esomeprazole Magnesium (NEXIUM PO) Take by mouth.    Marland Kitchen FLUoxetine (PROZAC) 10 MG capsule TAKE 1 CAPSULE BY MOUTH ONCE DAILY *NEED  OFFICE  VISIT* 90 capsule 0  . Loratadine (CLARITIN) 10 MG CAPS Take 10 mg by mouth daily as needed. As needed for sinus    . levothyroxine (SYNTHROID, LEVOTHROID) 100 MCG tablet Take 1 tablet (100 mcg total) by mouth daily. Due for med check visit. 90 tablet 0  . lisinopril-hydrochlorothiazide  (PRINZIDE,ZESTORETIC) 10-12.5 MG per tablet TAKE ONE TABLET BY MOUTH ONCE DAILY 90 tablet 0  . lisinopril-hydrochlorothiazide (PRINZIDE,ZESTORETIC) 10-12.5 MG tablet Take 1 tablet by mouth daily. Due for med check visit. (Patient not taking: Reported on 02/09/2018) 90 tablet 0   No facility-administered medications prior to visit.      EXAM:  BP 132/72 (BP Location: Left Arm, Patient Position: Sitting, Cuff Size: Large)   Pulse 90   Temp 97.9 F (36.6 C) (Oral)   Wt 248 lb 3.2 oz (112.6 kg)   BMI 42.60 kg/m   Body mass index is 42.6 kg/m.  GENERAL: vitals reviewed and listed above, alert, oriented, appears well hydrated and in no acute distress HEENT: atraumatic, conjunctiva  clear, no obvious abnormalities on inspection of external nose and ears OP : no lesion edema or exudate  NECK: no obvious masses on inspection palpation  LUNGS: clear to auscultation bilaterally, no wheezes, rales or rhonchi, good air movement CV: HRRR, no clubbing cyanosis or  peripheral edema nl cap refill  MS: moves all extremities without noticeable focal  abnormality PSYCH: pleasant and cooperative, no obvious depression or anxiety Lab Results  Component Value Date   WBC 6.5 07/05/2015   HGB 12.7 07/05/2015   HCT 38.3 07/05/2015   PLT 250.0 07/05/2015   GLUCOSE 83 10/14/2016   CHOL 183 07/05/2015   TRIG 151.0 (H) 07/05/2015   HDL 38.40 (L) 07/05/2015   LDLCALC 115 (H) 07/05/2015   ALT 6 07/05/2015   AST 16 07/05/2015   NA 140 10/14/2016   K 5.0 10/14/2016   CL 103 10/14/2016   CREATININE 1.31 (H) 10/14/2016   BUN 20 10/14/2016   CO2 30 10/14/2016   TSH 1.44 10/14/2016   INR 1.16 01/16/2012   BP Readings from Last 3 Encounters:  02/09/18 132/72  10/14/16 120/80  07/09/15 106/70   Wt Readings from Last 3 Encounters:  02/09/18 248 lb 3.2 oz (112.6 kg)  10/14/16 244 lb 3.2 oz (110.8 kg)  07/09/15 238 lb (108 kg)     ASSESSMENT AND PLAN:  Discussed the following assessment and  plan:  Lv hypertrophy, hypertensive, without heart failure - Plan:  Basic metabolic panel, CBC with Differential/Platelet, Hemoglobin A1c, Hepatic function panel, Lipid panel, TSH  Medication management - Plan: Basic metabolic panel, CBC with Differential/Platelet, Hemoglobin A1c, Hepatic function panel, Lipid panel, TSH  Hypothyroidism, unspecified type - Plan: Basic metabolic panel, CBC with Differential/Platelet, Hemoglobin A1c, Hepatic function panel, Lipid panel, TSH  Does not have health insurance  Decreased renal function - Plan: Basic metabolic panel, CBC with Differential/Platelet, Hemoglobin A1c, Hepatic function panel, Lipid panel, TSH  Hyperlipidemia, unspecified hyperlipidemia type - Plan: Basic metabolic panel, CBC with Differential/Platelet, Hemoglobin A1c, Hepatic function panel, Lipid panel, TSH Over due for monitoring but doing well  And will plan labs when on medicare ...    And  Welcome to medicare   -Patient advised to return or notify health care team  if  new concerns arise. dsic immuniz poss at hd  For cost reasons doesn't seem like needs    Typhoid vaccine  For type of travel .   Patient Instructions  Plan  Fasting   Labs  When you get on medicare .  I will out   orders for this.   Refill meds at this time.  until labs done .  Lab appt  And then  Welcome to medicare  appt .   Look into    Hepatitis   A, B  Vaccine  Begin series Last td 2011  Look  At health department about  Cost of vaccines  Etc  Or can make injection appt for here.           Neta MendsWanda K. Panhia Karl M.D.

## 2018-02-09 ENCOUNTER — Ambulatory Visit (INDEPENDENT_AMBULATORY_CARE_PROVIDER_SITE_OTHER): Payer: Self-pay | Admitting: Internal Medicine

## 2018-02-09 ENCOUNTER — Encounter: Payer: Self-pay | Admitting: Internal Medicine

## 2018-02-09 VITALS — BP 132/72 | HR 90 | Temp 97.9°F | Wt 248.2 lb

## 2018-02-09 DIAGNOSIS — I119 Hypertensive heart disease without heart failure: Secondary | ICD-10-CM

## 2018-02-09 DIAGNOSIS — Z598 Other problems related to housing and economic circumstances: Secondary | ICD-10-CM

## 2018-02-09 DIAGNOSIS — Z79899 Other long term (current) drug therapy: Secondary | ICD-10-CM

## 2018-02-09 DIAGNOSIS — N289 Disorder of kidney and ureter, unspecified: Secondary | ICD-10-CM

## 2018-02-09 DIAGNOSIS — E785 Hyperlipidemia, unspecified: Secondary | ICD-10-CM

## 2018-02-09 DIAGNOSIS — Z5989 Other problems related to housing and economic circumstances: Secondary | ICD-10-CM

## 2018-02-09 DIAGNOSIS — Z5971 Insufficient health insurance coverage: Secondary | ICD-10-CM

## 2018-02-09 DIAGNOSIS — E039 Hypothyroidism, unspecified: Secondary | ICD-10-CM

## 2018-02-09 MED ORDER — LISINOPRIL-HYDROCHLOROTHIAZIDE 10-12.5 MG PO TABS
1.0000 | ORAL_TABLET | Freq: Every day | ORAL | 0 refills | Status: DC
Start: 2018-02-09 — End: 2018-05-30

## 2018-02-09 MED ORDER — LEVOTHYROXINE SODIUM 100 MCG PO TABS
100.0000 ug | ORAL_TABLET | Freq: Every day | ORAL | 0 refills | Status: DC
Start: 2018-02-09 — End: 2018-05-30

## 2018-02-09 NOTE — Patient Instructions (Addendum)
Plan  Fasting   Labs  When you get on medicare .  I will out   orders for this.   Refill meds at this time.  until labs done .  Lab appt  And then  Welcome to medicare  appt .   Look into    Hepatitis   A, B  Vaccine  Begin series Last td 2011  Look  At health department about  Cost of vaccines  Etc  Or can make injection appt for here.

## 2018-04-25 ENCOUNTER — Other Ambulatory Visit: Payer: Self-pay | Admitting: Internal Medicine

## 2018-04-26 NOTE — Telephone Encounter (Signed)
LOV 02/09/2018 and last wrote 01/27/2018

## 2018-05-25 ENCOUNTER — Other Ambulatory Visit (INDEPENDENT_AMBULATORY_CARE_PROVIDER_SITE_OTHER): Payer: Self-pay

## 2018-05-25 DIAGNOSIS — Z79899 Other long term (current) drug therapy: Secondary | ICD-10-CM

## 2018-05-25 DIAGNOSIS — N289 Disorder of kidney and ureter, unspecified: Secondary | ICD-10-CM

## 2018-05-25 DIAGNOSIS — I119 Hypertensive heart disease without heart failure: Secondary | ICD-10-CM

## 2018-05-25 DIAGNOSIS — E039 Hypothyroidism, unspecified: Secondary | ICD-10-CM

## 2018-05-25 DIAGNOSIS — E785 Hyperlipidemia, unspecified: Secondary | ICD-10-CM

## 2018-05-25 LAB — LIPID PANEL
CHOL/HDL RATIO: 5
Cholesterol: 177 mg/dL (ref 0–200)
HDL: 38.7 mg/dL — ABNORMAL LOW (ref >39.00)
LDL Cholesterol: 106 mg/dL — ABNORMAL HIGH (ref 0–99)
NONHDL: 137.98
Triglycerides: 158 mg/dL — ABNORMAL HIGH (ref 0.0–149.0)
VLDL: 31.6 mg/dL (ref 0.0–40.0)

## 2018-05-25 LAB — HEPATIC FUNCTION PANEL
ALK PHOS: 69 U/L (ref 39–117)
ALT: 8 U/L (ref 0–35)
AST: 14 U/L (ref 0–37)
Albumin: 4.1 g/dL (ref 3.5–5.2)
BILIRUBIN DIRECT: 0.1 mg/dL (ref 0.0–0.3)
BILIRUBIN TOTAL: 0.7 mg/dL (ref 0.2–1.2)
TOTAL PROTEIN: 7 g/dL (ref 6.0–8.3)

## 2018-05-25 LAB — BASIC METABOLIC PANEL
BUN: 30 mg/dL — AB (ref 6–23)
CO2: 27 mEq/L (ref 19–32)
CREATININE: 1.33 mg/dL — AB (ref 0.40–1.20)
Calcium: 9.1 mg/dL (ref 8.4–10.5)
Chloride: 104 mEq/L (ref 96–112)
GFR: 42.55 mL/min — AB (ref >60.00)
GLUCOSE: 128 mg/dL — AB (ref 70–99)
Potassium: 4.4 mEq/L (ref 3.5–5.1)
Sodium: 140 mEq/L (ref 135–145)

## 2018-05-25 LAB — CBC WITH DIFFERENTIAL/PLATELET
BASOS PCT: 0.7 % (ref 0.0–3.0)
Basophils Absolute: 0 10*3/uL (ref 0.0–0.1)
EOS PCT: 4.6 % (ref 0.0–5.0)
Eosinophils Absolute: 0.3 10*3/uL (ref 0.0–0.7)
HEMATOCRIT: 37.5 % (ref 36.0–46.0)
HEMOGLOBIN: 12.2 g/dL (ref 12.0–15.0)
LYMPHS PCT: 33.2 % (ref 12.0–46.0)
Lymphs Abs: 1.8 10*3/uL (ref 0.7–4.0)
MCHC: 32.5 g/dL (ref 30.0–36.0)
MCV: 90.1 fl (ref 78.0–100.0)
MONOS PCT: 7.1 % (ref 3.0–12.0)
Monocytes Absolute: 0.4 10*3/uL (ref 0.1–1.0)
NEUTROS ABS: 3 10*3/uL (ref 1.4–7.7)
Neutrophils Relative %: 54.4 % (ref 43.0–77.0)
PLATELETS: 244 10*3/uL (ref 150.0–400.0)
RBC: 4.16 Mil/uL (ref 3.87–5.11)
RDW: 14.8 % (ref 11.5–15.5)
WBC: 5.5 10*3/uL (ref 4.0–10.5)

## 2018-05-25 LAB — TSH: TSH: 2.39 u[IU]/mL (ref 0.35–4.50)

## 2018-05-25 LAB — HEMOGLOBIN A1C: Hgb A1c MFr Bld: 6.4 % (ref 4.6–6.5)

## 2018-05-30 ENCOUNTER — Other Ambulatory Visit: Payer: Self-pay | Admitting: Internal Medicine

## 2018-07-25 ENCOUNTER — Other Ambulatory Visit: Payer: Self-pay | Admitting: Internal Medicine

## 2018-08-29 ENCOUNTER — Other Ambulatory Visit: Payer: Self-pay | Admitting: Internal Medicine

## 2018-09-01 ENCOUNTER — Other Ambulatory Visit: Payer: Self-pay | Admitting: Internal Medicine

## 2018-09-02 NOTE — Telephone Encounter (Addendum)
Patient would like a refill on the following two prescriptions until her appt with the provider on 09/08/18 because she only has one day left of the prescriptions, and have them sent to her preferred pharmacy [facility] :    Levothyroxine Sodium 100 MCG TAKE 1 TABLET BY MOUTH ONCE DAILY *NEED MED CHECK*    Lisinopril-hydroCHLOROthiazide 10-12.5 MG TAKE 1 TABLET BY MOUTH ONCE DAILY

## 2018-09-02 NOTE — Addendum Note (Signed)
Addended by: Stevphen Meuse on: 09/02/2018 04:21 PM   Modules accepted: Orders

## 2018-09-03 MED ORDER — LEVOTHYROXINE SODIUM 100 MCG PO TABS: ORAL_TABLET | ORAL | 0 refills | Status: DC

## 2018-09-03 MED ORDER — LISINOPRIL-HYDROCHLOROTHIAZIDE 10-12.5 MG PO TABS: 1.0000 | ORAL_TABLET | Freq: Every day | ORAL | 0 refills | Status: DC

## 2018-09-08 ENCOUNTER — Encounter: Payer: Self-pay | Admitting: Internal Medicine

## 2018-09-08 ENCOUNTER — Ambulatory Visit (INDEPENDENT_AMBULATORY_CARE_PROVIDER_SITE_OTHER): Payer: Self-pay | Admitting: Internal Medicine

## 2018-09-08 VITALS — BP 116/78 | HR 85 | Temp 98.1°F | Wt 244.2 lb

## 2018-09-08 DIAGNOSIS — I10 Essential (primary) hypertension: Secondary | ICD-10-CM

## 2018-09-08 DIAGNOSIS — Z5989 Other problems related to housing and economic circumstances: Secondary | ICD-10-CM

## 2018-09-08 DIAGNOSIS — M25569 Pain in unspecified knee: Secondary | ICD-10-CM

## 2018-09-08 DIAGNOSIS — Z5971 Insufficient health insurance coverage: Secondary | ICD-10-CM

## 2018-09-08 DIAGNOSIS — I119 Hypertensive heart disease without heart failure: Secondary | ICD-10-CM

## 2018-09-08 DIAGNOSIS — E786 Lipoprotein deficiency: Secondary | ICD-10-CM

## 2018-09-08 DIAGNOSIS — R7303 Prediabetes: Secondary | ICD-10-CM

## 2018-09-08 DIAGNOSIS — N289 Disorder of kidney and ureter, unspecified: Secondary | ICD-10-CM

## 2018-09-08 DIAGNOSIS — Z79899 Other long term (current) drug therapy: Secondary | ICD-10-CM

## 2018-09-08 DIAGNOSIS — E039 Hypothyroidism, unspecified: Secondary | ICD-10-CM

## 2018-09-08 DIAGNOSIS — Z598 Other problems related to housing and economic circumstances: Secondary | ICD-10-CM

## 2018-09-08 DIAGNOSIS — E785 Hyperlipidemia, unspecified: Secondary | ICD-10-CM

## 2018-09-08 LAB — POCT GLYCOSYLATED HEMOGLOBIN (HGB A1C): HEMOGLOBIN A1C: 5.7 % — AB (ref 4.0–5.6)

## 2018-09-08 MED ORDER — LEVOTHYROXINE SODIUM 100 MCG PO TABS: ORAL_TABLET | ORAL | 0 refills | Status: DC

## 2018-09-08 MED ORDER — LISINOPRIL-HYDROCHLOROTHIAZIDE 10-12.5 MG PO TABS: 1.0000 | ORAL_TABLET | Freq: Every day | ORAL | 0 refills | Status: DC

## 2018-09-08 MED ORDER — FLUOXETINE HCL 10 MG PO CAPS: ORAL_CAPSULE | ORAL | 0 refills | Status: DC

## 2018-09-08 NOTE — Patient Instructions (Addendum)
Prediabetes .   And  consider medication metformin .   To help  knees pain is prob   Arthritis   And  bursitis   Ok to take tylenol .  dont want you to take advil aleve product   Will do referral to   [facility].   When  [place]  And we can do a referral.  Keep bp in control.    sometime  Injection and or pred can help calm down  Sign up for medicare .    ASAP  With part D   Check hg a1c today .   Is better   Plan rov in 3 months  After on medicare .     Lab Results  Component Value Date   WBC 5.5 05/25/2018   HGB 12.2 05/25/2018   HCT 37.5 05/25/2018   PLT 244.0 05/25/2018   GLUCOSE 128 (H) 05/25/2018   CHOL 177 05/25/2018   TRIG 158.0 (H) 05/25/2018   HDL 38.70 (L) 05/25/2018   LDLCALC 106 (H) 05/25/2018   ALT 8 05/25/2018   AST 14 05/25/2018   NA 140 05/25/2018   K 4.4 05/25/2018   CL 104 05/25/2018   CREATININE 1.33 (H) 05/25/2018   BUN 30 (H) 05/25/2018   CO2 27 05/25/2018   TSH 2.39 05/25/2018   INR 1.16 01/16/2012   HGBA1C 6.4 05/25/2018     Preventing Type 2 Diabetes Mellitus Type 2 diabetes (type 2 diabetes mellitus) is a long-term (chronic) disease that affects blood sugar (glucose) levels. Normally, a hormone called insulin allows glucose to enter cells in the body. The cells use glucose for energy. In type 2 diabetes, one or both of these problems may be present:  The body does not make enough insulin.  The body does not respond properly to insulin that it makes (insulin resistance).  Insulin resistance or lack of insulin causes excess glucose to build up in the blood instead of going into cells. As a result, high blood glucose (hyperglycemia) develops, which can cause many complications. Being overweight or obese and having an inactive (sedentary) lifestyle can increase your risk for diabetes. Type 2 diabetes can be delayed or prevented by making certain nutrition and lifestyle changes. What nutrition changes can be made?  Eat healthy meals and snacks regularly.  Keep a healthy snack with you for when you get hungry between meals, such as fruit or a handful of nuts.  Eat lean meats and proteins that are low in saturated fats, such as chicken, fish, egg whites, and beans. Avoid processed meats.  Eat plenty of fruits and vegetables and plenty of grains that have not been processed (whole grains). It is recommended that you eat: ? 1?2 cups of fruit every day. ? 2?3 cups of vegetables every day. ? 6?8 oz of whole grains every day, such as oats, whole wheat, bulgur, brown rice, quinoa, and millet.  Eat low-fat dairy products, such as milk, yogurt, and cheese.  Eat foods that contain healthy fats, such as nuts, avocado, olive oil, and canola oil.  Drink water throughout the day. Avoid drinks that contain added sugar, such as soda or sweet tea.  Follow instructions from your health care provider about specific eating or drinking restrictions.  Control how much food you eat at a time (portion size). ? Check food labels to find out the serving sizes of foods. ? Use a kitchen scale to weigh amounts of foods.  Saute or steam food instead of frying it. Cook with  water or broth instead of oils or butter.  Limit your intake of: ? Salt (sodium). Have no more than 1 tsp (2,400 mg) of sodium a day. If you have heart disease or high blood pressure, have less than ? tsp (1,500 mg) of sodium a day. ? Saturated fat. This is fat that is solid at room temperature, such as butter or fat on meat. What lifestyle changes can be made?  Activity  Do moderate-intensity physical activity for at least 30 minutes on at least 5 days of the week, or as much as told by your health care provider.  Ask your health care provider what activities are safe for you. A mix of physical activities may be best, such as walking, swimming, cycling, and strength training.  Try to add physical activity into your day. For example: ? Park in spots that are farther away than usual, so that  you walk more. For example, park in a far corner of the parking lot when you go to the office or the grocery store. ? Take a walk during your lunch break. ? Use stairs instead of elevators or escalators. Weight Loss  Lose weight as directed. Your health care provider can determine how much weight loss is best for you and can help you lose weight safely.  If you are overweight or obese, you may be instructed to lose at least 5?7 % of your body weight. Alcohol and Tobacco   Limit alcohol intake to no more than 1 drink a day for nonpregnant women and 2 drinks a day for men. One drink equals 12 oz of beer, 5 oz of wine, or 1 oz of hard liquor.  Do not use any tobacco products, such as cigarettes, chewing tobacco, and e-cigarettes. If you need help quitting, ask your health care provider. Work With Your Health Care Provider  Have your blood glucose tested regularly, as told by your health care provider.  Discuss your risk factors and how you can reduce your risk for diabetes.  Get screening tests as told by your health care provider. You may have screening tests regularly, especially if you have certain risk factors for type 2 diabetes.  Make an appointment with a diet and nutrition specialist (registered dietitian). A registered dietitian can help you make a healthy eating plan and can help you understand portion sizes and food labels. Why are these changes important?  It is possible to prevent or delay type 2 diabetes and related health problems by making lifestyle and nutrition changes.  It can be difficult to recognize signs of type 2 diabetes. The best way to avoid possible damage to your body is to take actions to prevent the disease before you develop symptoms. What can happen if changes are not made?  Your blood glucose levels may keep increasing. Having high blood glucose for a long time is dangerous. Too much glucose in your blood can damage your blood vessels, heart, kidneys,  nerves, and eyes.  You may develop prediabetes or type 2 diabetes. Type 2 diabetes can lead to many chronic health problems and complications, such as: ? Heart disease. ? Stroke. ? Blindness. ? Kidney disease. ? Depression. ? Poor circulation in the feet and legs, which could lead to surgical removal (amputation) in severe cases. Where to find support:  Ask your health care provider to recommend a registered dietitian, diabetes educator, or weight loss program.  Look for local or online weight loss groups.  Join a gym, fitness club, or outdoor  activity group, such as a walking club. Where to find more information: To learn more about diabetes and diabetes prevention, visit:  American Diabetes Association (ADA): www.diabetes.[employer] of Diabetes and Digestive and Kidney Diseases: ToyArticles.ca  To learn more about healthy eating, visit:  The U.S. Department of Agriculture Architect), Choose My Plate: http://yates.biz/  Office of Disease Prevention and Health Promotion (ODPHP), Dietary Guidelines: ListingMagazine.si  Summary  You can reduce your risk for type 2 diabetes by increasing your physical activity, eating healthy foods, and losing weight as directed.  Talk with your health care provider about your risk for type 2 diabetes. Ask about any blood tests or screening tests that you need to have. This information is not intended to replace advice given to you by your health care provider. Make sure you discuss any questions you have with your health care provider. Document Released: 02/18/2016 Document Revised: 04/03/2016 Document Reviewed: 12/18/2015 Elsevier Interactive Patient Education  [employer].

## 2018-09-08 NOTE — Progress Notes (Signed)
Chief Complaint  Patient presents with  . Medication Refill    refill on all Rx's - tolerating all meds well    HPI: Shelby Wong 67 y.o. come in for Chronic disease management  Unfortunately she still has not applied for Medicare because of how complicated it is but plans on doing it. Hypertension taking medicine denies side effect Blood sugar was reported is elevated she is changed her diet and has less sugars.Still do some sugar beverages.   Fruit punch     Knee pain .  Becoming problematic using Tylenol left knee will swell possibly back pain and sciatica.  Has and leg pains  In and out of  Of car     Blue   E mu  .     Thyroid medicine taking regular.  ROS: See pertinent positives and negatives per HPI.  Has upper epigastric pain at times "when her blood pressure is up" but no denial of chest pain shortness of breath like when she had pericarditis.  Past Medical History:  Diagnosis Date  . Allergic rhinitis   . Anxiety   . Depression   . Diverticulosis of colon (without mention of hemorrhage)   . Elevated LFTs   . GERD (gastroesophageal reflux disease)   . Hemorrhoids   . Hiatal hernia   . Hx of echocardiogram    a. Echo 3/14:  Mild LVH, EF 55-65%, normal wall motion, PASP 33  . Hypothyroidism   . IBS (irritable bowel syndrome)   . Myocarditis (HCC)    a. NSTEMI:  12/2011 with peak troponin 2. Cath - mild nonobstructive plaque.;  b. nstemi 01/2012, relook cath - nonobs - MRI sugg of myocarditis  . Obesity   . POLYP, ANAL AND RECTAL 09/07/2007   Qualifier: Diagnosis of  By: Lawernce Ion, CMA (AAMA), Bethann Berkshire   . Pulmonary HTN (HCC)    Enlarged pulmonary artery by CT angio 12/2011 - PAP by echo 12/2011  . Shingles 07/09/2012  . Transaminitis    Abd [place] 12/2011 - hepatic steatosis    Family History  Problem Relation Age of Onset  . Arthritis Mother        osteo  . Breast cancer Mother   . Coronary artery disease Mother   . Other Mother        hearing loss  .  Diabetes Brother        50 s  . Thyroid disease Mother   . Colon cancer Neg Hx     Social History   Socioeconomic History  . Marital status: Divorced    Spouse name: Not on file  . Number of children: 1  . Years of education: Not on file  . Highest education level: Not on file  Occupational History  . Occupation: Chemical engineer Rep  Social Needs  . Financial resource strain: Not on file  . Food insecurity:    Worry: Not on file    Inability: Not on file  . Transportation needs:    Medical: Not on file    Non-medical: Not on file  Tobacco Use  . Smoking status: Former Smoker    Last attempt to quit: 09/11/2006    Years since quitting: 12.0  . Smokeless tobacco: Never Used  Substance and Sexual Activity  . Alcohol use: Yes    Comment: occassional  . Drug use: No  . Sexual activity: Not Currently    Birth control/protection: Post-menopausal  Lifestyle  . Physical activity:    Days  per week: Not on file    Minutes per session: Not on file  . Stress: Not on file  Relationships  . Social connections:    Talks on phone: Not on file    Gets together: Not on file    Attends religious service: Not on file    Active member of club or organization: Not on file    Attends meetings of clubs or organizations: Not on file    Relationship status: Not on file  Other Topics Concern  . 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 acetaminophen (TYLENOL ARTHRITIS PAIN) 650 MG CR tablet Take 1,300 mg by mouth every 12 (twelve) hours.    . Esomeprazole Magnesium (NEXIUM PO) Take by mouth.    . Loratadine (CLARITIN) 10 MG CAPS Take 10 mg by mouth daily as needed.  As needed for sinus    . FLUoxetine (PROZAC) 10 MG capsule TAKE ONE CAPSULE BY MOUTH DAILY **MUST CALL MD FOR APPOINTMENT 90 capsule 0  . levothyroxine (SYNTHROID, LEVOTHROID) 100 MCG tablet TAKE 1 TABLET BY MOUTH ONCE DAILY 90 tablet 0  . lisinopril-hydrochlorothiazide (PRINZIDE,ZESTORETIC) 10-12.5 MG tablet Take 1 tablet by mouth daily. 90 tablet 0   No facility-administered medications prior to visit.      EXAM:  BP 116/78 (BP Location: Left Arm, Patient Position: Sitting, Cuff Size: Large)   Pulse 85   Temp 98.1 F (36.7 C) (Oral)   Wt 244 lb 3.2 oz (110.8 kg)   BMI 41.92 kg/m   Body mass index is 41.92 kg/m.  GENERAL: vitals reviewed and listed above, alert, oriented, appears well hydrated and in no acute distress HEENT: atraumatic, conjunctiva  clear, no obvious abnormalities on inspection of external nose and ears MS: moves all extremities without noticeable focal  abnormality left knee has mild effusion no warmth or redness.  Gait is slightly antalgic but steady. PSYCH: pleasant and cooperative, no obvious depression or anxiety Lab Results  Component Value Date   WBC 5.5 05/25/2018   HGB 12.2 05/25/2018   HCT 37.5 05/25/2018   PLT 244.0 05/25/2018   GLUCOSE 128 (H) 05/25/2018   CHOL 177 05/25/2018   TRIG 158.0 (H) 05/25/2018   HDL 38.70 (L) 05/25/2018   LDLCALC 106 (H) 05/25/2018   ALT 8 05/25/2018   AST 14 05/25/2018   NA 140 05/25/2018   K 4.4 05/25/2018   CL 104 05/25/2018   CREATININE 1.33 (H) 05/25/2018   BUN 30 (H) 05/25/2018   CO2 27 05/25/2018   TSH 2.39 05/25/2018   INR 1.16 01/16/2012   HGBA1C 5.7 (A) 09/08/2018   BP Readings from Last 3 Encounters:  09/08/18 116/78  02/09/18 132/72  10/14/16 120/80   Wt Readings from Last 3 Encounters:  09/08/18 244 lb 3.2 oz (110.8 kg)  02/09/18 248 lb 3.2 oz (112.6 kg)  10/14/16 244 lb 3.2 oz (110.8 kg)   Blood results reviewed from July repeat A1c down to 5.7 today.  ASSESSMENT AND PLAN:  Discussed  the following assessment and plan:  Hypertensive left ventricular hypertrophy,  without heart failure  Medication management - Plan: POC HgB A1c  Essential hypertension, benign  Low HDL (under 40)  Hypothyroidism, unspecified type  Hyperlipidemia, unspecified hyperlipidemia type  Prediabetes - Plan: POC HgB A1c  Knee pain, unspecified chronicity, unspecified laterality  Does not have health insurance  Renal insufficiency Knee pain is most problematic for her at this time she has probable arthritis may be bursitis is not a candidate for anti-inflammatories because of her renal function.  We will plan to refer however she has no insurance.  Have her sign up for Medicare soon as possible and then call us about referral.  Discussed resources to be able to sign up for Medicare part B and a drug plan. Plan follow-up after that.  Welcome to Medicare. -Patient advised to return or notify health care team  if  new concerns arise.  Patient Instructions   Prediabetes .   And  consider medication metformin .   To help  knees pain is prob   Arthritis   And  bursitis   Ok to take tylenol .  dont want you to take advil aleve product   Will do referral to   [facility].   When  [place]  And we can do a referral.  Keep bp in control.    sometime  Injection and or pred can help calm down  Sign up for medicare .    ASAP  With part D   Check hg a1c today .   Is better   Plan rov in 3 months  After on medicare .     Lab Results  Component Value Date   WBC 5.5 05/25/2018   HGB 12.2 05/25/2018   HCT 37.5 05/25/2018   PLT 244.0 05/25/2018   GLUCOSE 128 (H) 05/25/2018   CHOL 177 05/25/2018   TRIG 158.0 (H) 05/25/2018   HDL 38.70 (L) 05/25/2018   LDLCALC 106 (H) 05/25/2018   ALT 8 05/25/2018   AST 14 05/25/2018   NA 140 05/25/2018   K 4.4 05/25/2018   CL 104 05/25/2018   CREATININE 1.33 (H) 05/25/2018   BUN 30 (H) 05/25/2018   CO2 27 05/25/2018   TSH 2.39 05/25/2018   INR 1.16 01/16/2012    HGBA1C 6.4 05/25/2018     Preventing Type 2 Diabetes Mellitus Type 2 diabetes (type 2 diabetes mellitus) is a long-term (chronic) disease that affects blood sugar (glucose) levels. Normally, a hormone called insulin allows glucose to enter cells in the body. The cells use glucose for energy. In type 2 diabetes, one or both of these problems may be present:  The body does not make enough insulin.  The body does not respond properly to insulin that it makes (insulin resistance).  Insulin resistance or lack of insulin causes excess glucose to build up in the blood instead of going into cells. As a result, high blood glucose (hyperglycemia) develops, which can cause many complications. Being overweight or obese and having an inactive (sedentary) lifestyle can increase your risk for diabetes. Type 2 diabetes can be delayed or prevented by making certain nutrition and lifestyle changes. What nutrition changes can be made?  Eat healthy meals and snacks regularly. Keep a healthy snack with you for when you get hungry between meals, such as fruit or a handful of nuts.  Eat lean meats and proteins that are low in saturated fats, such as chicken, fish, egg whites, and beans. Avoid processed meats.  Eat plenty of fruits and vegetables and plenty  of grains that have not been processed (whole grains). It is recommended that you eat: ? 1?2 cups of fruit every day. ? 2?3 cups of vegetables every day. ? 6?8 oz of whole grains every day, such as oats, whole wheat, bulgur, brown rice, quinoa, and millet.  Eat low-fat dairy products, such as milk, yogurt, and cheese.  Eat foods that contain healthy fats, such as nuts, avocado, olive oil, and canola oil.  Drink water throughout the day. Avoid drinks that contain added sugar, such as soda or sweet tea.  Follow instructions from your health care provider about specific eating or drinking restrictions.  Control how much food you eat at a time (portion  size). ? Check food labels to find out the serving sizes of foods. ? Use a kitchen scale to weigh amounts of foods.  Saute or steam food instead of frying it. Cook with water or broth instead of oils or butter.  Limit your intake of: ? Salt (sodium). Have no more than 1 tsp (2,400 mg) of sodium a day. If you have heart disease or high blood pressure, have less than ? tsp (1,500 mg) of sodium a day. ? Saturated fat. This is fat that is solid at room temperature, such as butter or fat on meat. What lifestyle changes can be made?  Activity  Do moderate-intensity physical activity for at least 30 minutes on at least 5 days of the week, or as much as told by your health care provider.  Ask your health care provider what activities are safe for you. A mix of physical activities may be best, such as walking, swimming, cycling, and strength training.  Try to add physical activity into your day. For example: ? Park in spots that are farther away than usual, so that you walk more. For example, park in a far corner of the parking lot when you go to the office or the grocery store. ? Take a walk during your lunch break. ? Use stairs instead of elevators or escalators. Weight Loss  Lose weight as directed. Your health care provider can determine how much weight loss is best for you and can help you lose weight safely.  If you are overweight or obese, you may be instructed to lose at least 5?7 % of your body weight. Alcohol and Tobacco   Limit alcohol intake to no more than 1 drink a day for nonpregnant women and 2 drinks a day for men. One drink equals 12 oz of beer, 5 oz of wine, or 1 oz of hard liquor.  Do not use any tobacco products, such as cigarettes, chewing tobacco, and e-cigarettes. If you need help quitting, ask your health care provider. Work With Your Health Care Provider  Have your blood glucose tested regularly, as told by your health care provider.  Discuss your risk factors  and how you can reduce your risk for diabetes.  Get screening tests as told by your health care provider. You may have screening tests regularly, especially if you have certain risk factors for type 2 diabetes.  Make an appointment with a diet and nutrition specialist (registered dietitian). A registered dietitian can help you make a healthy eating plan and can help you understand portion sizes and food labels. Why are these changes important?  It is possible to prevent or delay type 2 diabetes and related health problems by making lifestyle and nutrition changes.  It can be difficult to recognize signs of type 2 diabetes. The best way  to avoid possible damage to your body is to take actions to prevent the disease before you develop symptoms. What can happen if changes are not made?  Your blood glucose levels may keep increasing. Having high blood glucose for a long time is dangerous. Too much glucose in your blood can damage your blood vessels, heart, kidneys, nerves, and eyes.  You may develop prediabetes or type 2 diabetes. Type 2 diabetes can lead to many chronic health problems and complications, such as: ? Heart disease. ? Stroke. ? Blindness. ? Kidney disease. ? Depression. ? Poor circulation in the feet and legs, which could lead to surgical removal (amputation) in severe cases. Where to find support:  Ask your health care provider to recommend a registered dietitian, diabetes educator, or weight loss program.  Look for local or online weight loss groups.  Join a gym, fitness club, or outdoor activity group, such as a walking club. Where to find more information: To learn more about diabetes and diabetes prevention, visit:  American Diabetes Association (ADA): www.diabetes.[employer] of Diabetes and Digestive and Kidney Diseases: ToyArticles.ca  To learn more about healthy eating, visit:  The U.S. Department of Agriculture  Architect), Choose My Plate: http://yates.biz/  Office of Disease Prevention and Health Promotion (ODPHP), Dietary Guidelines: ListingMagazine.si  Summary  You can reduce your risk for type 2 diabetes by increasing your physical activity, eating healthy foods, and losing weight as directed.  Talk with your health care provider about your risk for type 2 diabetes. Ask about any blood tests or screening tests that you need to have. This information is not intended to replace advice given to you by your health care provider. Make sure you discuss any questions you have with your health care provider. Document Released: 02/18/2016 Document Revised: 04/03/2016 Document Reviewed: 12/18/2015 Elsevier Interactive Patient Education  2018 [employer].                Bolan. Panosh M.D.

## 2018-10-22 ENCOUNTER — Other Ambulatory Visit: Payer: Self-pay | Admitting: Internal Medicine

## 2019-01-23 ENCOUNTER — Other Ambulatory Visit: Payer: Self-pay | Admitting: Internal Medicine

## 2019-02-26 ENCOUNTER — Other Ambulatory Visit: Payer: Self-pay | Admitting: Internal Medicine

## 2019-02-28 ENCOUNTER — Other Ambulatory Visit: Payer: Self-pay | Admitting: Internal Medicine

## 2019-03-02 ENCOUNTER — Other Ambulatory Visit: Payer: Self-pay | Admitting: Internal Medicine

## 2019-04-23 ENCOUNTER — Other Ambulatory Visit: Payer: Self-pay | Admitting: Internal Medicine

## 2019-07-18 ENCOUNTER — Other Ambulatory Visit: Payer: Self-pay | Admitting: Internal Medicine

## 2019-07-19 NOTE — Progress Notes (Signed)
Virtual Visit via Video Note  I connected with@ on 07/20/19 at 11:30 AM EDT by a video enabled telemedicine application and verified that I am speaking with the correct person using two identifiers. Location patient: home Location provider:work  office Persons participating in the virtual visit: patient, provider  WIth national recommendations  regarding COVID 19 pandemic   video visit is advised over in office visit for this patient.  Patient aware  of the limitations of evaluation and management by telemedicine and  availability of in person appointments. and agreed to proceed.   HPI: Shelby Wong presents for video visit last visit   Oct 2019 and last lab July 2019   Has hypothyroid, ht  Hd , hyperglycemia and knee  Pain .  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  Needs refill ht med at this time  .adheranct to medical plan .  Neg tad  Some sugar drinks  Sleep is good  Not a lot of activity  Knee and isolation Mood is good on the low dose prozac at this time . Was waiting to get on medicare to get evaluations done and hcm issues   ROS: See pertinent positives and negatives per HPI. No current cp sob falling vision hearing new sx fever  Etc .    Past Medical History:  Diagnosis Date  . Allergic rhinitis   . Anxiety   . Depression   . Diverticulosis of colon (without mention of hemorrhage)   . Elevated LFTs   . GERD (gastroesophageal reflux disease)   . Hemorrhoids   . Hiatal hernia   . Hx of echocardiogram    a. Echo 3/14:  Mild LVH, EF 55-65%, normal wall motion, PASP 33  . Hypothyroidism   . IBS (irritable bowel syndrome)   . Myocarditis ([place])    a. NSTEMI:  12/2011 with peak troponin 2. Cath - mild nonobstructive plaque.;  b. nstemi 01/2012, relook cath - nonobs - MRI sugg of myocarditis  . Obesity   . POLYP, ANAL AND RECTAL 09/07/2007   Qualifier: Diagnosis of  By: Hulan Saas, CMA (AAMA), Quita Skye   .  Pulmonary HTN ([place])    Enlarged pulmonary artery by CT angio 12/2011 - PAP 28mmHg by echo 12/2011  . Shingles 07/09/2012  . Transaminitis    Abd [place] 12/2011 - hepatic steatosis    Past Surgical History:  Procedure Laterality Date  . CHOLECYSTECTOMY    . LEFT AND RIGHT HEART CATHETERIZATION WITH CORONARY ANGIOGRAM N/A 01/16/2012   Procedure: LEFT AND RIGHT HEART CATHETERIZATION WITH CORONARY ANGIOGRAM;  Surgeon: Josue Hector, MD;  Location[facility] CATH LAB;  Service: Cardiovascular;  Laterality: N/A;  . LEFT HEART CATHETERIZATION WITH CORONARY ANGIOGRAM N/A 12/15/2011   Procedure: LEFT HEART CATHETERIZATION WITH CORONARY ANGIOGRAM;  Surgeon: Peter M Martinique, MD;  Location: [facility] CATH LAB;  Service: Cardiovascular;  Laterality: N/A;  . TONSILLECTOMY AND ADENOIDECTOMY      Family History  Problem Relation Age of Onset  . Arthritis Mother        osteo  . Breast cancer Mother   . Coronary artery disease Mother   . Other Mother        hearing loss  . Diabetes Brother        74 s  . Thyroid disease Mother   . Colon cancer Neg Hx     Social History   Tobacco Use  . Smoking  status: Former Smoker    Quit date: 09/11/2006    Years since quitting: 12.8  . Smokeless tobacco: Never Used  Substance Use Topics  . Alcohol use: Yes    Comment: occassional  . Drug use: No      Current Outpatient Medications:  .  acetaminophen (TYLENOL ARTHRITIS PAIN) 650 MG CR tablet, Take 1,300 mg by mouth every 12 (twelve) hours., Disp: , Rfl:  .  Esomeprazole Magnesium (NEXIUM PO), Take by mouth., Disp: , Rfl:  .  FLUoxetine (PROZAC) 10 MG capsule, TAKE ONE CAPSULE BY MOUTH DAILY, Disp: 90 capsule, Rfl: 0 .  levothyroxine (SYNTHROID) 100 MCG tablet, Take 1 tablet by mouth once daily, Disp: 90 tablet, Rfl: 0 .  lisinopril-hydrochlorothiazide (ZESTORETIC) 10-12.5 MG tablet, Take 1 tablet by mouth daily., Disp: 90 tablet, Rfl: 0 .  Loratadine (CLARITIN) 10 MG CAPS, Take 10 mg by mouth daily as needed. As needed for  sinus, Disp: , Rfl:   EXAM: BP Readings from Last 3 Encounters:  09/08/18 116/78  02/09/18 132/72  10/14/16 120/80    VITALS per patient if applicable:  Not avaialbe says ok may have gained weight in covid time  GENERAL: alert, oriented, appears well and in no acute distress  HEENT: atraumatic, conjunttiva clear, no obvious abnormalities on inspection of external nose and ears  NECK: normal movements of the head and neck  LUNGS: on inspection no signs of respiratory distress, breathing rate appears normal, no obvious gross SOB, gasping or wheezing CV: no obvious cyanosis   PSYCH/NEURO: pleasant and cooperative, no obvious depression or anxiety, speech and thought processing grossly intact Lab Results  Component Value Date   WBC 5.5 05/25/2018   HGB 12.2 05/25/2018   HCT 37.5 05/25/2018   PLT 244.0 05/25/2018   GLUCOSE 128 (H) 05/25/2018   CHOL 177 05/25/2018   TRIG 158.0 (H) 05/25/2018   HDL 38.70 (L) 05/25/2018   LDLCALC 106 (H) 05/25/2018   ALT 8 05/25/2018   AST 14 05/25/2018   NA 140 05/25/2018   K 4.4 05/25/2018   CL 104 05/25/2018   CREATININE 1.33 (H) 05/25/2018   BUN 30 (H) 05/25/2018   CO2 27 05/25/2018   TSH 2.39 05/25/2018   INR 1.16 01/16/2012   HGBA1C 5.7 (A) 09/08/2018    ASSESSMENT AND PLAN:  Discussed the following assessment and plan:    ICD-10-CM   1. Hypertensive left ventricular hypertrophy, without heart failure  I11.9 Basic metabolic panel    CBC with Differential/Platelet    Hemoglobin A1c    Hepatic function panel    Lipid panel    TSH  2. Medication management  Z79.899 Basic metabolic panel    CBC with Differential/Platelet    Hemoglobin A1c    Hepatic function panel    Lipid panel    TSH  3. Essential hypertension, benign  I10 Basic metabolic panel    CBC with Differential/Platelet    Hemoglobin A1c    Hepatic function panel    Lipid panel    TSH  4. Hypothyroidism, unspecified type  E03.9 Basic metabolic panel    CBC  with Differential/Platelet    Hemoglobin A1c    Hepatic function panel    Lipid panel    TSH  5. Low HDL (under 40)  E78.6 Basic metabolic panel    CBC with Differential/Platelet    Hemoglobin A1c    Hepatic function panel    Lipid panel    TSH  6. Hyperglycemia  R73.9 Basic  metabolic panel    CBC with Differential/Platelet    Hemoglobin A1c    Hepatic function panel    Lipid panel    TSH  7. Hyperlipidemia, unspecified hyperlipidemia type  E78.5 Basic metabolic panel    CBC with Differential/Platelet    Hemoglobin A1c    Hepatic function panel    Lipid panel    TSH  8. Need for hepatitis C screening test  Z11.59 Hepatitis C antibody   Benefit more than risk of medications  to continue. Plan labs and then  Visit  In person in future    Now has medicare and she will address delayed  Services .  Counseled. About healthy weight strategies .  Expectant management and discussion of plan and treatment with opportunity to ask questions and all were answered. The patient agreed with the plan and demonstrated an understanding of the instructions.   Advised to call back or seek an in-person evaluation if worsening  or having  further concerns . In interim     Berniece AndreasWanda , MD

## 2019-07-20 ENCOUNTER — Encounter: Payer: Self-pay | Admitting: Internal Medicine

## 2019-07-20 ENCOUNTER — Telehealth (INDEPENDENT_AMBULATORY_CARE_PROVIDER_SITE_OTHER): Payer: Medicare Other | Admitting: Internal Medicine

## 2019-07-20 ENCOUNTER — Other Ambulatory Visit: Payer: Self-pay

## 2019-07-20 DIAGNOSIS — Z79899 Other long term (current) drug therapy: Secondary | ICD-10-CM

## 2019-07-20 DIAGNOSIS — E786 Lipoprotein deficiency: Secondary | ICD-10-CM

## 2019-07-20 DIAGNOSIS — R739 Hyperglycemia, unspecified: Secondary | ICD-10-CM

## 2019-07-20 DIAGNOSIS — I10 Essential (primary) hypertension: Secondary | ICD-10-CM | POA: Diagnosis not present

## 2019-07-20 DIAGNOSIS — E039 Hypothyroidism, unspecified: Secondary | ICD-10-CM | POA: Diagnosis not present

## 2019-07-20 DIAGNOSIS — E785 Hyperlipidemia, unspecified: Secondary | ICD-10-CM

## 2019-07-20 DIAGNOSIS — I119 Hypertensive heart disease without heart failure: Secondary | ICD-10-CM

## 2019-07-20 DIAGNOSIS — Z1159 Encounter for screening for other viral diseases: Secondary | ICD-10-CM

## 2019-07-20 MED ORDER — LISINOPRIL-HYDROCHLOROTHIAZIDE 10-12.5 MG PO TABS: 1.0000 | ORAL_TABLET | Freq: Every day | ORAL | 0 refills | Status: DC

## 2019-07-20 NOTE — Telephone Encounter (Signed)
Spoke with pt she has virtual today. She states pharmacy did not give full 90 last time

## 2019-07-25 ENCOUNTER — Other Ambulatory Visit: Payer: Self-pay | Admitting: Internal Medicine

## 2019-08-10 ENCOUNTER — Other Ambulatory Visit: Payer: Self-pay

## 2019-08-10 ENCOUNTER — Other Ambulatory Visit (INDEPENDENT_AMBULATORY_CARE_PROVIDER_SITE_OTHER): Payer: Medicare Other

## 2019-08-10 DIAGNOSIS — Z1159 Encounter for screening for other viral diseases: Secondary | ICD-10-CM

## 2019-08-10 DIAGNOSIS — I119 Hypertensive heart disease without heart failure: Secondary | ICD-10-CM | POA: Diagnosis not present

## 2019-08-10 DIAGNOSIS — I10 Essential (primary) hypertension: Secondary | ICD-10-CM

## 2019-08-10 DIAGNOSIS — E039 Hypothyroidism, unspecified: Secondary | ICD-10-CM

## 2019-08-10 DIAGNOSIS — Z79899 Other long term (current) drug therapy: Secondary | ICD-10-CM

## 2019-08-10 DIAGNOSIS — R739 Hyperglycemia, unspecified: Secondary | ICD-10-CM

## 2019-08-10 DIAGNOSIS — E786 Lipoprotein deficiency: Secondary | ICD-10-CM | POA: Diagnosis not present

## 2019-08-10 DIAGNOSIS — E785 Hyperlipidemia, unspecified: Secondary | ICD-10-CM

## 2019-08-10 LAB — CBC WITH DIFFERENTIAL/PLATELET
Basophils Absolute: 0 10*3/uL (ref 0.0–0.1)
Basophils Relative: 0.7 % (ref 0.0–3.0)
Eosinophils Absolute: 0.2 10*3/uL (ref 0.0–0.7)
Eosinophils Relative: 3.4 % (ref 0.0–5.0)
HCT: 36.4 % (ref 36.0–46.0)
Hemoglobin: 11.8 g/dL — ABNORMAL LOW (ref 12.0–15.0)
Lymphocytes Relative: 40.1 % (ref 12.0–46.0)
Lymphs Abs: 2.3 10*3/uL (ref 0.7–4.0)
MCHC: 32.4 g/dL (ref 30.0–36.0)
MCV: 93 fl (ref 78.0–100.0)
Monocytes Absolute: 0.4 10*3/uL (ref 0.1–1.0)
Monocytes Relative: 6.4 % (ref 3.0–12.0)
Neutro Abs: 2.9 10*3/uL (ref 1.4–7.7)
Neutrophils Relative %: 49.4 % (ref 43.0–77.0)
Platelets: 245 10*3/uL (ref 150.0–400.0)
RBC: 3.91 Mil/uL (ref 3.87–5.11)
RDW: 13.9 % (ref 11.5–15.5)
WBC: 5.8 10*3/uL (ref 4.0–10.5)

## 2019-08-10 LAB — HEPATIC FUNCTION PANEL
ALT: 9 U/L (ref 0–35)
AST: 18 U/L (ref 0–37)
Albumin: 4 g/dL (ref 3.5–5.2)
Alkaline Phosphatase: 63 U/L (ref 39–117)
Bilirubin, Direct: 0.2 mg/dL (ref 0.0–0.3)
Total Bilirubin: 0.7 mg/dL (ref 0.2–1.2)
Total Protein: 6.6 g/dL (ref 6.0–8.3)

## 2019-08-10 LAB — BASIC METABOLIC PANEL
BUN: 23 mg/dL (ref 6–23)
CO2: 27 mEq/L (ref 19–32)
Calcium: 9 mg/dL (ref 8.4–10.5)
Chloride: 105 mEq/L (ref 96–112)
Creatinine, Ser: 1.17 mg/dL (ref 0.40–1.20)
GFR: 46.24 mL/min — ABNORMAL LOW (ref >60.00)
Glucose, Bld: 108 mg/dL — ABNORMAL HIGH (ref 70–99)
Potassium: 3.9 mEq/L (ref 3.5–5.1)
Sodium: 141 mEq/L (ref 135–145)

## 2019-08-10 LAB — LIPID PANEL
Cholesterol: 169 mg/dL (ref 0–200)
HDL: 41.4 mg/dL (ref >39.00)
LDL Cholesterol: 99 mg/dL (ref 0–99)
NonHDL: 127.21
Total CHOL/HDL Ratio: 4
Triglycerides: 143 mg/dL (ref 0.0–149.0)
VLDL: 28.6 mg/dL (ref 0.0–40.0)

## 2019-08-10 LAB — HEMOGLOBIN A1C: Hgb A1c MFr Bld: 6.2 % (ref 4.6–6.5)

## 2019-08-10 LAB — TSH: TSH: 1.53 u[IU]/mL (ref 0.35–4.50)

## 2019-08-11 LAB — HEPATITIS C ANTIBODY
Hepatitis C Ab: NONREACTIVE
SIGNAL TO CUT-OFF: 0.01 (ref <1.00)

## 2019-08-30 ENCOUNTER — Other Ambulatory Visit: Payer: Self-pay | Admitting: Internal Medicine

## 2019-09-07 NOTE — Progress Notes (Signed)
Chief Complaint  Patient presents with  . Annual Exam    HPI: Shelby Wong 68 y.o. comes in today for yearly visit   Med eval Since last visit   bp has been fine . Funny feeling when bp.  Mid chest but no angina  ( hx of  No large vascular disease in past)  No sob new sx bleeding   Denies sleep apnea   But does snore  hasnt gotten mammo or colon yet  Delayed .  Trying to eat less sugars   Better   Not always   Health Maintenance  Topic Date Due  . MAMMOGRAM  05/01/2003  . COLONOSCOPY  01/30/2018  . DEXA SCAN  04/30/2018  . PNA vac Low Risk Adult (1 of 2 - PCV13) 04/30/2018  . TETANUS/TDAP  04/01/2020  . INFLUENZA VACCINE  Completed  . Hepatitis C Screening  Completed   Health Maintenance Review LIFESTYLE:  Exercise:   Walking  Tobacco/ETS:n Alcohol: n Sugar beverages: Sleep:6+  Drug use: no HH: with brother   To have grand child in December ( [place] area)     Hearing:  Ok   Vision:  No limitations at present . Last eye check delayed  needs new  Glasses   Safety:  Has smoke detector and wears seat belts.  . No excess sun exposure. Sees dentist regularly.  Falls: n  Memory: Felt to be good  , no concern from her or her family.  Depression: No anhedonia unusual crying or depressive symptoms  Nutrition: Eats well balanced diet; adequate calcium and vitamin D. No swallowing chewing problems.  Injury: no major injuries in the last six months.  Other healthcare providers:  Reviewed today .  Preventive parameters: up-to-date  Reviewed   ADLS:   There are no problems or need for assistance  driving, feeding, obtaining food, dressing, toileting and bathing, managing money using phone. She is independent.    ROS:  GEN/ HEENT: No fever, significant weight changes sweats headaches vision problems hearing changes, CV/ PULM; No chest pain shortness of breath cough, syncope,edema  change in exercise tolerance. GI /GU: No adominal pain, vomiting, change in bowel habits.  No blood in the stool. No significant GU symptoms. SKIN/HEME: ,no acute skin rashes suspicious lesions or bleeding. No lymphadenopathy, nodules, masses.  NEURO/ PSYCH:  No neurologic signs such as weakness numbness. No depression anxiety. IMM/ Allergy: No unusual infections.  Allergy .   REST of 12 system review negative except as per HPI   Past Medical History:  Diagnosis Date  . Allergic rhinitis   . Anxiety   . Depression   . Diverticulosis of colon (without mention of hemorrhage)   . Elevated LFTs   . GERD (gastroesophageal reflux disease)   . Hemorrhoids   . Hiatal hernia   . Hx of echocardiogram    a. Echo 3/14:  Mild LVH, EF 55-65%, normal wall motion, PASP 33  . Hypothyroidism   . IBS (irritable bowel syndrome)   . Myocarditis ([place])    a. NSTEMI:  12/2011 with peak troponin 2. Cath - mild nonobstructive plaque.;  b. nstemi 01/2012, relook cath - nonobs - MRI sugg of myocarditis  . Obesity   . POLYP, ANAL AND RECTAL 09/07/2007   Qualifier: Diagnosis of  By: Hulan Saas, CMA (AAMA), [place]   . Pulmonary HTN ([place])    Enlarged pulmonary artery by CT angio 12/2011 - PAP 68mmHg by echo 12/2011  . Shingles 07/09/2012  . Transaminitis  Abd US 12/2011 - hepatic steatosis    Family History  Problem Relation Age of Onset  . Arthritis Mother        osteo  . Breast cancer Mother   . Coronary artery disease Mother   . Other Mother        hearing loss  . Diabetes Brother        50 s  . Thyroid disease Mother   . Colon cancer Neg Hx     Social History   Socioeconomic History  . Marital status: Divorced    Spouse name: Not on file  . Number of children: 1  . Years of education: Not on file  . Highest education level: Not on file  Occupational History  . Occupation: Chemical engineerDirect Sales Rep  Social Needs  . Financial resource strain: Not on file  . Food insecurity    Worry: Not on file    Inability: Not on file  . Transportation needs    Medical: Not on file    Non-medical:  Not on file  Tobacco Use  . Smoking status: Former Smoker    Quit date: 09/11/2006    Years since quitting: 13.0  . Smokeless tobacco: Never Used  Substance and Sexual Activity  . Alcohol use: Yes    Comment: occassional  . Drug use: No  . Sexual activity: Not Currently    Birth control/protection: Post-menopausal  Lifestyle  . Physical activity    Days per week: Not on file    Minutes per session: Not on file  . Stress: Not on file  Relationships  . Social Musicianconnections    Talks on phone: Not on file    Gets together: Not on file    Attends religious service: Not on file    Active member of club or organization: Not on file    Attends meetings of clubs or organizations: Not on file    Relationship status: Not on file  Other Topics Concern  . 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 acetaminophen (TYLENOL ARTHRITIS PAIN) 650 MG CR tablet Take 1,300 mg by mouth every 12 (twelve) hours.  . Esomeprazole Magnesium (NEXIUM PO) Take by mouth.  Marland Kitchen. FLUoxetine (PROZAC) 10 MG capsule TAKE ONE CAPSULE BY MOUTH DAILY  . levothyroxine (SYNTHROID) 100 MCG tablet Take 1 tablet by mouth once daily  . lisinopril-hydrochlorothiazide (ZESTORETIC) 10-12.5 MG tablet Take 1 tablet by mouth daily.  . Loratadine (CLARITIN) 10 MG CAPS Take 10 mg by mouth daily as needed. As needed for sinus   No facility-administered encounter medications on file as of 09/09/2019.     EXAM:  BP 126/74 (BP Location: Right Arm, Patient Position: Sitting, Cuff Size: Normal)   Pulse 84   Temp 97.8 F (36.6 C) (Temporal)   Ht 5\' 4"  (1.626 m)   Wt 248 lb 3.2 oz (112.6 kg)    SpO2 98%   BMI 42.60 kg/m   Body mass index is 42.6 kg/m.  Physical Exam: Vital signs reviewed VWU:[id number]:This is  a well-developed well-nourished alert cooperative   who appears stated age in no acute distress.  HEENT: normocephalic atraumatic , Eyes: PERRL EOM's full, conjunctiva clear, Nares: paten,t no deformity discharge or tenderness., Ears: no deformity EAC's clear TMs with normal landmarks.  OP, masked NECK: supple without masses, thyromegaly or bruits. CHEST/PULM:  Clear to auscultation and percussion breath sounds equal no wheeze , rales or rhonchi. No chest wall deformities or tenderness. CV: PMI is nondisplaced, S1 S2 no gallops, murmurs, rubs. Peripheral pulses are full without delay.No JVD . Breast: normal by inspection . No dimpling, discharge, masses, tenderness or discharge . ABDOMEN: Bowel sounds normal nontender  No guard or rebound, no hepato splenomegal no CVA tenderness.   Extremtities:  No clubbing cyanosis or edema, no acute joint swelling or redness no focal atrophy NEURO:  Oriented x3, cranial nerves 3-12 appear to be intact, no obvious focal weakness,gait within normal limits no abnormal reflexes or asymmetrical SKIN: No acute rashes normal turgor, color, no bruising or petechiae. PSYCH: Oriented, good eye contact, no obvious depression anxiety, cognition and judgment appear normal. LN: no cervical axillary inguinal adenopathy No noted deficits in memory, attention, and speech.   Lab Results  Component Value Date   WBC 5.8 08/10/2019   HGB 11.8 (L) 08/10/2019   HCT 36.4 08/10/2019   PLT 245.0 08/10/2019   GLUCOSE 108 (H) 08/10/2019   CHOL 169 08/10/2019   TRIG 143.0 08/10/2019   HDL 41.40 08/10/2019   LDLCALC 99 08/10/2019   ALT 9 08/10/2019   AST 18 08/10/2019   NA 141 08/10/2019   K 3.9 08/10/2019   CL 105 08/10/2019   CREATININE 1.17 08/10/2019   BUN 23 08/10/2019   CO2 27 08/10/2019   TSH 1.53 08/10/2019   INR 1.16 01/16/2012   HGBA1C 6.2  08/10/2019    ASSESSMENT AND PLAN:  Discussed the following assessment and plan:  Hypertensive left ventricular hypertrophy, without heart failure  Medication management  Hyperlipidemia, unspecified hyperlipidemia type  Hyperglycemia - Plan: Hemoglobin A1c, CBC with Differential/Platelet  Mild anemia - Plan: Hemoglobin A1c, CBC with Differential/Platelet  Estrogen deficiency - get dexa scan  - Plan: DG Bone Density  Morbid obesity, unspecified obesity type (HCC) - modest weight loss will help   to continue cutting out sugars etc  Low grade anemia   Borderline   Hx of same   Need  Fu labs   Get her  Colon and  Mammo( mom had breast cancer  And she may have had polyps at some point) Lipids better    bp ok  prediabetic  Labs  Plan fu in  4-6 mos labs   And yearly check otherwise  Thyroid onrange    Patient Care Team: Madelin Headings, MD as PCP - General Mardella Layman, MD (Gastroenterology) Tonny Bollman, MD (Cardiology)  Patient Instructions  Continue lifestyle intervention healthy eating and exercise .  To  Avoid getting diabetes .  Cutting out sugars and simple carbs.   The borderline  Hg   May be normal for you  But will recheck in 3-4 months .  Thyroid in range . get your mammogram and colonoscopy    Bone densisty when convenient . Call for appt with  elam radiiology       Preventing Type 2 Diabetes Mellitus Type 2 diabetes (type 2 diabetes mellitus) is a long-term (chronic) disease that affects blood sugar (glucose) levels. Normally, a hormone called insulin allows glucose to enter cells in the body.  The cells use glucose for energy. In type 2 diabetes, one or both of these problems may be present:  The body does not make enough insulin.  The body does not respond properly to insulin that it makes (insulin resistance). Insulin resistance or lack of insulin causes excess glucose to build up in the blood instead of going into cells. As a result, high blood  glucose (hyperglycemia) develops, which can cause many complications. Being overweight or obese and having an inactive (sedentary) lifestyle can increase your risk for diabetes. Type 2 diabetes can be delayed or prevented by making certain nutrition and lifestyle changes. What nutrition changes can be made?   Eat healthy meals and snacks regularly. Keep a healthy snack with you for when you get hungry between meals, such as fruit or a handful of nuts.  Eat lean meats and proteins that are low in saturated fats, such as chicken, fish, egg whites, and beans. Avoid processed meats.  Eat plenty of fruits and vegetables and plenty of grains that have not been processed (whole grains). It is recommended that you eat: ? 1?2 cups of fruit every day. ? 2?3 cups of vegetables every day. ? 6?8 oz of whole grains every day, such as oats, whole wheat, bulgur, brown rice, quinoa, and millet.  Eat low-fat dairy products, such as milk, yogurt, and cheese.  Eat foods that contain healthy fats, such as nuts, avocado, olive oil, and canola oil.  Drink water throughout the day. Avoid drinks that contain added sugar, such as soda or sweet tea.  Follow instructions from your health care provider about specific eating or drinking restrictions.  Control how much food you eat at a time (portion size). ? Check food labels to find out the serving sizes of foods. ? Use a kitchen scale to weigh amounts of foods.  Saute or steam food instead of frying it. Cook with water or broth instead of oils or butter.  Limit your intake of: ? Salt (sodium). Have no more than 1 tsp (2,400 mg) of sodium a day. If you have heart disease or high blood pressure, have less than ? tsp (1,500 mg) of sodium a day. ? Saturated fat. This is fat that is solid at room temperature, such as butter or fat on meat. What lifestyle changes can be made? Activity   Do moderate-intensity physical activity for at least 30 minutes on at least  5 days of the week, or as much as told by your health care provider.  Ask your health care provider what activities are safe for you. A mix of physical activities may be best, such as walking, swimming, cycling, and strength training.  Try to add physical activity into your day. For example: ? Park in spots that are farther away than usual, so that you walk more. For example, park in a far corner of the parking lot when you go to the office or the grocery store. ? Take a walk during your lunch break. ? Use stairs instead of elevators or escalators. Weight Loss  Lose weight as directed. Your health care provider can determine how much weight loss is best for you and can help you lose weight safely.  If you are overweight or obese, you may be instructed to lose at least 5?7 % of your body weight. Alcohol and Tobacco   Limit alcohol intake to no more than 1 drink a day for nonpregnant women and 2 drinks a day for men. One drink equals 12 oz  of beer, 5 oz of wine, or 1 oz of hard liquor.  Do not use any tobacco products, such as cigarettes, chewing tobacco, and e-cigarettes. If you need help quitting, ask your health care provider. Work With Your Health Care Provider  Have your blood glucose tested regularly, as told by your health care provider.  Discuss your risk factors and how you can reduce your risk for diabetes.  Get screening tests as told by your health care provider. You may have screening tests regularly, especially if you have certain risk factors for type 2 diabetes.  Make an appointment with a diet and nutrition specialist (registered dietitian). A registered dietitian can help you make a healthy eating plan and can help you understand portion sizes and food labels. Why are these changes important?  It is possible to prevent or delay type 2 diabetes and related health problems by making lifestyle and nutrition changes.  It can be difficult to recognize signs of type 2  diabetes. The best way to avoid possible damage to your body is to take actions to prevent the disease before you develop symptoms. What can happen if changes are not made?  Your blood glucose levels may keep increasing. Having high blood glucose for a long time is dangerous. Too much glucose in your blood can damage your blood vessels, heart, kidneys, nerves, and eyes.  You may develop prediabetes or type 2 diabetes. Type 2 diabetes can lead to many chronic health problems and complications, such as: ? Heart disease. ? Stroke. ? Blindness. ? Kidney disease. ? Depression. ? Poor circulation in the feet and legs, which could lead to surgical removal (amputation) in severe cases. Where to find support  Ask your health care provider to recommend a registered dietitian, diabetes educator, or weight loss program.  Look for local or online weight loss groups.  Join a gym, fitness club, or outdoor activity group, such as a walking club. Where to find more information To learn more about diabetes and diabetes prevention, visit:  American Diabetes Association (ADA): www.diabetes.[employer] of Diabetes and Digestive and Kidney Diseases: ToyArticles.ca To learn more about healthy eating, visit:  The U.S. Department of Agriculture Architect), Choose My Plate: http://yates.biz/  Office of Disease Prevention and Health Promotion (ODPHP), Dietary Guidelines: ListingMagazine.si Summary  You can reduce your risk for type 2 diabetes by increasing your physical activity, eating healthy foods, and losing weight as directed.  Talk with your health care provider about your risk for type 2 diabetes. Ask about any blood tests or screening tests that you need to have. This information is not intended to replace advice given to you by your health care provider. Make sure you discuss any questions you have with your health care provider.  Document Released: 02/18/2016 Document Revised: 02/18/2019 Document Reviewed: 12/18/2015 Elsevier Patient Education  2020 [employer].   Health Maintenance, Female Adopting a healthy lifestyle and getting preventive care are important in promoting health and wellness. Ask your health care provider about:  The right schedule for you to have regular tests and exams.  Things you can do on your own to prevent diseases and keep yourself healthy. What should I know about diet, weight, and exercise? Eat a healthy diet   Eat a diet that includes plenty of vegetables, fruits, low-fat dairy products, and lean protein.  Do not eat a lot of foods that are high in solid fats, added sugars, or sodium. Maintain a healthy weight Body mass index (BMI) is  used to identify weight problems. It estimates body fat based on height and weight. Your health care provider can help determine your BMI and help you achieve or maintain a healthy weight. Get regular exercise Get regular exercise. This is one of the most important things you can do for your health. Most adults should:  Exercise for at least 150 minutes each week. The exercise should increase your heart rate and make you sweat (moderate-intensity exercise).  Do strengthening exercises at least twice a week. This is in addition to the moderate-intensity exercise.  Spend less time sitting. Even light physical activity can be beneficial. Watch cholesterol and blood lipids Have your blood tested for lipids and cholesterol at 66 years of age, then have this test every 5 years. Have your cholesterol levels checked more often if:  Your lipid or cholesterol levels are high.  You are older than 66 years of age.  You are at high risk for heart disease. What should I know about cancer screening? Depending on your health history and family history, you may need to have cancer screening at various ages. This may include screening for:  Breast cancer.   Cervical cancer.  Colorectal cancer.  Skin cancer.  Lung cancer. What should I know about heart disease, diabetes, and high blood pressure? Blood pressure and heart disease  High blood pressure causes heart disease and increases the risk of stroke. This is more likely to develop in people who have high blood pressure readings, are of African descent, or are overweight.  Have your blood pressure checked: ? Every 3-5 years if you are 3-20 years of age. ? Every year if you are 72 years old or older. Diabetes Have regular diabetes screenings. This checks your fasting blood sugar level. Have the screening done:  Once every three years after age 109 if you are at a normal weight and have a low risk for diabetes.  More often and at a younger age if you are overweight or have a high risk for diabetes. What should I know about preventing infection? Hepatitis B If you have a higher risk for hepatitis B, you should be screened for this virus. Talk with your health care provider to find out if you are at risk for hepatitis B infection. Hepatitis C Testing is recommended for:  Everyone born from [birth year] through 1963.  Anyone with known risk factors for hepatitis C. Sexually transmitted infections (STIs)  Get screened for STIs, including gonorrhea and chlamydia, if: ? You are sexually active and are younger than 66 years of age. ? You are older than 66 years of age and your health care provider tells you that you are at risk for this type of infection. ? Your sexual activity has changed since you were last screened, and you are at increased risk for chlamydia or gonorrhea. Ask your health care provider if you are at risk.  Ask your health care provider about whether you are at high risk for HIV. Your health care provider may recommend a prescription medicine to help prevent HIV infection. If you choose to take medicine to prevent HIV, you should first get tested for HIV. You should then be tested  every 3 months for as long as you are taking the medicine. Pregnancy  If you are about to stop having your period (premenopausal) and you may become pregnant, seek counseling before you get pregnant.  Take 400 to 800 micrograms (mcg) of folic acid every day if you become pregnant.  Ask  for birth control (contraception) if you want to prevent pregnancy. Osteoporosis and menopause Osteoporosis is a disease in which the bones lose minerals and strength with aging. This can result in bone fractures. If you are 52 years old or older, or if you are at risk for osteoporosis and fractures, ask your health care provider if you should:  Be screened for bone loss.  Take a calcium or vitamin D supplement to lower your risk of fractures.  Be given hormone replacement therapy (HRT) to treat symptoms of menopause. Follow these instructions at home: Lifestyle  Do not use any products that contain nicotine or tobacco, such as cigarettes, e-cigarettes, and chewing tobacco. If you need help quitting, ask your health care provider.  Do not use street drugs.  Do not share needles.  Ask your health care provider for help if you need support or information about quitting drugs. Alcohol use  Do not drink alcohol if: ? Your health care provider tells you not to drink. ? You are pregnant, may be pregnant, or are planning to become pregnant.  If you drink alcohol: ? Limit how much you use to 0-1 drink a day. ? Limit intake if you are breastfeeding.  Be aware of how much alcohol is in your drink. In the [place] one drink equals one 12 oz bottle of beer (355 mL), one 5 oz glass of wine (148 mL), or one 1 oz glass of hard liquor (44 mL). General instructions  Schedule regular health, dental, and eye exams.  Stay current with your vaccines.  Tell your health care provider if: ? You often feel depressed. ? You have ever been abused or do not feel safe at home. Summary  Adopting a healthy lifestyle and  getting preventive care are important in promoting health and wellness.  Follow your health care provider's instructions about healthy diet, exercising, and getting tested or screened for diseases.  Follow your health care provider's instructions on monitoring your cholesterol and blood pressure. This information is not intended to replace advice given to you by your health care provider. Make sure you discuss any questions you have with your health care provider. Document Released: 05/12/2011 Document Revised: 10/20/2018 Document Reviewed: 10/20/2018 Elsevier Patient Education  2020 [employer].         [place] K. Panosh M.D.

## 2019-09-09 ENCOUNTER — Encounter: Payer: Self-pay | Admitting: Internal Medicine

## 2019-09-09 ENCOUNTER — Other Ambulatory Visit: Payer: Self-pay

## 2019-09-09 ENCOUNTER — Ambulatory Visit (INDEPENDENT_AMBULATORY_CARE_PROVIDER_SITE_OTHER): Payer: Medicare Other | Admitting: Internal Medicine

## 2019-09-09 VITALS — BP 126/74 | HR 84 | Temp 97.8°F | Ht 64.0 in | Wt 248.2 lb

## 2019-09-09 DIAGNOSIS — D649 Anemia, unspecified: Secondary | ICD-10-CM

## 2019-09-09 DIAGNOSIS — R739 Hyperglycemia, unspecified: Secondary | ICD-10-CM

## 2019-09-09 DIAGNOSIS — Z79899 Other long term (current) drug therapy: Secondary | ICD-10-CM | POA: Diagnosis not present

## 2019-09-09 DIAGNOSIS — E785 Hyperlipidemia, unspecified: Secondary | ICD-10-CM

## 2019-09-09 DIAGNOSIS — I119 Hypertensive heart disease without heart failure: Secondary | ICD-10-CM

## 2019-09-09 DIAGNOSIS — E2839 Other primary ovarian failure: Secondary | ICD-10-CM

## 2019-09-09 NOTE — Patient Instructions (Addendum)
Continue lifestyle intervention healthy eating and exercise .  To  Avoid getting diabetes .  Cutting out sugars and simple carbs.   The borderline  Hg   May be normal for you  But will recheck in 3-4 months .  Thyroid in range . get your mammogram and colonoscopy    Bone densisty when convenient . Call for appt with  el[facility] radiiology       Preventing Type 2 Diabetes Mellitus Type 2 diabetes (type 2 diabetes mellitus) is a long-term (chronic) disease that affects blood sugar (glucose) levels. Normally, a hormone called insulin allows glucose to enter cells in the body. The cells use glucose for energy. In type 2 diabetes, one or both of these problems may be present:  The body does not make enough insulin.  The body does not respond properly to insulin that it makes (insulin resistance). Insulin resistance or lack of insulin causes excess glucose to build up in the blood instead of going into cells. As a result, high blood glucose (hyperglycemia) develops, which can cause many complications. Being overweight or obese and having an inactive (sedentary) lifestyle can increase your risk for diabetes. Type 2 diabetes can be delayed or prevented by making certain nutrition and lifestyle changes. What nutrition changes can be made?   Eat healthy meals and snacks regularly. Keep a healthy snack with you for when you get hungry between meals, such as fruit or a handful of nuts.  Eat lean meats and proteins that are low in saturated fats, such as chicken, fish, egg whites, and beans. Avoid processed meats.  Eat plenty of fruits and vegetables and plenty of grains that have not been processed (whole grains). It is recommended that you eat: ? 1?2 cups of fruit every day. ? 2?3 cups of vegetables every day. ? 6?8 oz of whole grains every day, such as oats, whole wheat, bulgur, brown rice, quinoa, and millet.  Eat low-fat dairy products, such as milk, yogurt, and cheese.  Eat foods that contain  healthy fats, such as nuts, avocado, olive oil, and canola oil.  Drink water throughout the day. Avoid drinks that contain added sugar, such as soda or sweet tea.  Follow instructions from your health care provider about specific eating or drinking restrictions.  Control how much food you eat at a time (portion size). ? Check food labels to find out the serving sizes of foods. ? Use a kitchen scale to weigh amounts of foods.  Saute or steam food instead of frying it. Cook with water or broth instead of oils or butter.  Limit your intake of: ? Salt (sodium). Have no more than 1 tsp (2,400 mg) of sodium a day. If you have heart disease or high blood pressure, have less than ? tsp (1,500 mg) of sodium a day. ? Saturated fat. This is fat that is solid at room temperature, such as butter or fat on meat. What lifestyle changes can be made? Activity   Do moderate-intensity physical activity for at least 30 minutes on at least 5 days of the week, or as much as told by your health care provider.  Ask your health care provider what activities are safe for you. A mix of physical activities may be best, such as walking, swimming, cycling, and strength training.  Try to add physical activity into your day. For example: ? Park in spots that are farther away than usual, so that you walk more. For example, park in a far corner of  the parking lot when you go to the office or the grocery store. ? Take a walk during your lunch break. ? Use stairs instead of elevators or escalators. Weight Loss  Lose weight as directed. Your health care provider can determine how much weight loss is best for you and can help you lose weight safely.  If you are overweight or obese, you may be instructed to lose at least 5?7 % of your body weight. Alcohol and Tobacco   Limit alcohol intake to no more than 1 drink a day for nonpregnant women and 2 drinks a day for men. One drink equals 12 oz of beer, 5 oz of wine, or  1 oz of hard liquor.  Do not use any tobacco products, such as cigarettes, chewing tobacco, and e-cigarettes. If you need help quitting, ask your health care provider. Work With Your Health Care Provider  Have your blood glucose tested regularly, as told by your health care provider.  Discuss your risk factors and how you can reduce your risk for diabetes.  Get screening tests as told by your health care provider. You may have screening tests regularly, especially if you have certain risk factors for type 2 diabetes.  Make an appointment with a diet and nutrition specialist (registered dietitian). A registered dietitian can help you make a healthy eating plan and can help you understand portion sizes and food labels. Why are these changes important?  It is possible to prevent or delay type 2 diabetes and related health problems by making lifestyle and nutrition changes.  It can be difficult to recognize signs of type 2 diabetes. The best way to avoid possible damage to your body is to take actions to prevent the disease before you develop symptoms. What can happen if changes are not made?  Your blood glucose levels may keep increasing. Having high blood glucose for a long time is dangerous. Too much glucose in your blood can damage your blood vessels, heart, kidneys, nerves, and eyes.  You may develop prediabetes or type 2 diabetes. Type 2 diabetes can lead to many chronic health problems and complications, such as: ? Heart disease. ? Stroke. ? Blindness. ? Kidney disease. ? Depression. ? Poor circulation in the feet and legs, which could lead to surgical removal (amputation) in severe cases. Where to find support  Ask your health care provider to recommend a registered dietitian, diabetes educator, or weight loss program.  Look for local or online weight loss groups.  Join a gym, fitness club, or outdoor activity group, such as a walking club. Where to find more information To  learn more about diabetes and diabetes prevention, visit:  American Diabetes Association (ADA): www.diabetes.[employer] of Diabetes and Digestive and Kidney Diseases: ToyArticles.ca To learn more about healthy eating, visit:  The [place]. Department of Agriculture Architect), Choose My Plate: http://yates.biz/  Office of Disease Prevention and Health Promotion (ODPHP), Dietary Guidelines: ListingMagazine.si Summary  You can reduce your risk for type 2 diabetes by increasing your physical activity, eating healthy foods, and losing weight as directed.  Talk with your health care provider about your risk for type 2 diabetes. Ask about any blood tests or screening tests that you need to have. This information is not intended to replace advice given to you by your health care provider. Make sure you discuss any questions you have with your health care provider. Document Released: 02/18/2016 Document Revised: 02/18/2019 Document Reviewed: 12/18/2015 Elsevier Patient Education  2020 [employer].  Health Maintenance, Female Adopting a healthy lifestyle and getting preventive care are important in promoting health and wellness. Ask your health care provider about:  The right schedule for you to have regular tests and exams.  Things you can do on your own to prevent diseases and keep yourself healthy. What should I know about diet, weight, and exercise? Eat a healthy diet   Eat a diet that includes plenty of vegetables, fruits, low-fat dairy products, and lean protein.  Do not eat a lot of foods that are high in solid fats, added sugars, or sodium. Maintain a healthy weight Body mass index (BMI) is used to identify weight problems. It estimates body fat based on height and weight. Your health care provider can help determine your BMI and help you achieve or maintain a healthy weight. Get regular exercise Get regular  exercise. This is one of the most important things you can do for your health. Most adults should:  Exercise for at least 150 minutes each week. The exercise should increase your heart rate and make you sweat (moderate-intensity exercise).  Do strengthening exercises at least twice a week. This is in addition to the moderate-intensity exercise.  Spend less time sitting. Even light physical activity can be beneficial. Watch cholesterol and blood lipids Have your blood tested for lipids and cholesterol at 66 years of age, then have this test every 5 years. Have your cholesterol levels checked more often if:  Your lipid or cholesterol levels are high.  You are older than 66 years of age.  You are at high risk for heart disease. What should I know about cancer screening? Depending on your health history and family history, you may need to have cancer screening at various ages. This may include screening for:  Breast cancer.  Cervical cancer.  Colorectal cancer.  Skin cancer.  Lung cancer. What should I know about heart disease, diabetes, and high blood pressure? Blood pressure and heart disease  High blood pressure causes heart disease and increases the risk of stroke. This is more likely to develop in people who have high blood pressure readings, are of African descent, or are overweight.  Have your blood pressure checked: ? Every 3-5 years if you are 3618-239 years of age. ? Every year if you are 66 years old or older. Diabetes Have regular diabetes screenings. This checks your fasting blood sugar level. Have the screening done:  Once every three years after age 66 if you are at a normal weight and have a low risk for diabetes.  More often and at a younger age if you are overweight or have a high risk for diabetes. What should I know about preventing infection? Hepatitis B If you have a higher risk for hepatitis B, you should be screened for this virus. Talk with your health  care provider to find out if you are at risk for hepatitis B infection. Hepatitis C Testing is recommended for:  Everyone born from [id number] through 1963.  Anyone with known risk factors for hepatitis C. Sexually transmitted infections (STIs)  Get screened for STIs, including gonorrhea and chlamydia, if: ? You are sexually active and are younger than 66 years of age. ? You are older than 66 years of age and your health care provider tells you that you are at risk for this type of infection. ? Your sexual activity has changed since you were last screened, and you are at increased risk for chlamydia or gonorrhea. Ask your health care provider  if you are at risk.  Ask your health care provider about whether you are at high risk for HIV. Your health care provider may recommend a prescription medicine to help prevent HIV infection. If you choose to take medicine to prevent HIV, you should first get tested for HIV. You should then be tested every 3 months for as long as you are taking the medicine. Pregnancy  If you are about to stop having your period (premenopausal) and you may become pregnant, seek counseling before you get pregnant.  Take 400 to 800 micrograms (mcg) of folic acid every day if you become pregnant.  Ask for birth control (contraception) if you want to prevent pregnancy. Osteoporosis and menopause Osteoporosis is a disease in which the bones lose minerals and strength with aging. This can result in bone fractures. If you are 96 years old or older, or if you are at risk for osteoporosis and fractures, ask your health care provider if you should:  Be screened for bone loss.  Take a calcium or vitamin D supplement to lower your risk of fractures.  Be given hormone replacement therapy (HRT) to treat symptoms of menopause. Follow these instructions at home: Lifestyle  Do not use any products that contain nicotine or tobacco, such as cigarettes, e-cigarettes, and chewing tobacco.  If you need help quitting, ask your health care provider.  Do not use street drugs.  Do not share needles.  Ask your health care provider for help if you need support or information about quitting drugs. Alcohol use  Do not drink alcohol if: ? Your health care provider tells you not to drink. ? You are pregnant, may be pregnant, or are planning to become pregnant.  If you drink alcohol: ? Limit how much you use to 0-1 drink a day. ? Limit intake if you are breastfeeding.  Be aware of how much alcohol is in your drink. In the [place] one drink equals one 12 oz bottle of beer (355 mL), one 5 oz glass of wine (148 mL), or one 1 oz glass of hard liquor (44 mL). General instructions  Schedule regular health, dental, and eye exams.  Stay current with your vaccines.  Tell your health care provider if: ? You often feel depressed. ? You have ever been abused or do not feel safe at home. Summary  Adopting a healthy lifestyle and getting preventive care are important in promoting health and wellness.  Follow your health care provider's instructions about healthy diet, exercising, and getting tested or screened for diseases.  Follow your health care provider's instructions on monitoring your cholesterol and blood pressure. This information is not intended to replace advice given to you by your health care provider. Make sure you discuss any questions you have with your health care provider. Document Released: 05/12/2011 Document Revised: 10/20/2018 Document Reviewed: 10/20/2018 Elsevier Patient Education  2020 [employer].

## 2019-10-19 ENCOUNTER — Other Ambulatory Visit: Payer: Self-pay | Admitting: Internal Medicine

## 2019-11-29 ENCOUNTER — Other Ambulatory Visit: Payer: Self-pay | Admitting: Internal Medicine

## 2020-01-15 ENCOUNTER — Other Ambulatory Visit: Payer: Self-pay | Admitting: Internal Medicine

## 2020-02-10 ENCOUNTER — Other Ambulatory Visit: Payer: Medicare Other

## 2020-02-13 ENCOUNTER — Other Ambulatory Visit: Payer: Medicare Other

## 2020-02-17 ENCOUNTER — Other Ambulatory Visit (INDEPENDENT_AMBULATORY_CARE_PROVIDER_SITE_OTHER): Payer: PPO

## 2020-02-17 ENCOUNTER — Other Ambulatory Visit: Payer: Self-pay

## 2020-02-17 DIAGNOSIS — D649 Anemia, unspecified: Secondary | ICD-10-CM | POA: Diagnosis not present

## 2020-02-17 DIAGNOSIS — R739 Hyperglycemia, unspecified: Secondary | ICD-10-CM

## 2020-02-17 LAB — CBC WITH DIFFERENTIAL/PLATELET
Basophils Absolute: 0 10*3/uL (ref 0.0–0.1)
Basophils Relative: 0.7 % (ref 0.0–3.0)
Eosinophils Absolute: 0.2 10*3/uL (ref 0.0–0.7)
Eosinophils Relative: 3 % (ref 0.0–5.0)
HCT: 36.6 % (ref 36.0–46.0)
Hemoglobin: 12.1 g/dL (ref 12.0–15.0)
Lymphocytes Relative: 35 % (ref 12.0–46.0)
Lymphs Abs: 2.1 10*3/uL (ref 0.7–4.0)
MCHC: 33 g/dL (ref 30.0–36.0)
MCV: 93.8 fl (ref 78.0–100.0)
Monocytes Absolute: 0.4 10*3/uL (ref 0.1–1.0)
Monocytes Relative: 7.2 % (ref 3.0–12.0)
Neutro Abs: 3.3 10*3/uL (ref 1.4–7.7)
Neutrophils Relative %: 54.1 % (ref 43.0–77.0)
Platelets: 242 10*3/uL (ref 150.0–400.0)
RBC: 3.91 Mil/uL (ref 3.87–5.11)
RDW: 13.8 % (ref 11.5–15.5)
WBC: 6.1 10*3/uL (ref 4.0–10.5)

## 2020-02-17 LAB — HEMOGLOBIN A1C: Hgb A1c MFr Bld: 6.1 % (ref 4.6–6.5)

## 2020-02-21 NOTE — Progress Notes (Signed)
A1c is stable  and no diabetes   I( in prediabetic range)   blood count si now normal and no anemia

## 2020-02-23 ENCOUNTER — Other Ambulatory Visit: Payer: Self-pay | Admitting: Internal Medicine

## 2020-04-16 ENCOUNTER — Other Ambulatory Visit: Payer: Self-pay | Admitting: Internal Medicine

## 2020-05-25 ENCOUNTER — Other Ambulatory Visit: Payer: Self-pay | Admitting: Internal Medicine

## 2020-07-13 ENCOUNTER — Other Ambulatory Visit: Payer: Self-pay | Admitting: Internal Medicine

## 2020-08-29 ENCOUNTER — Other Ambulatory Visit: Payer: Self-pay | Admitting: Internal Medicine

## 2020-10-12 ENCOUNTER — Other Ambulatory Visit: Payer: Self-pay | Admitting: Internal Medicine

## 2020-10-12 NOTE — Telephone Encounter (Signed)
Can this patient receive a refill?   Last seen on 09-09-2019,   Labs were 02-17-2020

## 2020-10-12 NOTE — Telephone Encounter (Signed)
She needs yearly  OV   Ok to refill 30 days and refill x 1  refill for both     And arrange  OV

## 2020-11-23 ENCOUNTER — Other Ambulatory Visit: Payer: Self-pay | Admitting: Internal Medicine

## 2020-11-28 ENCOUNTER — Telehealth: Payer: Self-pay | Admitting: Internal Medicine

## 2020-11-28 MED ORDER — LEVOTHYROXINE SODIUM 100 MCG PO TABS: ORAL_TABLET | ORAL | 0 refills | Status: DC

## 2020-11-28 NOTE — Telephone Encounter (Signed)
Pt is calling in stating that she is out of Rx levothyroxine (SYNTHROID) 100 MCG  [facility]:  [facility] [address]  Pt has an appointment on Wednesday 12/05/2020  @ 11:30 a.m.

## 2020-11-28 NOTE — Telephone Encounter (Signed)
Rx done. 

## 2020-12-04 NOTE — Progress Notes (Signed)
Chief Complaint  Patient presents with   Annual Exam    Back and knee pain    HPI: Shelby Wong 70 y.o. comes in today for yearly visit   Overdue for many parameters  "now has insurance"  But seems to be doing ok x back and knee pain that is  Dec mobility  No cv sx  Avoiding covid vaccine cause her past hs of myocarditis and  Concerns for poss se and risk  Since doesn't seem to be decreasing  Infection rate .   Ok to get flu vaccine today  BP taking  Every day  Thyroid     Health Maintenance  Topic Date Due   MAMMOGRAM  Never done   COLONOSCOPY (Pts 45-26yrs Insurance coverage will need to be confirmed)  01/30/2018   DEXA SCAN  Never done   TETANUS/TDAP  04/01/2020   COVID-19 Vaccine (1) 12/21/2020 (Originally 04/30/1956)   PNA vac Low Risk Adult (2 of 2 - PPSV23) 12/05/2021   INFLUENZA VACCINE  Completed   Hepatitis C Screening  Completed   Health Maintenance Review LIFESTYLE:  Exercise:   Tobacco/ETS: no   Alcohol:  Sugar beverages: Sleep:plenty not continuous.  Drug use: no HH:  3   Bro sis in law   Lost  2 1 dog       Hearing: ok  Vision:  No limitations at present . Last eye check UTD  Safety:  Has smoke detector and wears seat belts.   No excess sun exposure. Sees dentist regularly.  Falls: n  Memory: Felt to be good  , no concern from her or her family.  Depression: No anhedonia unusual crying or depressive symptoms  Nutrition: Eats well balanced diet; adequate calcium and vitamin D. No swallowing chewing problems.  Injury: no major injuries in the last six months.  Other healthcare providers:  Reviewed today .  Overdue   Preventive parameters: up-to-date  Reviewed   ADLS:   There are no problems or need for assistance  driving, feeding, obtaining food, dressing, toileting and bathing, managing money using phone. She is independent.    ROS:  GEN/ HEENT: No fever, significant weight changes sweats headaches vision problems hearing  changes, CV/ PULM; No chest pain shortness of breath cough, syncope,edema  change in exercise tolerance. GI /GU: No adominal pain, vomiting, change in bowel habits. No blood in the stool. No significant GU symptoms. SKIN/HEME: ,no acute skin rashes suspicious lesions or bleeding. No lymphadenopathy, nodules, masses.  NEURO/ PSYCH:  No neurologic signs such as weakness numbness. No depression anxiety. IMM/ Allergy: No unusual infections.  Allergy .   REST of 12 system review negative except as per HPI   Past Medical History:  Diagnosis Date   Allergic rhinitis    Anxiety    Depression    Diverticulosis of colon (without mention of hemorrhage)    Elevated LFTs    GERD (gastroesophageal reflux disease)    Hemorrhoids    Hiatal hernia    Hx of echocardiogram    a. Echo 3/14:  Mild LVH, EF 55-65%, normal wall motion, PASP 33   Hypothyroidism    IBS (irritable bowel syndrome)    Myocarditis (HCC)    a. NSTEMI:  12/2011 with peak troponin 2. Cath - mild nonobstructive plaque.;  b. nstemi 01/2012, relook cath - nonobs - MRI sugg of myocarditis   Obesity    POLYP, ANAL AND RECTAL 09/07/2007   Qualifier: Diagnosis of  By: Lawernce Ion, CMA (AAMA),  Carollee Herter S    Pulmonary HTN (HCC)    Enlarged pulmonary artery by CT angio 12/2011 - PAP by echo 12/2011   Shingles 07/09/2012   Transaminitis    Abd [place] 12/2011 - hepatic steatosis    Family History  Problem Relation Age of Onset   Arthritis Mother        osteo   Breast cancer Mother    Coronary artery disease Mother    Other Mother        hearing loss   Diabetes Brother        12 s   Thyroid disease Mother    Colon cancer Neg Hx     Social History   Socioeconomic History   Marital status: Divorced    Spouse name: Not on file   Number of children: 1   Years of education: Not on file   Highest education level: Not on file  Occupational History   Occupation: Chemical engineer Rep  Tobacco Use   Smoking  status: Former Smoker    Quit date: 09/11/2006    Years since quitting: 14.2   Smokeless tobacco: Never Used  Substance and Sexual Activity   Alcohol use: Yes    Comment: occassional   Drug use: No   Sexual activity: Not Currently    Birth control/protection: Post-menopausal  Other Topics Concern   Not on file  Social History Narrative   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  Moved in with family sister    HH of 1   Divorced   Regular exercise- no   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   Esomeprazole Magnesium (NEXIUM PO) Take by mouth.   FLUoxetine (PROZAC) 10 MG capsule TAKE 1 CAPSULE(S) BY MOUTH DAILY . PLEASE SCHEDULE YEARLY VISIT WITH LABS FOR REFILLS   levothyroxine (SYNTHROID) 100 MCG tablet TAKE ONE TABLET BY MOUTH DAILY. (SCHEDULE PHYSICAL WITH LABS FOR FURTHER REFILLS [phone])   lisinopril-hydrochlorothiazide (ZESTORETIC) 10-12.5 MG tablet TAKE 1 TABLET BY MOUTH DAILY . PLEASE SCHEDULE YEARLY VISIT WITH LABS FOR REFILLS   Loratadine 10 MG CAPS Take 10 mg by mouth daily as needed. As needed for sinus   No facility-administered encounter medications on file as of 12/05/2020.    EXAM:  BP 110/80    Pulse 83    Temp 98.1 F (36.7 C) (Oral)    Ht 5\' 4"  (1.626 m)    Wt 243 lb 3.2 oz (110.3 kg)    SpO2 99%    BMI 41.75 kg/m   Body mass index is 41.75 kg/m.  Physical Exam: Vital signs reviewed is a well-developed well-nourished alert  cooperative   who appears stated age in no acute distress.  HEENT: normocephalic atraumatic , Eyes: PERRL EOM's full, conjunctiva clear tenderness., Ears: no deformity EAC's clear TMs with normal landmarks. Mouth: masked NECK: supple without masses, thyromegaly or bruits. CHEST/PULM:  Clear to auscultation and percussion breath sounds equal no wheeze , rales or rhonchi. No chest wall deformities or tenderness. CV: PMI is nondisplaced, S1 S2 no gallops, murmurs, rubs. Peripheral pulses are full without delay.No JVD .  ABDOMEN: Bowel sounds normal nontender  No guard or rebound, no hepato splenomegal no CVA tenderness.   Extremtities:  No clubbing cyanosis or edema, no acute joint swelling or redness no focal atrophy  Fullness left knee posterior  Mild limp no redness or warnth NEURO:  Oriented x3, cranial nerves 3-12 appear to be intact, no obvious focal weakness,gait within normal limits no abnormal reflexes or asymmetrical SKIN: No acute rashes normal turgor, color, no bruising or petechiae. PSYCH: Oriented, good eye contact, no obvious depression anxiety, cognition and judgment appear normal. LN: no cervical axillary il adenopathy No noted deficits in memory, attention, and speech.   Lab Results  Component Value Date   WBC 5.6 12/05/2020   HGB 12.3 12/05/2020   HCT 37.6 12/05/2020   PLT 223.0 12/05/2020   GLUCOSE 99 12/05/2020   CHOL 185 12/05/2020   TRIG 134.0 12/05/2020   HDL 43.40 12/05/2020   LDLCALC 115 (H) 12/05/2020   ALT 9 12/05/2020   AST 16 12/05/2020   NA 141 12/05/2020   K 4.7 12/05/2020   CL 103 12/05/2020   CREATININE 1.24 (H) 12/05/2020   BUN 22 12/05/2020   CO2 30 12/05/2020   TSH 1.44 12/05/2020   INR 1.16 01/16/2012   HGBA1C 6.2 12/05/2020    ASSESSMENT AND PLAN:  Discussed the following assessment and plan:  Visit for preventive health examination  Hypertensive left ventricular hypertrophy, without heart failure - Plan: Basic metabolic panel, CBC with  Differential/Platelet, Hemoglobin A1c, Hepatic function panel, Lipid panel, TSH, TSH, Lipid panel, Hepatic function panel, Hemoglobin A1c, CBC with Differential/Platelet, Basic metabolic panel  Medication management - Plan: Basic metabolic panel, CBC with Differential/Platelet, Hemoglobin A1c, Hepatic function panel, Lipid panel, TSH, TSH, Lipid panel, Hepatic function panel, Hemoglobin A1c, CBC with Differential/Platelet, Basic metabolic panel  Hyperlipidemia, unspecified hyperlipidemia type - Plan: Basic metabolic panel, CBC with Differential/Platelet, Hemoglobin A1c, Hepatic function panel, Lipid panel, TSH, TSH, Lipid panel, Hepatic function panel, Hemoglobin A1c, CBC with Differential/Platelet, Basic metabolic panel  Hyperglycemia - Plan: Basic metabolic panel, CBC with Differential/Platelet, Hemoglobin A1c, Hepatic function panel, Lipid panel, TSH, TSH, Lipid panel, Hepatic function panel, Hemoglobin A1c, CBC with Differential/Platelet, Basic metabolic panel  Hypothyroidism, unspecified type - Plan: Basic metabolic panel, CBC with Differential/Platelet, Hemoglobin A1c, Hepatic function panel, Lipid panel, TSH, TSH, Lipid panel, Hepatic function panel, Hemoglobin A1c, CBC with Differential/Platelet, Basic metabolic panel  Low HDL (under 40) - Plan: Basic metabolic panel, CBC with Differential/Platelet, Hemoglobin A1c, Hepatic function panel, Lipid panel, TSH, TSH, Lipid panel, Hepatic function panel, Hemoglobin A1c, CBC with Differential/Platelet, Basic metabolic panel  Chronic knee pain, unspecified laterality - Plan: Basic metabolic panel, CBC with Differential/Platelet, Hemoglobin A1c, Hepatic function panel, Lipid panel, TSH, TSH, Lipid panel, Hepatic function panel, Hemoglobin A1c, CBC with Differential/Platelet, Basic metabolic panel  Need for immunization against influenza - Plan: Flu Vaccine QUAD High Dose(Fluad)  Need for pneumococcal vaccination - Plan: Pneumococcal conjugate vaccine  13-valent  Counseled about COVID-19 virus infection  Morbid obesity (HCC) Despite hx of myocarditis  Remote still think initial dose pfizer would be beneficial since risk was  In young men on second injection  She has no know prev infection  Same medication and lab monitoring  And fu depending   Work on  weigh tloss as possible  Patient Care Team: Arden Axon, Neta Mends, MD as PCP - General Mardella Layman, MD (Gastroenterology) Tonny Bollman, MD (Cardiology)  Patient Instructions   Get mammogram  Colon cancer screening  Can  Have you see orthopedist about knee and  And back if progressing but probably arthritis.   I think your circulation is ok  And not causing this.   We can do a referral or you make appt   Delbert Harness  [facility]   Can try  voltaren  Topical gel   Over  Pain painful oints over time.   Labs today   prevnar 13 and flu vaccine today  I advise the covid vaccine  Pfizer is lower dose but still protective  For severe disease against te omicron strain.     Health Maintenance, Female Adopting a healthy lifestyle and getting preventive care are important in promoting health and wellness. Ask your health care provider about:  The right schedule for you to have regular tests and exams.  Things you can do on your own to prevent diseases and keep yourself healthy. What should I know about diet, weight, and exercise? Eat a healthy diet  Eat a diet that includes plenty of vegetables, fruits, low-fat dairy products, and lean protein.  Do not eat a lot of foods that are high in solid fats, added sugars, or sodium.   Maintain a healthy weight Body mass index (BMI) is used to identify weight problems. It estimates body fat based on height and weight. Your health care provider can help determine your BMI and help you achieve or maintain a healthy weight. Get regular exercise Get regular exercise. This is one of the most important things you can do for  your health. Most adults should:  Exercise for at least 150 minutes each week. The exercise should increase your heart rate and make you sweat (moderate-intensity exercise).  Do strengthening exercises at least twice a week. This is in addition to the moderate-intensity exercise.  Spend less time sitting. Even light physical activity can be beneficial. Watch cholesterol and blood lipids Have your blood tested for lipids and cholesterol at 68 years of age, then have this test every 5 years. Have your cholesterol levels checked more often if:  Your lipid or cholesterol levels are high.  You are older than 67 years of age.  You are at high risk for heart disease. What should I know about cancer screening? Depending on your health history and family history, you may need to have cancer screening at various ages. This may include screening for:  Breast cancer.  Cervical cancer.  Colorectal cancer.  Skin cancer.  Lung cancer. What should I know about heart disease, diabetes, and high blood pressure? Blood pressure and heart disease  High blood pressure causes heart disease and increases the risk of stroke. This is more likely to develop in people who have high blood pressure readings, are of African descent, or are overweight.  Have your blood pressure checked: ? Every 3-5 years if you are 43-34 years of age. ? Every year if you are 14 years old or older. Diabetes Have regular diabetes screenings. This checks your fasting blood sugar level. Have the screening done:  Once every three years after age 72 if you are at a normal weight and have a low risk for diabetes.  More often and at a younger age if you are overweight or have a high risk for diabetes.  What should I know about preventing infection? Hepatitis B If you have a higher risk for hepatitis B, you should be screened for this virus. Talk with your health care provider to find out if you are at risk for hepatitis B  infection. Hepatitis C Testing is recommended for:  Everyone born from [id number] through 1965.  Anyone with known risk factors for hepatitis C. Sexually transmitted infections (STIs)  Get screened for STIs, including gonorrhea and chlamydia, if: ? You are sexually active and are younger than 68 years of age. ? You are older than 68 years of age and your health care provider tells you that you are at risk for this type of infection. ? Your sexual activity has changed since you were last screened, and you are at increased risk for chlamydia or gonorrhea. Ask your health care provider if you are at risk.  Ask your health care provider about whether you are at high risk for HIV. Your health care provider may recommend a prescription medicine to help prevent HIV infection. If you choose to take medicine to prevent HIV, you should first get tested for HIV. You should then be tested every 3 months for as long as you are taking the medicine. Pregnancy  If you are about to stop having your period (premenopausal) and you may become pregnant, seek counseling before you get pregnant.  Take 400 to 800 micrograms (mcg) of folic acid every day if you become pregnant.  Ask for birth control (contraception) if you want to prevent pregnancy. Osteoporosis and menopause Osteoporosis is a disease in which the bones lose minerals and strength with aging. This can result in bone fractures. If you are 68 years old or older, or if you are at risk for osteoporosis and fractures, ask your health care provider if you should:  Be screened for bone loss.  Take a calcium or vitamin D supplement to lower your risk of fractures.  Be given hormone replacement therapy (HRT) to treat symptoms of menopause. Follow these instructions at home: Lifestyle  Do not use any products that contain nicotine or tobacco, such as cigarettes, e-cigarettes, and chewing tobacco. If you need help quitting, ask your health care  provider.  Do not use street drugs.  Do not share needles.  Ask your health care provider for help if you need support or information about quitting drugs. Alcohol use  Do not drink alcohol if: ? Your health care provider tells you not to drink. ? You are pregnant, may be pregnant, or are planning to become pregnant.  If you drink alcohol: ? Limit how much you use to 0-1 drink a day. ? Limit intake if you are breastfeeding.  Be aware of how much alcohol is in your drink. In the [place] one drink equals one 12 oz bottle of beer (355 mL), one 5 oz glass of wine (148 mL), or one 1 oz glass of hard liquor (44 mL). General instructions  Schedule regular health, dental, and eye exams.  Stay current with your vaccines.  Tell your health care provider if: ? You often feel depressed. ? You have ever been abused or do not feel safe at home. Summary  Adopting a healthy lifestyle and getting preventive care are important in promoting health and wellness.  Follow your health care provider's instructions about healthy diet, exercising, and getting tested or screened for diseases.  Follow your health care provider's instructions on monitoring your cholesterol and blood pressure. This information is not intended to  replace advice given to you by your health care provider. Make sure you discuss any questions you have with your health care provider. Document Revised: 10/20/2018 Document Reviewed: 10/20/2018 Elsevier Patient Education  2021 [employer]          OcontoWanda K. Rue Wong M.D.

## 2020-12-05 ENCOUNTER — Ambulatory Visit (INDEPENDENT_AMBULATORY_CARE_PROVIDER_SITE_OTHER): Payer: PPO | Admitting: Internal Medicine

## 2020-12-05 ENCOUNTER — Encounter: Payer: Self-pay | Admitting: Internal Medicine

## 2020-12-05 ENCOUNTER — Other Ambulatory Visit: Payer: Self-pay

## 2020-12-05 VITALS — BP 110/80 | HR 83 | Temp 98.1°F | Ht 64.0 in | Wt 243.2 lb

## 2020-12-05 DIAGNOSIS — Z23 Encounter for immunization: Secondary | ICD-10-CM | POA: Diagnosis not present

## 2020-12-05 DIAGNOSIS — Z1211 Encounter for screening for malignant neoplasm of colon: Secondary | ICD-10-CM

## 2020-12-05 DIAGNOSIS — M25569 Pain in unspecified knee: Secondary | ICD-10-CM

## 2020-12-05 DIAGNOSIS — I119 Hypertensive heart disease without heart failure: Secondary | ICD-10-CM | POA: Diagnosis not present

## 2020-12-05 DIAGNOSIS — Z7189 Other specified counseling: Secondary | ICD-10-CM | POA: Diagnosis not present

## 2020-12-05 DIAGNOSIS — E786 Lipoprotein deficiency: Secondary | ICD-10-CM | POA: Diagnosis not present

## 2020-12-05 DIAGNOSIS — E785 Hyperlipidemia, unspecified: Secondary | ICD-10-CM | POA: Diagnosis not present

## 2020-12-05 DIAGNOSIS — G8929 Other chronic pain: Secondary | ICD-10-CM

## 2020-12-05 DIAGNOSIS — Z79899 Other long term (current) drug therapy: Secondary | ICD-10-CM

## 2020-12-05 DIAGNOSIS — Z0000 Encounter for general adult medical examination without abnormal findings: Secondary | ICD-10-CM

## 2020-12-05 DIAGNOSIS — R739 Hyperglycemia, unspecified: Secondary | ICD-10-CM

## 2020-12-05 DIAGNOSIS — E039 Hypothyroidism, unspecified: Secondary | ICD-10-CM | POA: Diagnosis not present

## 2020-12-05 LAB — BASIC METABOLIC PANEL
BUN: 22 mg/dL (ref 6–23)
CO2: 30 mEq/L (ref 19–32)
Calcium: 9.4 mg/dL (ref 8.4–10.5)
Chloride: 103 mEq/L (ref 96–112)
Creatinine, Ser: 1.24 mg/dL — ABNORMAL HIGH (ref 0.40–1.20)
GFR: 45 mL/min — ABNORMAL LOW (ref >60.00)
Glucose, Bld: 99 mg/dL (ref 70–99)
Potassium: 4.7 mEq/L (ref 3.5–5.1)
Sodium: 141 mEq/L (ref 135–145)

## 2020-12-05 LAB — CBC WITH DIFFERENTIAL/PLATELET
Basophils Absolute: 0 10*3/uL (ref 0.0–0.1)
Basophils Relative: 0.8 % (ref 0.0–3.0)
Eosinophils Absolute: 0.2 10*3/uL (ref 0.0–0.7)
Eosinophils Relative: 2.8 % (ref 0.0–5.0)
HCT: 37.6 % (ref 36.0–46.0)
Hemoglobin: 12.3 g/dL (ref 12.0–15.0)
Lymphocytes Relative: 25.6 % (ref 12.0–46.0)
Lymphs Abs: 1.4 10*3/uL (ref 0.7–4.0)
MCHC: 32.8 g/dL (ref 30.0–36.0)
MCV: 91.9 fl (ref 78.0–100.0)
Monocytes Absolute: 0.4 10*3/uL (ref 0.1–1.0)
Monocytes Relative: 6.9 % (ref 3.0–12.0)
Neutro Abs: 3.6 10*3/uL (ref 1.4–7.7)
Neutrophils Relative %: 63.9 % (ref 43.0–77.0)
Platelets: 223 10*3/uL (ref 150.0–400.0)
RBC: 4.09 Mil/uL (ref 3.87–5.11)
RDW: 13.4 % (ref 11.5–15.5)
WBC: 5.6 10*3/uL (ref 4.0–10.5)

## 2020-12-05 LAB — LIPID PANEL
Cholesterol: 185 mg/dL (ref 0–200)
HDL: 43.4 mg/dL (ref >39.00)
LDL Cholesterol: 115 mg/dL — ABNORMAL HIGH (ref 0–99)
NonHDL: 141.61
Total CHOL/HDL Ratio: 4
Triglycerides: 134 mg/dL (ref 0.0–149.0)
VLDL: 26.8 mg/dL (ref 0.0–40.0)

## 2020-12-05 LAB — HEPATIC FUNCTION PANEL
ALT: 9 U/L (ref 0–35)
AST: 16 U/L (ref 0–37)
Albumin: 4.2 g/dL (ref 3.5–5.2)
Alkaline Phosphatase: 75 U/L (ref 39–117)
Bilirubin, Direct: 0.2 mg/dL (ref 0.0–0.3)
Total Bilirubin: 0.9 mg/dL (ref 0.2–1.2)
Total Protein: 7.1 g/dL (ref 6.0–8.3)

## 2020-12-05 LAB — HEMOGLOBIN A1C: Hgb A1c MFr Bld: 6.2 % (ref 4.6–6.5)

## 2020-12-05 LAB — TSH: TSH: 1.44 u[IU]/mL (ref 0.35–4.50)

## 2020-12-05 NOTE — Patient Instructions (Addendum)
Get mammogram  Colon cancer screening  Can  Have you see orthopedist about knee and  And back if progressing but probably arthritis.   I think your circulation is ok  And not causing this.   We can do a referral or you make appt   Delbert Harness  [facility]   Can try  voltaren  Topical gel   Over  Pain painful oints over time.   Labs today   prevnar 13 and flu vaccine today  I advise the covid vaccine  Pfizer is lower dose but still protective  For severe disease against te omicron strain.     Health Maintenance, Female Adopting a healthy lifestyle and getting preventive care are important in promoting health and wellness. Ask your health care provider about:  The right schedule for you to have regular tests and exams.  Things you can do on your own to prevent diseases and keep yourself healthy. What should I know about diet, weight, and exercise? Eat a healthy diet  Eat a diet that includes plenty of vegetables, fruits, low-fat dairy products, and lean protein.  Do not eat a lot of foods that are high in solid fats, added sugars, or sodium.   Maintain a healthy weight Body mass index (BMI) is used to identify weight problems. It estimates body fat based on height and weight. Your health care provider can help determine your BMI and help you achieve or maintain a healthy weight. Get regular exercise Get regular exercise. This is one of the most important things you can do for your health. Most adults should:  Exercise for at least 150 minutes each week. The exercise should increase your heart rate and make you sweat (moderate-intensity exercise).  Do strengthening exercises at least twice a week. This is in addition to the moderate-intensity exercise.  Spend less time sitting. Even light physical activity can be beneficial. Watch cholesterol and blood lipids Have your blood tested for lipids and cholesterol at 68 years of age, then have this test every 5  years. Have your cholesterol levels checked more often if:  Your lipid or cholesterol levels are high.  You are older than 68 years of age.  You are at high risk for heart disease. What should I know about cancer screening? Depending on your health history and family history, you may need to have cancer screening at various ages. This may include screening for:  Breast cancer.  Cervical cancer.  Colorectal cancer.  Skin cancer.  Lung cancer. What should I know about heart disease, diabetes, and high blood pressure? Blood pressure and heart disease  High blood pressure causes heart disease and increases the risk of stroke. This is more likely to develop in people who have high blood pressure readings, are of African descent, or are overweight.  Have your blood pressure checked: ? Every 3-5 years if you are 50-41 years of age. ? Every year if you are 42 years old or older. Diabetes Have regular diabetes screenings. This checks your fasting blood sugar level. Have the screening done:  Once every three years after age 60 if you are at a normal weight and have a low risk for diabetes.  More often and at a younger age if you are overweight or have a high risk for diabetes. What should I know about preventing infection? Hepatitis B If you have a higher risk for hepatitis B, you should be screened for this virus. Talk with your health care  provider to find out if you are at risk for hepatitis B infection. Hepatitis C Testing is recommended for:  Everyone born from [birth year] through 1965.  Anyone with known risk factors for hepatitis C. Sexually transmitted infections (STIs)  Get screened for STIs, including gonorrhea and chlamydia, if: ? You are sexually active and are younger than 68 years of age. ? You are older than 69 years of age and your health care provider tells you that you are at risk for this type of infection. ? Your sexual activity has changed since you were last  screened, and you are at increased risk for chlamydia or gonorrhea. Ask your health care provider if you are at risk.  Ask your health care provider about whether you are at high risk for HIV. Your health care provider may recommend a prescription medicine to help prevent HIV infection. If you choose to take medicine to prevent HIV, you should first get tested for HIV. You should then be tested every 3 months for as long as you are taking the medicine. Pregnancy  If you are about to stop having your period (premenopausal) and you may become pregnant, seek counseling before you get pregnant.  Take 400 to 800 micrograms (mcg) of folic acid every day if you become pregnant.  Ask for birth control (contraception) if you want to prevent pregnancy. Osteoporosis and menopause Osteoporosis is a disease in which the bones lose minerals and strength with aging. This can result in bone fractures. If you are 47 years old or older, or if you are at risk for osteoporosis and fractures, ask your health care provider if you should:  Be screened for bone loss.  Take a calcium or vitamin D supplement to lower your risk of fractures.  Be given hormone replacement therapy (HRT) to treat symptoms of menopause. Follow these instructions at home: Lifestyle  Do not use any products that contain nicotine or tobacco, such as cigarettes, e-cigarettes, and chewing tobacco. If you need help quitting, ask your health care provider.  Do not use street drugs.  Do not share needles.  Ask your health care provider for help if you need support or information about quitting drugs. Alcohol use  Do not drink alcohol if: ? Your health care provider tells you not to drink. ? You are pregnant, may be pregnant, or are planning to become pregnant.  If you drink alcohol: ? Limit how much you use to 0-1 drink a day. ? Limit intake if you are breastfeeding.  Be aware of how much alcohol is in your drink. In the [place] one  drink equals one 12 oz bottle of beer (355 mL), one 5 oz glass of wine (148 mL), or one 1 oz glass of hard liquor (44 mL). General instructions  Schedule regular health, dental, and eye exams.  Stay current with your vaccines.  Tell your health care provider if: ? You often feel depressed. ? You have ever been abused or do not feel safe at home. Summary  Adopting a healthy lifestyle and getting preventive care are important in promoting health and wellness.  Follow your health care provider's instructions about healthy diet, exercising, and getting tested or screened for diseases.  Follow your health care provider's instructions on monitoring your cholesterol and blood pressure. This information is not intended to replace advice given to you by your health care provider. Make sure you discuss any questions you have with your health care provider. Document Revised: 10/20/2018 Document Reviewed: 10/20/2018 Elsevier  Patient Education  2021 [employer].

## 2020-12-06 NOTE — Progress Notes (Signed)
Blood sugar in prediabetes range  avoid sugars and simple carbs as discussed  Cholesterol needs to be better be better Thyroid in range  Kidney function slightly decrease as in the past   continue BP control and plan recheck in about  6 months  Arrange  hg a1c and bmp and lipids panel in 6 mos( fasting preferred )   and then OV

## 2020-12-11 ENCOUNTER — Other Ambulatory Visit: Payer: Self-pay

## 2020-12-11 DIAGNOSIS — E119 Type 2 diabetes mellitus without complications: Secondary | ICD-10-CM

## 2020-12-11 DIAGNOSIS — E785 Hyperlipidemia, unspecified: Secondary | ICD-10-CM

## 2020-12-11 DIAGNOSIS — I10 Essential (primary) hypertension: Secondary | ICD-10-CM

## 2020-12-15 ENCOUNTER — Other Ambulatory Visit: Payer: Self-pay | Admitting: Internal Medicine

## 2020-12-17 ENCOUNTER — Other Ambulatory Visit: Payer: Self-pay | Admitting: Internal Medicine

## 2021-01-28 ENCOUNTER — Telehealth (INDEPENDENT_AMBULATORY_CARE_PROVIDER_SITE_OTHER): Payer: PPO | Admitting: Family Medicine

## 2021-01-28 ENCOUNTER — Encounter: Payer: Self-pay | Admitting: Family Medicine

## 2021-01-28 VITALS — Wt 243.0 lb

## 2021-01-28 DIAGNOSIS — M109 Gout, unspecified: Secondary | ICD-10-CM

## 2021-01-28 MED ORDER — METHYLPREDNISOLONE 4 MG PO TBPK: ORAL_TABLET | ORAL | 0 refills | Status: DC

## 2021-01-28 NOTE — Progress Notes (Signed)
Subjective:    Patient ID: Shelby Wong, female    DOB: April 27, 1951, 70 y.o.   MRN: [id number]  HPI Virtual Visit via Video Note  I connected with the patient on 01/28/21 at 10:15 AM EDT by a video enabled telemedicine application and verified that I am speaking with the correct person using two identifiers.  Location patient: home Location provider:work or home office Persons participating in the virtual visit: patient, provider  I discussed the limitations of evaluation and management by telemedicine and the availability of in person appointments. The patient expressed understanding and agreed to proceed.   HPI: Here for 3 days of intense pain, swelling, warmth, and redness in the right great toe. No recent trauma. She has never had this before. Tylenol does not help. She has never had gout before.    ROS: See pertinent positives and negatives per HPI.  Past Medical History:  Diagnosis Date  . Allergic rhinitis   . Anxiety   . Depression   . Diverticulosis of colon (without mention of hemorrhage)   . Elevated LFTs   . GERD (gastroesophageal reflux disease)   . Hemorrhoids   . Hiatal hernia   . Hx of echocardiogram    a. Echo 3/14:  Mild LVH, EF 55-65%, normal wall motion, PASP 33  . Hypothyroidism   . IBS (irritable bowel syndrome)   . Myocarditis (HCC)    a. NSTEMI:  12/2011 with peak troponin 2. Cath - mild nonobstructive plaque.;  b. nstemi 01/2012, relook cath - nonobs - MRI sugg of myocarditis  . Obesity   . POLYP, ANAL AND RECTAL 09/07/2007   Qualifier: Diagnosis of  By: Lawernce Ion, CMA (AAMA), Bethann Berkshire   . Pulmonary HTN (HCC)    Enlarged pulmonary artery by CT angio 12/2011 - PAP by echo 12/2011  . Shingles 07/09/2012  . Transaminitis    Abd [place] 12/2011 - hepatic steatosis    Past Surgical History:  Procedure Laterality Date  . CHOLECYSTECTOMY    . LEFT AND RIGHT HEART CATHETERIZATION WITH CORONARY ANGIOGRAM N/A 01/16/2012   Procedure: LEFT AND RIGHT HEART  CATHETERIZATION WITH CORONARY ANGIOGRAM;  Surgeon: Wendall Stade, MD;  Location: [facility] CATH LAB;  Service: Cardiovascular;  Laterality: N/A;  . LEFT HEART CATHETERIZATION WITH CORONARY ANGIOGRAM N/A 12/15/2011   Procedure: LEFT HEART CATHETERIZATION WITH CORONARY ANGIOGRAM;  Surgeon: Peter M Swaziland, MD;  Location: [facility] CATH LAB;  Service: Cardiovascular;  Laterality: N/A;  . TONSILLECTOMY AND ADENOIDECTOMY      Family History  Problem Relation Age of Onset  . Arthritis Mother        osteo  . Breast cancer Mother   . Coronary artery disease Mother   . Other Mother        hearing loss  . Diabetes Brother        50 s  . Thyroid disease Mother   . Colon cancer Neg Hx      Current Outpatient Medications:  .  acetaminophen (TYLENOL) 650 MG CR tablet, Take 1,300 mg by mouth every 12 (twelve) hours., Disp: , Rfl:  .  Esomeprazole Magnesium (NEXIUM PO), Take by mouth., Disp: , Rfl:  .  FLUoxetine (PROZAC) 10 MG capsule, TAKE ONE CAPSULE BY MOUTH DAILY, Disp: 90 capsule, Rfl: 1 .  levothyroxine (SYNTHROID) 100 MCG tablet, TAKE ONE TABLET BY MOUTH DAILY, Disp: 90 tablet, Rfl: 0 .  lisinopril-hydrochlorothiazide (ZESTORETIC) 10-12.5 MG tablet, Take 1 tablet by mouth daily., Disp: 90 tablet, Rfl: 1 .  Loratadine 10 MG CAPS, Take 10 mg by mouth daily as needed. As needed for sinus, Disp: , Rfl:  .  methylPREDNISolone (MEDROL DOSEPAK) 4 MG TBPK tablet, As directed, Disp: 21 tablet, Rfl: 0  EXAM:  VITALS per patient if applicable:  GENERAL: alert, oriented, appears well and in no acute distress  HEENT: atraumatic, conjunttiva clear, no obvious abnormalities on inspection of external nose and ears  NECK: normal movements of the head and neck  LUNGS: on inspection no signs of respiratory distress, breathing rate appears normal, no obvious gross SOB, gasping or wheezing  CV: no obvious cyanosis  MS: moves all visible extremities without noticeable abnormality  PSYCH/NEURO: pleasant and  cooperative, no obvious depression or anxiety, speech and thought processing grossly intact  ASSESSMENT AND PLAN: This is likely gout. Treat with a Medrol dose pack. Recheck as needed.  Gershon Crane, MD  Discussed the following assessment and plan:  No diagnosis found.     I discussed the assessment and treatment plan with the patient. The patient was provided an opportunity to ask questions and all were answered. The patient agreed with the plan and demonstrated an understanding of the instructions.   The patient was advised to call back or seek an in-person evaluation if the symptoms worsen or if the condition fails to improve as anticipated.     Review of Systems     Objective:   Physical Exam        Assessment & Plan:

## 2021-02-13 ENCOUNTER — Telehealth: Payer: Self-pay | Admitting: Internal Medicine

## 2021-02-13 NOTE — Telephone Encounter (Signed)
Patient is calling and stated that she was seen a couple of weeks ago for gout in her big toe that hasn't gotten any better with medication. Patient is asking if another prescription be sent to the pharmacy, please advise. CB is [phone]

## 2021-02-14 ENCOUNTER — Telehealth (INDEPENDENT_AMBULATORY_CARE_PROVIDER_SITE_OTHER): Payer: PPO | Admitting: Internal Medicine

## 2021-02-14 ENCOUNTER — Encounter: Payer: Self-pay | Admitting: Internal Medicine

## 2021-02-14 DIAGNOSIS — Z79899 Other long term (current) drug therapy: Secondary | ICD-10-CM | POA: Diagnosis not present

## 2021-02-14 DIAGNOSIS — M109 Gout, unspecified: Secondary | ICD-10-CM

## 2021-02-14 MED ORDER — PREDNISONE 20 MG PO TABS: ORAL_TABLET | ORAL | 0 refills | Status: DC

## 2021-02-14 NOTE — Progress Notes (Signed)
Virtual Visit via Video Note  I connected with@ on 02/14/21 at 10:30 AM EDT by a video enabled telemedicine application and verified that I am speaking with the correct person using two identifiers. Location patient: home Location provider: home office Persons participating in the virtual visit: patient, provider Because of technical lapses in care agility had a visual and audio on 2 different devices. WIth national recommendations  regarding COVID 19 pandemic   video visit is advised over in office visit for this patient.  Patient aware  of the limitations of evaluation and management by telemedicine and  availability of in person appointments. and agreed to proceed.   HPI: Shelby Wong presents for video visit see previous notes had onset of severe right great toe foot swelling in March there were 2 to 3 days that was excruciating saw Dr. Clent Ridges on a video visit and the clinical diagnosis of gout was made was given a Medrol Dosepak started feeling better within 24 hours but at the end of the medicine still had a little bit of twinge left.  At least 90% better then began to ramp up again over the last 48 hours and is calling for advice help. No injury fever other.  Still fits the symptom complex she had last time and feels it is consistent with diagnosis of gout when she checked on the [facility] website.   ROS: See pertinent positives and negatives per HPI.  Past Medical History:  Diagnosis Date  . Allergic rhinitis   . Anxiety   . Depression   . Diverticulosis of colon (without mention of hemorrhage)   . Elevated LFTs   . GERD (gastroesophageal reflux disease)   . Hemorrhoids   . Hiatal hernia   . Hx of echocardiogram    a. Echo 3/14:  Mild LVH, EF 55-65%, normal wall motion, PASP 33  . Hypothyroidism   . IBS (irritable bowel syndrome)   . Myocarditis (HCC)    a. NSTEMI:  12/2011 with peak troponin 2. Cath - mild nonobstructive plaque.;  b. nstemi 01/2012, relook cath - nonobs - MRI sugg of  myocarditis  . Obesity   . POLYP, ANAL AND RECTAL 09/07/2007   Qualifier: Diagnosis of  By: Lawernce Ion, CMA (AAMA), Bethann Berkshire   . Pulmonary HTN (HCC)    Enlarged pulmonary artery by CT angio 12/2011 - PAP by echo 12/2011  . Shingles 07/09/2012  . Transaminitis    Abd [place] 12/2011 - hepatic steatosis    Past Surgical History:  Procedure Laterality Date  . CHOLECYSTECTOMY    . LEFT AND RIGHT HEART CATHETERIZATION WITH CORONARY ANGIOGRAM N/A 01/16/2012   Procedure: LEFT AND RIGHT HEART CATHETERIZATION WITH CORONARY ANGIOGRAM;  Surgeon: Wendall Stade, MD;  Location: [facility] CATH LAB;  Service: Cardiovascular;  Laterality: N/A;  . LEFT HEART CATHETERIZATION WITH CORONARY ANGIOGRAM N/A 12/15/2011   Procedure: LEFT HEART CATHETERIZATION WITH CORONARY ANGIOGRAM;  Surgeon: Peter M Swaziland, MD;  Location: [facility] CATH LAB;  Service: Cardiovascular;  Laterality: N/A;  . TONSILLECTOMY AND ADENOIDECTOMY      Family History  Problem Relation Age of Onset  . Arthritis Mother        osteo  . Breast cancer Mother   . Coronary artery disease Mother   . Other Mother        hearing loss  . Diabetes Brother        50 s  . Thyroid disease Mother   . Colon cancer Neg Hx     Social  History   Tobacco Use  . Smoking status: Former Smoker    Quit date: 09/11/2006    Years since quitting: 14.4  . Smokeless tobacco: Never Used  Substance Use Topics  . Alcohol use: Yes    Comment: occassional  . Drug use: No      Current Outpatient Medications:  .  acetaminophen (TYLENOL) 650 MG CR tablet, Take 1,300 mg by mouth every 12 (twelve) hours., Disp: , Rfl:  .  Esomeprazole Magnesium (NEXIUM PO), Take by mouth., Disp: , Rfl:  .  FLUoxetine (PROZAC) 10 MG capsule, TAKE ONE CAPSULE BY MOUTH DAILY, Disp: 90 capsule, Rfl: 1 .  levothyroxine (SYNTHROID) 100 MCG tablet, TAKE ONE TABLET BY MOUTH DAILY, Disp: 90 tablet, Rfl: 0 .  lisinopril-hydrochlorothiazide (ZESTORETIC) 10-12.5 MG tablet, Take 1 tablet by mouth daily.,  Disp: 90 tablet, Rfl: 1 .  Loratadine 10 MG CAPS, Take 10 mg by mouth daily as needed. As needed for sinus, Disp: , Rfl:  .  methylPREDNISolone (MEDROL DOSEPAK) 4 MG TBPK tablet, As directed, Disp: 21 tablet, Rfl: 0 .  predniSONE (DELTASONE) 20 MG tablet, Take 3,3,3,2,2,2,1,1,1, 1/.2 1./2 1/.2 pills per day ( 12 days taper) for gout relapse, Disp: 24 tablet, Rfl: 0  EXAM: BP Readings from Last 3 Encounters:  12/05/20 110/80  09/09/19 126/74  09/08/18 116/78    VITALS per patient if applicable:  GENERAL: alert, oriented, appears well and in no acute distress  HEENT: atraumatic, conjunttiva clear, no obvious abnormalities on inspection of external nose and ears  NECK: normal movements of the head and neck  LUNGS: on inspection no signs of respiratory distress, breathing rate appears normal, no obvious gross SOB, gasping or wheezing  CV: no obvious cyanosis  MS: moves all visible extremities right lower extremity foot shows +2 swelling pink at the MTP and some on the second toe but movement appears normal and normal capillary refill no other distortions obvious.  PSYCH/NEURO: pleasant and cooperative, no obvious depression or anxiety, speech and thought processing grossly intact Lab Results  Component Value Date   WBC 5.6 12/05/2020   HGB 12.3 12/05/2020   HCT 37.6 12/05/2020   PLT 223.0 12/05/2020   GLUCOSE 99 12/05/2020   CHOL 185 12/05/2020   TRIG 134.0 12/05/2020   HDL 43.40 12/05/2020   LDLCALC 115 (H) 12/05/2020   ALT 9 12/05/2020   AST 16 12/05/2020   NA 141 12/05/2020   K 4.7 12/05/2020   CL 103 12/05/2020   CREATININE 1.24 (H) 12/05/2020   BUN 22 12/05/2020   CO2 30 12/05/2020   TSH 1.44 12/05/2020   INR 1.16 01/16/2012   HGBA1C 6.2 12/05/2020    ASSESSMENT AND PLAN:  Discussed the following assessment and plan:    ICD-10-CM   1. Acute gout involving toe of right foot, unspecified cause  M10.9   2. Medication management  Z79.899     Counseled.  Most  likely relapsing gout based on history and context and visual. No past history will need to follow-up 2 weeks or so after treatment can check uric acid at that time. Cannot take NSAIDs we will plan a longer taper of steroid 12 days. Did caution that steroids can raise blood sugar in the short run.  Expectant management and discussion of plan and treatment with opportunity to ask questions and all were answered. The patient agreed with the plan and demonstrated an understanding of the instructions.   Advised to call back or seek an in-person evaluation if  worsening  or having  further concerns . No follow-ups on file. 32 minutes counseling reviewing the records from Dr. Claris Che visit planning ordering future and review of previous lab work.   Berniece Andreas, MD

## 2021-02-14 NOTE — Telephone Encounter (Signed)
We had visit today and she will make visit  In about 2 weeks at fu

## 2021-02-14 NOTE — Telephone Encounter (Signed)
Spoke with the patient and she declined to have an office visit for next week. Patient stated that she would like to have a virtual visit today with PCP so that her gout issue could be addressed. Patient stated that she was told to follow up with Dr. Clent Ridges if the problem persisted.

## 2021-02-14 NOTE — Telephone Encounter (Signed)
Ask Dr Clent Ridges  Who saw her   About next step  Otherwise needs a visit   ( prefer in person since last was video   And will need in person with me   Arrange for next week ?)

## 2021-02-26 NOTE — Progress Notes (Signed)
Chief Complaint  Patient presents with  . Follow-up    HPI: Shelby Wong 70 y.o. come in for  Fu relapsing  probable first gout attack.  Seen 4 7  Recurrent symptoms right great toe and foot improved  Right away and took at least 2 to 3 days to have the swelling and pain go down.  She finished the prednisone 2 days ago and it has not recurred .   No injury can walk to now. No history of gout unknown in family. Has check some things out on the [facility] site.  ROS: See pertinent positives and negatives per HPI.  Past Medical History:  Diagnosis Date  . Allergic rhinitis   . Anxiety   . Depression   . Diverticulosis of colon (without mention of hemorrhage)   . Elevated LFTs   . GERD (gastroesophageal reflux disease)   . Hemorrhoids   . Hiatal hernia   . Hx of echocardiogram    a. Echo 3/14:  Mild LVH, EF 55-65%, normal wall motion, PASP 33  . Hypothyroidism   . IBS (irritable bowel syndrome)   . Myocarditis (HCC)    a. NSTEMI:  12/2011 with peak troponin 2. Cath - mild nonobstructive plaque.;  b. nstemi 01/2012, relook cath - nonobs - MRI sugg of myocarditis  . Obesity   . POLYP, ANAL AND RECTAL 09/07/2007   Qualifier: Diagnosis of  By: Lawernce Ion, CMA (AAMA), Bethann Berkshire   . Pulmonary HTN (HCC)    Enlarged pulmonary artery by CT angio 12/2011 - PAP by echo 12/2011  . Shingles 07/09/2012  . Transaminitis    Abd [place] 12/2011 - hepatic steatosis    Family History  Problem Relation Age of Onset  . Arthritis Mother        osteo  . Breast cancer Mother   . Coronary artery disease Mother   . Other Mother        hearing loss  . Diabetes Brother        50 s  . Thyroid disease Mother   . Colon cancer Neg Hx     Social History   Socioeconomic History  . Marital status: Divorced    Spouse name: Not on file  . Number of children: 1  . Years of education: Not on file  . Highest education level: Not on file  Occupational History  . Occupation: Chemical engineer Rep  Tobacco Use   . Smoking status: Former Smoker    Quit date: 09/11/2006    Years since quitting: 14.4  . Smokeless tobacco: Never Used  Substance and Sexual Activity  . Alcohol use: Yes    Comment: occassional  . Drug use: No  . Sexual activity: Not Currently    Birth control/protection: Post-menopausal  Other Topics Concern  . 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 acetaminophen (TYLENOL) 650 MG CR tablet Take 1,300 mg by mouth every 8 (eight) hours.    . Esomeprazole Magnesium (NEXIUM PO) Take by mouth.    Marland Kitchen. FLUoxetine (PROZAC) 10 MG capsule TAKE ONE CAPSULE BY MOUTH DAILY 90 capsule 1  . levothyroxine (SYNTHROID) 100 MCG tablet TAKE ONE TABLET BY MOUTH DAILY 90 tablet 0  . lisinopril-hydrochlorothiazide (ZESTORETIC) 10-12.5 MG tablet Take 1 tablet by mouth daily. 90 tablet 1  . Loratadine 10 MG CAPS Take 10 mg by mouth daily as needed. As needed for sinus    . predniSONE (DELTASONE) 20 MG tablet Take 3,3,3,2,2,2,1,1,1, 1/.2 1./2 1/.2 pills per day ( 12 days taper) for gout relapse 24 tablet 0  . methylPREDNISolone (MEDROL DOSEPAK) 4 MG TBPK tablet As directed 21 tablet 0   No facility-administered medications prior to visit.     EXAM:  BP 100/60 (BP Location: Right Arm, Patient Position: Sitting, Cuff Size: Large)   Pulse 88   Temp 98.1 F (36.7 C) (Oral)   Ht 5\' 4"  (1.626 m)   Wt 239 lb 6.4 oz (108.6 kg)    SpO2 95%   BMI 41.09 kg/m   Body mass index is 41.09 kg/m.  GENERAL: vitals reviewed and listed above, alert, oriented, appears well hydrated and in no acute distress HEENT: atraumatic, conjunctiva  clear, no obvious abnormalities on inspection of external nose and ears OP : Masked CV: HRRR, no clubbing cyanosis or  peripheral edema nl cap refill  MS: moves all extremities right foot no acute swelling prominent MTP without any redness some puffiness over the second meta tarsal area but no bony tenderness.  Pulses are intact bilaterally. PSYCH: pleasant and cooperative, no obvious depression or anxiety could give today. Lab Results  Component Value Date   WBC 5.6 12/05/2020   HGB 12.3 12/05/2020   HCT 37.6 12/05/2020   PLT 223.0 12/05/2020   GLUCOSE 99 12/05/2020   CHOL 185 12/05/2020   TRIG 134.0 12/05/2020   HDL 43.40 12/05/2020   LDLCALC 115 (H) 12/05/2020   ALT 9 12/05/2020   AST 16 12/05/2020   NA 141 12/05/2020   K 4.7 12/05/2020   CL 103 12/05/2020   CREATININE 1.24 (H) 12/05/2020   BUN 22 12/05/2020   CO2 30 12/05/2020   TSH 1.44 12/05/2020   INR 1.16 01/16/2012   HGBA1C 6.2 12/05/2020   BP Readings from Last 3 Encounters:  02/27/21 100/60  12/05/20 110/80  09/09/19 126/74   Wt Readings from Last 3 Encounters:  02/27/21 239 lb 6.4 oz (108.6 kg)  01/28/21 243 lb (110.2 kg)  12/05/20 243 lb 3.2 oz (110.3 kg)    No results found for: LABURIC  ASSESSMENT AND PLAN:  Discussed the following assessment and plan:  Foot pain, right - Plan: DG Foot Complete Right, Uric acid, Basic metabolic panel, Basic metabolic panel, Uric acid  Gout, unspecified cause, unspecified chronicity, unspecified site - Plan: DG Foot Complete Right, Uric acid, Basic metabolic panel, Basic metabolic panel, Uric acid  Medication management Most likely gout by context and response to anti-inflammatories, getting baseline x-ray because residual puffiness do not expect unusual  abnormalities. Uric acid level today as well as BMP Discussed dietary hydration etc.  The only medication that would be triggered is her HCTZ at low-dose.  If recurrent symptoms and problems may need to be on prevention medication. Encouraged and continued her healthy weight loss and will follow  her blood sugar. -Patient advised to return or notify health care team  if  new concerns arise.  Patient Instructions   Get x ray and lab today   If recurrent  we may consider medication to lower  Uric acid .      Gout  Gout is a condition that causes painful swelling of the joints. Gout is a type of inflammation of the joints (arthritis). This condition is caused by having too much uric acid in the body. Uric acid is a chemical that forms when the body breaks down substances called purines. Purines are important for building body proteins. When the body has too much uric acid, sharp crystals can form and build up inside the joints. This causes pain and swelling. Gout attacks can happen quickly and may be very painful (acute gout). Over time, the attacks can affect more joints and become more frequent (chronic gout). Gout can also cause uric acid to build up under the skin and inside the kidneys. What are the causes? This condition is caused by too much uric acid in your blood. This can happen because:  Your kidneys do not remove enough uric acid from your blood. This is the most common cause.  Your body makes too much uric acid. This can happen with some cancers and cancer treatments. It can also occur if your body is breaking down too many red blood cells (hemolytic anemia).  You eat too many foods that are high in purines. These foods include organ meats and some seafood. Alcohol, especially beer, is also high in purines. A gout attack may be triggered by trauma or stress. What increases the risk? You are more likely to develop this condition if you:  Have a family history of gout.  Are  female and middle-aged.  Are female and have gone through menopause.  Are obese.  Frequently drink alcohol, especially beer.  Are dehydrated.  Lose weight too quickly.  Have an organ transplant.  Have lead poisoning.  Take certain medicines, including aspirin, cyclosporine, diuretics, levodopa, and niacin.  Have kidney disease.  Have a skin condition called psoriasis. What are the signs or symptoms? An attack of acute gout happens quickly. It usually occurs in just one joint. The most common place is the big toe. Attacks often start at night. Other joints that may be affected include joints of the feet, ankle, knee, fingers, wrist, or elbow. Symptoms of this condition may include:  Severe pain.  Warmth.  Swelling.  Stiffness.  Tenderness. The affected joint may be very painful to touch.  Shiny, red, or purple skin.  Chills and fever. Chronic gout may cause symptoms more frequently. More joints may be involved. You may also have white or yellow lumps (tophi) on your hands or feet or in other areas near your joints.   How is this diagnosed? This condition is diagnosed based on your symptoms, medical history, and physical exam. You may have tests, such as:  Blood tests to measure uric acid levels.  Removal of joint fluid with a thin needle (aspiration) to look for uric acid crystals.  X-rays to look for joint damage. How is this treated? Treatment for this condition has two phases: treating an acute attack and preventing future attacks. Acute gout treatment may include medicines to reduce pain and swelling, including:  NSAIDs.  Steroids. These are strong anti-inflammatory medicines that can be taken by mouth (orally) or injected into a joint.  Colchicine. This medicine relieves pain and swelling when  it is taken soon after an attack. It can be given by mouth or through an IV. Preventive treatment may include:  Daily use of smaller doses of NSAIDs or  colchicine.  Use of a medicine that reduces uric acid levels in your blood.  Changes to your diet. You may need to see a dietitian about what to eat and drink to prevent gout. Follow these instructions at home: During a gout attack  If directed, put ice on the affected area: ? Put ice in a plastic bag. ? Place a towel between your skin and the bag. ? Leave the ice on for 20 minutes, 2-3 times a day.  Raise (elevate) the affected joint above the level of your heart as often as possible.  Rest the joint as much as possible. If the affected joint is in your leg, you may be given crutches to use.  Follow instructions from your health care provider about eating or drinking restrictions.   Avoiding future gout attacks  Follow a low-purine diet as told by your dietitian or health care provider. Avoid foods and drinks that are high in purines, including liver, kidney, anchovies, asparagus, herring, mushrooms, mussels, and beer.  Maintain a healthy weight or lose weight if you are overweight. If you want to lose weight, talk with your health care provider. It is important that you do not lose weight too quickly.  Start or maintain an exercise program as told by your health care provider. Eating and drinking  Drink enough fluids to keep your urine pale yellow.  If you drink alcohol: ? Limit how much you use to:  0-1 drink a day for women.  0-2 drinks a day for men. ? Be aware of how much alcohol is in your drink. In the [place] one drink equals one 12 oz bottle of beer (355 mL) one 5 oz glass of wine (148 mL), or one 1 oz glass of hard liquor (44 mL). General instructions  Take over-the-counter and prescription medicines only as told by your health care provider.  Do not drive or use heavy machinery while taking prescription pain medicine.  Return to your normal activities as told by your health care provider. Ask your health care provider what activities are safe for you.  Keep all  follow-up visits as told by your health care provider. This is important. Contact a health care provider if you have:  Another gout attack.  Continuing symptoms of a gout attack after 10 days of treatment.  Side effects from your medicines.  Chills or a fever.  Burning pain when you urinate.  Pain in your lower back or belly. Get help right away if you:  Have severe or uncontrolled pain.  Cannot urinate. Summary  Gout is painful swelling of the joints caused by inflammation.  The most common site of pain is the big toe, but it can affect other joints in the body.  Medicines and dietary changes can help to prevent and treat gout attacks. This information is not intended to replace advice given to you by your health care provider. Make sure you discuss any questions you have with your health care provider. Document Revised: 05/19/2018 Document Reviewed: 05/19/2018 Elsevier Patient Education  2021 [employer].     [place]. Petrita Blunck M.D.

## 2021-02-27 ENCOUNTER — Ambulatory Visit (INDEPENDENT_AMBULATORY_CARE_PROVIDER_SITE_OTHER): Payer: PPO | Admitting: Internal Medicine

## 2021-02-27 ENCOUNTER — Encounter: Payer: Self-pay | Admitting: Internal Medicine

## 2021-02-27 ENCOUNTER — Other Ambulatory Visit: Payer: Self-pay

## 2021-02-27 ENCOUNTER — Ambulatory Visit (INDEPENDENT_AMBULATORY_CARE_PROVIDER_SITE_OTHER): Payer: PPO

## 2021-02-27 VITALS — BP 100/60 | HR 88 | Temp 98.1°F | Ht 64.0 in | Wt 239.4 lb

## 2021-02-27 DIAGNOSIS — M109 Gout, unspecified: Secondary | ICD-10-CM

## 2021-02-27 DIAGNOSIS — Z79899 Other long term (current) drug therapy: Secondary | ICD-10-CM | POA: Diagnosis not present

## 2021-02-27 DIAGNOSIS — M79671 Pain in right foot: Secondary | ICD-10-CM

## 2021-02-27 DIAGNOSIS — M7731 Calcaneal spur, right foot: Secondary | ICD-10-CM | POA: Diagnosis not present

## 2021-02-27 DIAGNOSIS — M19071 Primary osteoarthritis, right ankle and foot: Secondary | ICD-10-CM | POA: Diagnosis not present

## 2021-02-27 LAB — BASIC METABOLIC PANEL WITH GFR
BUN: 26 mg/dL — ABNORMAL HIGH (ref 6–23)
CO2: 29 meq/L (ref 19–32)
Calcium: 9.1 mg/dL (ref 8.4–10.5)
Chloride: 100 meq/L (ref 96–112)
Creatinine, Ser: 1.39 mg/dL — ABNORMAL HIGH (ref 0.40–1.20)
GFR: 39.18 mL/min — ABNORMAL LOW
Glucose, Bld: 82 mg/dL (ref 70–99)
Potassium: 3.9 meq/L (ref 3.5–5.1)
Sodium: 139 meq/L (ref 135–145)

## 2021-02-27 LAB — URIC ACID: Uric Acid, Serum: 7.4 mg/dL — ABNORMAL HIGH (ref 2.4–7.0)

## 2021-02-27 NOTE — Patient Instructions (Signed)
Get x ray and lab today   If recurrent  we may consider medication to lower  Uric acid .      Gout  Gout is a condition that causes painful swelling of the joints. Gout is a type of inflammation of the joints (arthritis). This condition is caused by having too much uric acid in the body. Uric acid is a chemical that forms when the body breaks down substances called purines. Purines are important for building body proteins. When the body has too much uric acid, sharp crystals can form and build up inside the joints. This causes pain and swelling. Gout attacks can happen quickly and may be very painful (acute gout). Over time, the attacks can affect more joints and become more frequent (chronic gout). Gout can also cause uric acid to build up under the skin and inside the kidneys. What are the causes? This condition is caused by too much uric acid in your blood. This can happen because:  Your kidneys do not remove enough uric acid from your blood. This is the most common cause.  Your body makes too much uric acid. This can happen with some cancers and cancer treatments. It can also occur if your body is breaking down too many red blood cells (hemolytic anemia).  You eat too many foods that are high in purines. These foods include organ meats and some seafood. Alcohol, especially beer, is also high in purines. A gout attack may be triggered by trauma or stress. What increases the risk? You are more likely to develop this condition if you:  Have a family history of gout.  Are female and middle-aged.  Are female and have gone through menopause.  Are obese.  Frequently drink alcohol, especially beer.  Are dehydrated.  Lose weight too quickly.  Have an organ transplant.  Have lead poisoning.  Take certain medicines, including aspirin, cyclosporine, diuretics, levodopa, and niacin.  Have kidney disease.  Have a skin condition called psoriasis. What are the signs or symptoms? An  attack of acute gout happens quickly. It usually occurs in just one joint. The most common place is the big toe. Attacks often start at night. Other joints that may be affected include joints of the feet, ankle, knee, fingers, wrist, or elbow. Symptoms of this condition may include:  Severe pain.  Warmth.  Swelling.  Stiffness.  Tenderness. The affected joint may be very painful to touch.  Shiny, red, or purple skin.  Chills and fever. Chronic gout may cause symptoms more frequently. More joints may be involved. You may also have white or yellow lumps (tophi) on your hands or feet or in other areas near your joints.   How is this diagnosed? This condition is diagnosed based on your symptoms, medical history, and physical exam. You may have tests, such as:  Blood tests to measure uric acid levels.  Removal of joint fluid with a thin needle (aspiration) to look for uric acid crystals.  X-rays to look for joint damage. How is this treated? Treatment for this condition has two phases: treating an acute attack and preventing future attacks. Acute gout treatment may include medicines to reduce pain and swelling, including:  NSAIDs.  Steroids. These are strong anti-inflammatory medicines that can be taken by mouth (orally) or injected into a joint.  Colchicine. This medicine relieves pain and swelling when it is taken soon after an attack. It can be given by mouth or through an IV. Preventive treatment may include:  Daily  use of smaller doses of NSAIDs or colchicine.  Use of a medicine that reduces uric acid levels in your blood.  Changes to your diet. You may need to see a dietitian about what to eat and drink to prevent gout. Follow these instructions at home: During a gout attack  If directed, put ice on the affected area: ? Put ice in a plastic bag. ? Place a towel between your skin and the bag. ? Leave the ice on for 20 minutes, 2-3 times a day.  Raise (elevate) the  affected joint above the level of your heart as often as possible.  Rest the joint as much as possible. If the affected joint is in your leg, you may be given crutches to use.  Follow instructions from your health care provider about eating or drinking restrictions.   Avoiding future gout attacks  Follow a low-purine diet as told by your dietitian or health care provider. Avoid foods and drinks that are high in purines, including liver, kidney, anchovies, asparagus, herring, mushrooms, mussels, and beer.  Maintain a healthy weight or lose weight if you are overweight. If you want to lose weight, talk with your health care provider. It is important that you do not lose weight too quickly.  Start or maintain an exercise program as told by your health care provider. Eating and drinking  Drink enough fluids to keep your urine pale yellow.  If you drink alcohol: ? Limit how much you use to:  0-1 drink a day for women.  0-2 drinks a day for men. ? Be aware of how much alcohol is in your drink. In the [place] one drink equals one 12 oz bottle of beer (355 mL) one 5 oz glass of wine (148 mL), or one 1 oz glass of hard liquor (44 mL). General instructions  Take over-the-counter and prescription medicines only as told by your health care provider.  Do not drive or use heavy machinery while taking prescription pain medicine.  Return to your normal activities as told by your health care provider. Ask your health care provider what activities are safe for you.  Keep all follow-up visits as told by your health care provider. This is important. Contact a health care provider if you have:  Another gout attack.  Continuing symptoms of a gout attack after 10 days of treatment.  Side effects from your medicines.  Chills or a fever.  Burning pain when you urinate.  Pain in your lower back or belly. Get help right away if you:  Have severe or uncontrolled pain.  Cannot  urinate. Summary  Gout is painful swelling of the joints caused by inflammation.  The most common site of pain is the big toe, but it can affect other joints in the body.  Medicines and dietary changes can help to prevent and treat gout attacks. This information is not intended to replace advice given to you by your health care provider. Make sure you discuss any questions you have with your health care provider. Document Revised: 05/19/2018 Document Reviewed: 05/19/2018 Elsevier Patient Education  2021 [employer].

## 2021-02-28 NOTE — Progress Notes (Signed)
Uric acid level  is mildly elevated   kidney function  stable.  So stay hydrated and diet attention as we discussed . If  recurrent  gout  we may add  medication to lower uric acid ( goal below 6.5 or 6 )

## 2021-03-01 NOTE — Progress Notes (Signed)
Arthritis of  right great toe   and heel spur  . No unexpected  or serious findings.  No change in advice

## 2021-03-04 ENCOUNTER — Telehealth: Payer: Self-pay | Admitting: Internal Medicine

## 2021-03-04 MED ORDER — PREDNISONE 20 MG PO TABS: ORAL_TABLET | ORAL | 0 refills | Status: DC

## 2021-03-04 MED ORDER — ALLOPURINOL 100 MG PO TABS: 100.0000 mg | ORAL_TABLET | Freq: Two times a day (BID) | ORAL | 1 refills | Status: DC

## 2021-03-04 NOTE — Telephone Encounter (Signed)
Patient called stating she is having another gout flare up in her right foot. Foot is swollen, painful, warm to touch. Patient was just seen on 02/27/21 had x-ray and bloodwork done. Please advise [phone]

## 2021-03-04 NOTE — Telephone Encounter (Signed)
So we can do another course of prednisone But I advise begin allopurinol to suppress recurrences since you have had such a problem with this.  Please wait until pain symptoms are improved before beginning the allopurinol to avoid additional flares. We may need to adjust the dose of allopurinol based on the uric acid level.  In the future.  Please arrange to add uric acid level to the labs already scheduled i for the summer.  I have pended the medications please send in if she agrees

## 2021-03-04 NOTE — Telephone Encounter (Signed)
Patient has been informed of the message below. Patient agreed to try Allopurinol. RX was sent to patients pharmacy. Patient stated that her foot was feeling better as of today and she will hold off on taking Prednisone but will try Allopurinol when her foot pain has subsided.

## 2021-03-19 ENCOUNTER — Telehealth (INDEPENDENT_AMBULATORY_CARE_PROVIDER_SITE_OTHER): Payer: PPO | Admitting: Internal Medicine

## 2021-03-19 ENCOUNTER — Encounter: Payer: Self-pay | Admitting: Internal Medicine

## 2021-03-19 DIAGNOSIS — Z79899 Other long term (current) drug therapy: Secondary | ICD-10-CM | POA: Diagnosis not present

## 2021-03-19 DIAGNOSIS — M109 Gout, unspecified: Secondary | ICD-10-CM

## 2021-03-19 MED ORDER — COLCHICINE 0.6 MG PO TABS: ORAL_TABLET | ORAL | 1 refills | Status: DC

## 2021-03-19 NOTE — Progress Notes (Signed)
Virtual Visit via Video Note  I connected with@ on 03/19/21 at  1:30 PM EDT by a video enabled telemedicine application and verified that I am speaking with the correct person using two identifiers. Location patient: home Location provider:workoffice Persons participating in the virtual visit: patient, provider  [place] national recommendations  regarding COVID 19 pandemic   video visit is advised over in office visit for this patient.  Patient aware  of the limitations of evaluation and management by telemedicine and  availability of in person appointments. and agreed to proceed. Because of technical difficulties had to use 4 devices camera and 2 phones as audio again was muted and unable to become ununited  HPI: Shelby Wong presents for video visit because of early increased symptoms again in her great toe forefoot.  She has been off prednisone for over a week which totally healed her symptoms.  She has been taking allopurinol 100 mg twice daily since April 25.  No side effects reported. Yesterday or day before she started get the tingling again with some increasing discomfort knows that this is an early recurrence.  Review of record shows the initial serious pain began end of March. No systemic symptoms.  ROS: See pertinent positives and negatives per HPI.  Past Medical History:  Diagnosis Date  . Allergic rhinitis   . Anxiety   . Depression   . Diverticulosis of colon (without mention of hemorrhage)   . Elevated LFTs   . GERD (gastroesophageal reflux disease)   . Hemorrhoids   . Hiatal hernia   . Hx of echocardiogram    a. Echo 3/14:  Mild LVH, EF 55-65%, normal wall motion, PASP 33  . Hypothyroidism   . IBS (irritable bowel syndrome)   . Myocarditis (HCC)    a. NSTEMI:  12/2011 with peak troponin 2. Cath - mild nonobstructive plaque.;  b. nstemi 01/2012, relook cath - nonobs - MRI sugg of myocarditis  . Obesity   . POLYP, ANAL AND RECTAL 09/07/2007   Qualifier: Diagnosis of  By:  Lawernce Ion, CMA (AAMA), Bethann Berkshire   . Pulmonary HTN (HCC)    Enlarged pulmonary artery by CT angio 12/2011 - PAP by echo 12/2011  . Shingles 07/09/2012  . Transaminitis    Abd [place] 12/2011 - hepatic steatosis    Past Surgical History:  Procedure Laterality Date  . CHOLECYSTECTOMY    . LEFT AND RIGHT HEART CATHETERIZATION WITH CORONARY ANGIOGRAM N/A 01/16/2012   Procedure: LEFT AND RIGHT HEART CATHETERIZATION WITH CORONARY ANGIOGRAM;  Surgeon: Wendall Stade, MD;  Location: [facility] CATH LAB;  Service: Cardiovascular;  Laterality: N/A;  . LEFT HEART CATHETERIZATION WITH CORONARY ANGIOGRAM N/A 12/15/2011   Procedure: LEFT HEART CATHETERIZATION WITH CORONARY ANGIOGRAM;  Surgeon: Peter M Swaziland, MD;  Location: [facility] CATH LAB;  Service: Cardiovascular;  Laterality: N/A;  . TONSILLECTOMY AND ADENOIDECTOMY      Family History  Problem Relation Age of Onset  . Arthritis Mother        osteo  . Breast cancer Mother   . Coronary artery disease Mother   . Other Mother        hearing loss  . Diabetes Brother        50 s  . Thyroid disease Mother   . Colon cancer Neg Hx     Social History   Tobacco Use  . Smoking status: Former Smoker    Quit date: 09/11/2006    Years since quitting: 14.5  . Smokeless tobacco: Never Used  Substance Use Topics  . Alcohol use: Yes    Comment: occassional  . Drug use: No      Current Outpatient Medications:  .  acetaminophen (TYLENOL) 650 MG CR tablet, Take 1,300 mg by mouth every 8 (eight) hours., Disp: , Rfl:  .  allopurinol (ZYLOPRIM) 100 MG tablet, Take 1 tablet (100 mg total) by mouth 2 (two) times daily., Disp: 60 tablet, Rfl: 1 .  colchicine 0.6 MG tablet, 1.2 mg (two 0.6-mg tablets) orally at the first sign of a flare followed by 0.6 mg (1 tablet) one hour later;if needed, then  Take 0.6 mg once a day for gout suppression., Disp: 40 tablet, Rfl: 1 .  Esomeprazole Magnesium (NEXIUM PO), Take by mouth., Disp: , Rfl:  .  FLUoxetine (PROZAC) 10 MG capsule,  TAKE ONE CAPSULE BY MOUTH DAILY, Disp: 90 capsule, Rfl: 1 .  levothyroxine (SYNTHROID) 100 MCG tablet, TAKE ONE TABLET BY MOUTH DAILY, Disp: 90 tablet, Rfl: 0 .  lisinopril-hydrochlorothiazide (ZESTORETIC) 10-12.5 MG tablet, Take 1 tablet by mouth daily., Disp: 90 tablet, Rfl: 1 .  Loratadine 10 MG CAPS, Take 10 mg by mouth daily as needed. As needed for sinus, Disp: , Rfl:  .  predniSONE (DELTASONE) 20 MG tablet, Take 3,3,3,2,2,2,1,1,1, 1/.2 1./2 1/.2 pills per day ( 12 days taper) for gout relapse, Disp: 24 tablet, Rfl: 0  EXAM: BP Readings from Last 3 Encounters:  02/27/21 100/60  12/05/20 110/80  09/09/19 126/74    VITALS per patient if applicable:  GENERAL: alert, oriented, appears well and in no acute distress  HEENT: atraumatic, conjunttiva clear, no obvious abnormalities on inspection of external nose and ears  NECK: normal movements of the head and neck  LUNGS: on inspection no signs of respiratory distress, breathing rate appears normal, no obvious gross SOB, gasping or wheezing  CV: no obvious cyanosis  MS: moves all visible extremities slightly puffiness with pink around the right MTP as in past normal perfusion.  PSYCH/NEURO: pleasant and cooperative, no obvious depression or anxiety, speech and thought processing grossly intact Lab Results  Component Value Date   WBC 5.6 12/05/2020   HGB 12.3 12/05/2020   HCT 37.6 12/05/2020   PLT 223.0 12/05/2020   GLUCOSE 82 02/27/2021   CHOL 185 12/05/2020   TRIG 134.0 12/05/2020   HDL 43.40 12/05/2020   LDLCALC 115 (H) 12/05/2020   ALT 9 12/05/2020   AST 16 12/05/2020   NA 139 02/27/2021   K 3.9 02/27/2021   CL 100 02/27/2021   CREATININE 1.39 (H) 02/27/2021   BUN 26 (H) 02/27/2021   CO2 29 02/27/2021   TSH 1.44 12/05/2020   INR 1.16 01/16/2012   HGBA1C 6.2 12/05/2020   Lab Results  Component Value Date   LABURIC 7.4 (H) 02/27/2021     ASSESSMENT AND PLAN:  Discussed the following assessment and plan:     ICD-10-CM   1. Gout, unspecified cause, unspecified chronicity, unspecified site  M10.9   2. Medication management  Z79.899   3. Acute gout involving toe of right foot, unspecified cause  M10.9    early relapsing off prendisone  Relapsing symptoms presumed gout recently begun on allopurinol low-dose and withdrew from the prednisone suspect a recurrence needs to be on prevention anti-inflammatory we will ramping up the allopurinol. At this time add colchicine as anti-inflammatory if not improving or recurring consider seeing specialist the end goal will be to increase the allopurinol dosing as tolerated when flare is down. Contact progress at  the end of the month I will be out of the office for 2 weeks can ask for help in the meantime as indicated.  Counseled.   Expectant management and discussion of plan and treatment with opportunity to ask questions and all were answered. The patient agreed with the plan and demonstrated an understanding of the instructions.   Advised to call back or seek an in-person evaluation if worsening  or having  further concerns . Return for progress at end of may  and then plan follo wup.    Berniece Andreas, MD

## 2021-03-28 ENCOUNTER — Other Ambulatory Visit: Payer: Self-pay | Admitting: Internal Medicine

## 2021-03-29 ENCOUNTER — Telehealth: Payer: Self-pay | Admitting: Internal Medicine

## 2021-03-29 NOTE — Telephone Encounter (Signed)
Left message for patient to call back and schedule Medicare Annual Wellness Visit (AWV) either virtually or in office.   AWV-I per PALMETTO 05/10/20  please schedule at anytime with [facility] Nurse Health Advisor 1 or 2   This should be a 45 minute visit.

## 2021-05-15 ENCOUNTER — Telehealth: Payer: Self-pay | Admitting: Internal Medicine

## 2021-05-15 NOTE — Telephone Encounter (Signed)
Left message for patient to call back and schedule Medicare Annual Wellness Visit (AWV) either virtually or in office.   AWV-I per PALMETTO 05/10/20  please schedule at anytime with [facility] Nurse Health Advisor 1 or 2   This should be a 45 minute visit. 

## 2021-05-17 DIAGNOSIS — M170 Bilateral primary osteoarthritis of knee: Secondary | ICD-10-CM | POA: Diagnosis not present

## 2021-05-17 DIAGNOSIS — M1712 Unilateral primary osteoarthritis, left knee: Secondary | ICD-10-CM | POA: Diagnosis not present

## 2021-05-17 DIAGNOSIS — M1711 Unilateral primary osteoarthritis, right knee: Secondary | ICD-10-CM | POA: Diagnosis not present

## 2021-05-29 DIAGNOSIS — M170 Bilateral primary osteoarthritis of knee: Secondary | ICD-10-CM | POA: Diagnosis not present

## 2021-06-04 ENCOUNTER — Other Ambulatory Visit (INDEPENDENT_AMBULATORY_CARE_PROVIDER_SITE_OTHER): Payer: PPO

## 2021-06-04 ENCOUNTER — Other Ambulatory Visit: Payer: Self-pay

## 2021-06-04 DIAGNOSIS — E119 Type 2 diabetes mellitus without complications: Secondary | ICD-10-CM | POA: Diagnosis not present

## 2021-06-04 DIAGNOSIS — E785 Hyperlipidemia, unspecified: Secondary | ICD-10-CM | POA: Diagnosis not present

## 2021-06-04 LAB — BASIC METABOLIC PANEL
BUN: 20 mg/dL (ref 6–23)
CO2: 32 mEq/L (ref 19–32)
Calcium: 9 mg/dL (ref 8.4–10.5)
Chloride: 101 mEq/L (ref 96–112)
Creatinine, Ser: 0.97 mg/dL (ref 0.40–1.20)
GFR: 60.22 mL/min (ref >60.00)
Glucose, Bld: 96 mg/dL (ref 70–99)
Potassium: 3.6 mEq/L (ref 3.5–5.1)
Sodium: 141 mEq/L (ref 135–145)

## 2021-06-04 LAB — LIPID PANEL
Cholesterol: 178 mg/dL (ref 0–200)
HDL: 44.5 mg/dL (ref >39.00)
LDL Cholesterol: 95 mg/dL (ref 0–99)
NonHDL: 133.33
Total CHOL/HDL Ratio: 4
Triglycerides: 192 mg/dL — ABNORMAL HIGH (ref 0.0–149.0)
VLDL: 38.4 mg/dL (ref 0.0–40.0)

## 2021-06-04 LAB — HEMOGLOBIN A1C: Hgb A1c MFr Bld: 6.2 % (ref 4.6–6.5)

## 2021-06-05 DIAGNOSIS — M170 Bilateral primary osteoarthritis of knee: Secondary | ICD-10-CM | POA: Diagnosis not present

## 2021-06-05 NOTE — Progress Notes (Signed)
Kidney function and cholesterol improved Keep appointment next week to discuss.

## 2021-06-10 NOTE — Progress Notes (Signed)
Chief Complaint  Patient presents with   Follow-up     HPI: Shelby Wong 70 y.o. come in for Chronic disease management  Gout   : No recent attack is taking colchicine regularly colchicine  off and  then went on ran out of allopurinol also just stopped it. HT  controlled.  FU lipids bmp a1c has cut out sugar sodas and drinks.  Needs refill on other medicines.  Knees are bother some question left needing surgery right knee is recently given out more than once with potentially dangerous near falls.  She does have an appointment with Ortho tomorrow has been getting gel injections on the left knee.  Recent circumstances has been living in sibling and in-laws house since she lost her job a while back and now it is recommended she leave and she has discovered that she cannot afford rent by her self is looking into options including her roommate "difficult to find" Has seen her lab test and triglycerides up some. ROS: See pertinent positives and negatives per HPI.  No current chest pain shortness of breath see above  Past Medical History:  Diagnosis Date   Allergic rhinitis    Anxiety    Depression    Diverticulosis of colon (without mention of hemorrhage)    Elevated LFTs    GERD (gastroesophageal reflux disease)    Hemorrhoids    Hiatal hernia    Hx of echocardiogram    a. Echo 3/14:  Mild LVH, EF 55-65%, normal wall motion, PASP 33   Hypothyroidism    IBS (irritable bowel syndrome)    Myocarditis (HCC)    a. NSTEMI:  12/2011 with peak troponin 2. Cath - mild nonobstructive plaque.;  b. nstemi 01/2012, relook cath - nonobs - MRI sugg of myocarditis   Obesity    POLYP, ANAL AND RECTAL 09/07/2007   Qualifier: Diagnosis of  By: Lawernce Ion, CMA (AAMA), Bethann Berkshire    Pulmonary HTN (HCC)    Enlarged pulmonary artery by CT angio 12/2011 - PAP by echo 12/2011   Shingles 07/09/2012   Transaminitis    Abd [place] 12/2011 - hepatic steatosis    Family History  Problem Relation Age of Onset    Arthritis Mother        osteo   Breast cancer Mother    Coronary artery disease Mother    Other Mother        hearing loss   Diabetes Brother        53 s   Thyroid disease Mother    Colon cancer Neg Hx     Social History   Socioeconomic History   Marital status: Divorced    Spouse name: Not on file   Number of children: 1   Years of education: Not on file   Highest education level: Not on file  Occupational History   Occupation: Chemical engineer Rep  Tobacco Use   Smoking status: Former    Types: Cigarettes    Quit date: 09/11/2006    Years since quitting: 14.7   Smokeless tobacco: Never  Substance and Sexual Activity   Alcohol use: Yes    Comment: occassional   Drug use: No   Sexual activity: Not Currently    Birth control/protection: Post-menopausal  Other Topics Concern   Not on file  Social History Narrative   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  Moved in with family sister  HH of 1   Divorced   Regular exercise- no   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     Esomeprazole Magnesium (NEXIUM PO) Take by mouth.     Loratadine 10 MG CAPS Take 10 mg by mouth daily as needed. As needed for sinus     colchicine 0.6 MG tablet 1.2 mg (two 0.6-mg tablets) orally at the first sign of a flare followed by 0.6 mg (1 tablet) one hour later;if needed, then  Take 0.6 mg once a day for gout suppression. 40 tablet 1   FLUoxetine (PROZAC) 10 MG capsule TAKE ONE CAPSULE BY  MOUTH DAILY 90 capsule 1   levothyroxine (SYNTHROID) 100 MCG tablet TAKE ONE TABLET BY MOUTH DAILY 90 tablet 0   lisinopril-hydrochlorothiazide (ZESTORETIC) 10-12.5 MG tablet Take 1 tablet by mouth daily. 90 tablet 1   predniSONE (DELTASONE) 20 MG tablet Take 3,3,3,2,2,2,1,1,1, 1/.2 1./2 1/.2 pills per day ( 12 days taper) for gout relapse 24 tablet 0   allopurinol (ZYLOPRIM) 100 MG tablet Take 1 tablet (100 mg total) by mouth 2 (two) times daily. 60 tablet 1   No facility-administered medications prior to visit.     EXAM:  BP 120/76 (BP Location: Left Arm, Patient Position: Sitting, Cuff Size: Large)   Pulse 79   Temp 98.5 F (36.9 C) (Oral)   Ht 5\' 4"  (1.626 m)   Wt 250 lb (113.4 kg)   SpO2 96%   BMI 42.91 kg/m   Body mass index is 42.91 kg/m.  GENERAL: vitals reviewed and listed above, alert, oriented, appears well hydrated and in no acute distress HEENT: atraumatic, conjunctiva  clear, no obvious abnormalities on inspection of external nose and ears OP : Masked NECK: no obvious masses on inspection palpation  LUNGS: No respiratory distress CV: HRRR, no clubbing cyanosis or  peripheral edema nl cap refill  MS: moves all extremities without noticeable focal  abnormality knees not examined gait appears steady PSYCH: pleasant and cooperative, no obvious depression or anxiety Lab Results  Component Value Date   WBC 5.6 12/05/2020   HGB 12.3 12/05/2020   HCT 37.6 12/05/2020   PLT 223.0 12/05/2020   GLUCOSE 96 06/04/2021   CHOL 178 06/04/2021   TRIG 192.0 (H) 06/04/2021   HDL 44.50 06/04/2021   LDLCALC 95 06/04/2021   ALT 9 12/05/2020   AST 16 12/05/2020   NA 141 06/04/2021   K 3.6 06/04/2021   CL 101 06/04/2021   CREATININE 0.97 06/04/2021   BUN 20 06/04/2021   CO2 32 06/04/2021   TSH 1.44 12/05/2020   INR 1.16 01/16/2012   HGBA1C 6.2 06/04/2021   BP Readings from Last 3 Encounters:  06/11/21 120/76  02/27/21 100/60  12/05/20 110/80   Laboratory  review ASSESSMENT AND PLAN:  Discussed the following assessment and plan:  Medication management  Essential hypertension, benign  Gout, unspecified cause, unspecified chronicity, unspecified site - quiescnet  Hyperlipidemia, unspecified hyperlipidemia type  Hyperglycemia  Elevated uric acid in blood  High triglycerides  Distressed about housing issues She apparently misunderstood the directions for the allopurinol as preventative and has been using colchicine alone  Fortunately she has not had any recent flares Explained that her uric acid level was elevated and the allopurinol was to bring that down for prevention but to use colchicine while she is on the allopurinol and then can back off and use as needed for flares.  We will restart the allopurinol refill her other meds. Stay on colchicine  Plan lab and CPX in January In the meantime continue working on sugar control triglycerides etc. Stress from financial difficulty living with sibling family.  -Patient advised to return or notify health care team  if  new concerns arise. 35 minutes revewi disc counsel  Patient Instructions  Get back on allopurinol  to lower uric acid .     And stay on the daily colchicine  until stable.  Get the triglycerides .  Under control. By diet  and activity.  Plan   Fu labs  and  cpx in January  2023       Neta Mends. Danil Wedge M.D.

## 2021-06-11 ENCOUNTER — Other Ambulatory Visit: Payer: Self-pay

## 2021-06-11 ENCOUNTER — Ambulatory Visit (INDEPENDENT_AMBULATORY_CARE_PROVIDER_SITE_OTHER): Payer: PPO | Admitting: Internal Medicine

## 2021-06-11 ENCOUNTER — Encounter: Payer: Self-pay | Admitting: Internal Medicine

## 2021-06-11 VITALS — BP 120/76 | HR 79 | Temp 98.5°F | Ht 64.0 in | Wt 250.0 lb

## 2021-06-11 DIAGNOSIS — Z5989 Other problems related to housing and economic circumstances: Secondary | ICD-10-CM | POA: Diagnosis not present

## 2021-06-11 DIAGNOSIS — I10 Essential (primary) hypertension: Secondary | ICD-10-CM

## 2021-06-11 DIAGNOSIS — M109 Gout, unspecified: Secondary | ICD-10-CM | POA: Diagnosis not present

## 2021-06-11 DIAGNOSIS — Z79899 Other long term (current) drug therapy: Secondary | ICD-10-CM | POA: Diagnosis not present

## 2021-06-11 DIAGNOSIS — E785 Hyperlipidemia, unspecified: Secondary | ICD-10-CM | POA: Diagnosis not present

## 2021-06-11 DIAGNOSIS — R739 Hyperglycemia, unspecified: Secondary | ICD-10-CM | POA: Diagnosis not present

## 2021-06-11 DIAGNOSIS — E790 Hyperuricemia without signs of inflammatory arthritis and tophaceous disease: Secondary | ICD-10-CM

## 2021-06-11 DIAGNOSIS — E781 Pure hyperglyceridemia: Secondary | ICD-10-CM

## 2021-06-11 MED ORDER — LEVOTHYROXINE SODIUM 100 MCG PO TABS: 100.0000 ug | ORAL_TABLET | Freq: Every day | ORAL | 0 refills | Status: DC

## 2021-06-11 MED ORDER — COLCHICINE 0.6 MG PO TABS: ORAL_TABLET | ORAL | 1 refills | Status: DC

## 2021-06-11 MED ORDER — FLUOXETINE HCL 10 MG PO CAPS: ORAL_CAPSULE | ORAL | 1 refills | Status: DC

## 2021-06-11 MED ORDER — ALLOPURINOL 100 MG PO TABS: 100.0000 mg | ORAL_TABLET | Freq: Two times a day (BID) | ORAL | 5 refills | Status: DC

## 2021-06-11 MED ORDER — LISINOPRIL-HYDROCHLOROTHIAZIDE 10-12.5 MG PO TABS: 1.0000 | ORAL_TABLET | Freq: Every day | ORAL | 1 refills | Status: DC

## 2021-06-11 NOTE — Patient Instructions (Addendum)
Get back on allopurinol  to lower uric acid .     And stay on the daily colchicine  until stable.  Get the triglycerides .  Under control. By diet  and activity.  Plan   Fu labs  and  cpx in January  2023

## 2021-06-12 DIAGNOSIS — M170 Bilateral primary osteoarthritis of knee: Secondary | ICD-10-CM | POA: Diagnosis not present

## 2021-06-12 IMAGING — DX DG FOOT COMPLETE 3+V*R*
3 series · 3 of 3 positions shown · non-contrast
Comparison: None.

CLINICAL DATA: Right foot pain x1 month.

EXAM:
RIGHT FOOT COMPLETE - 3+ VIEW

[foot ap]
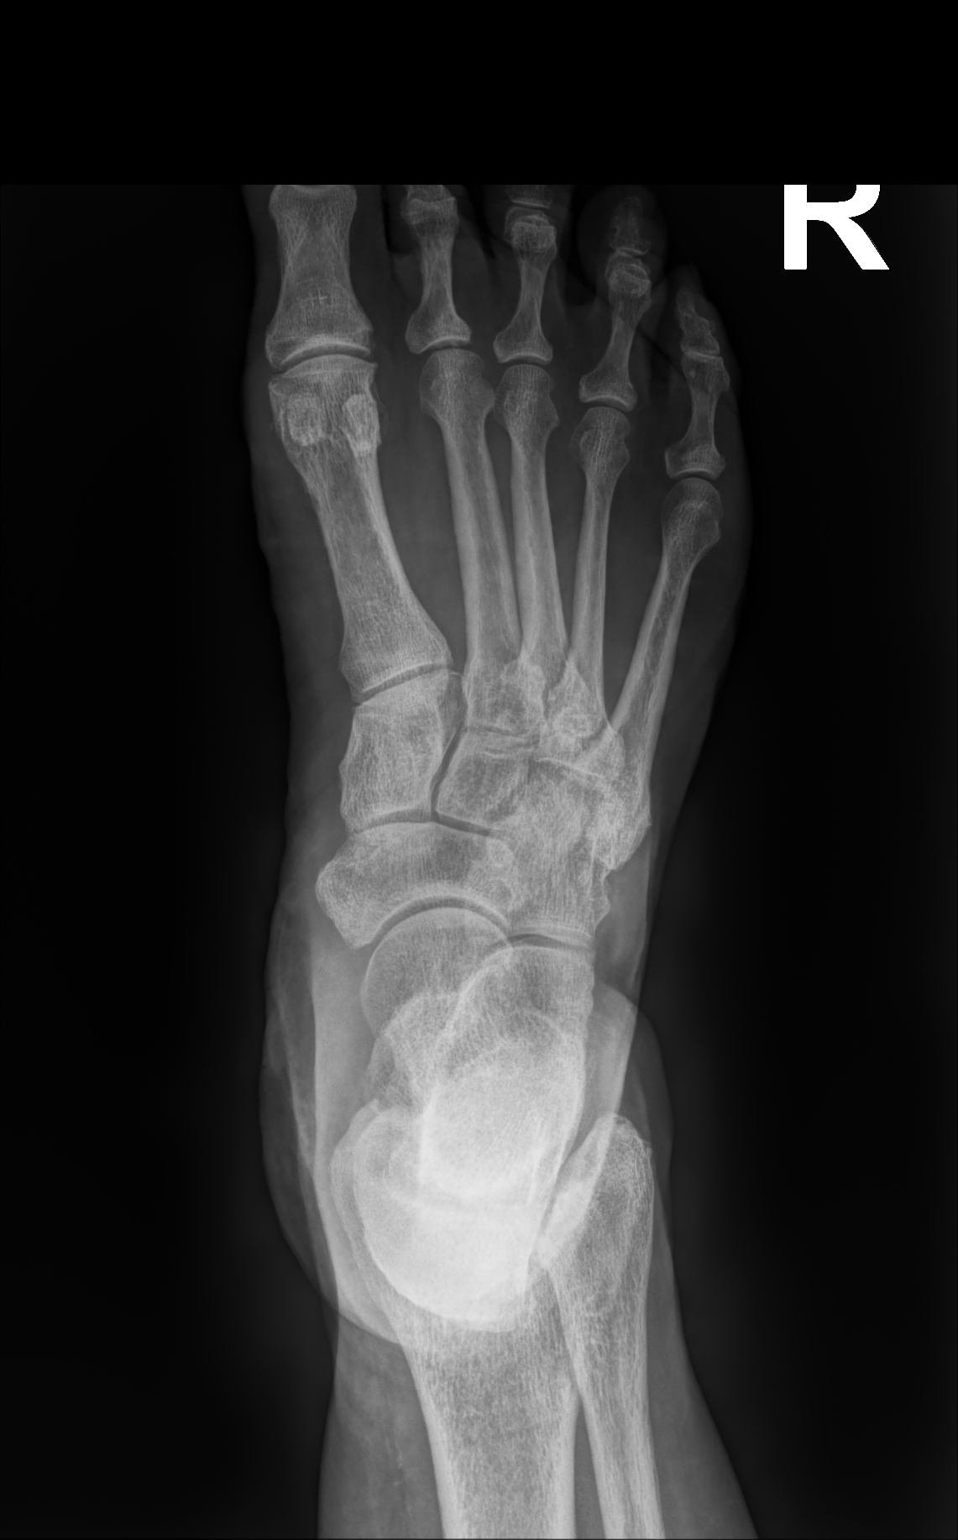

[foot mlo]
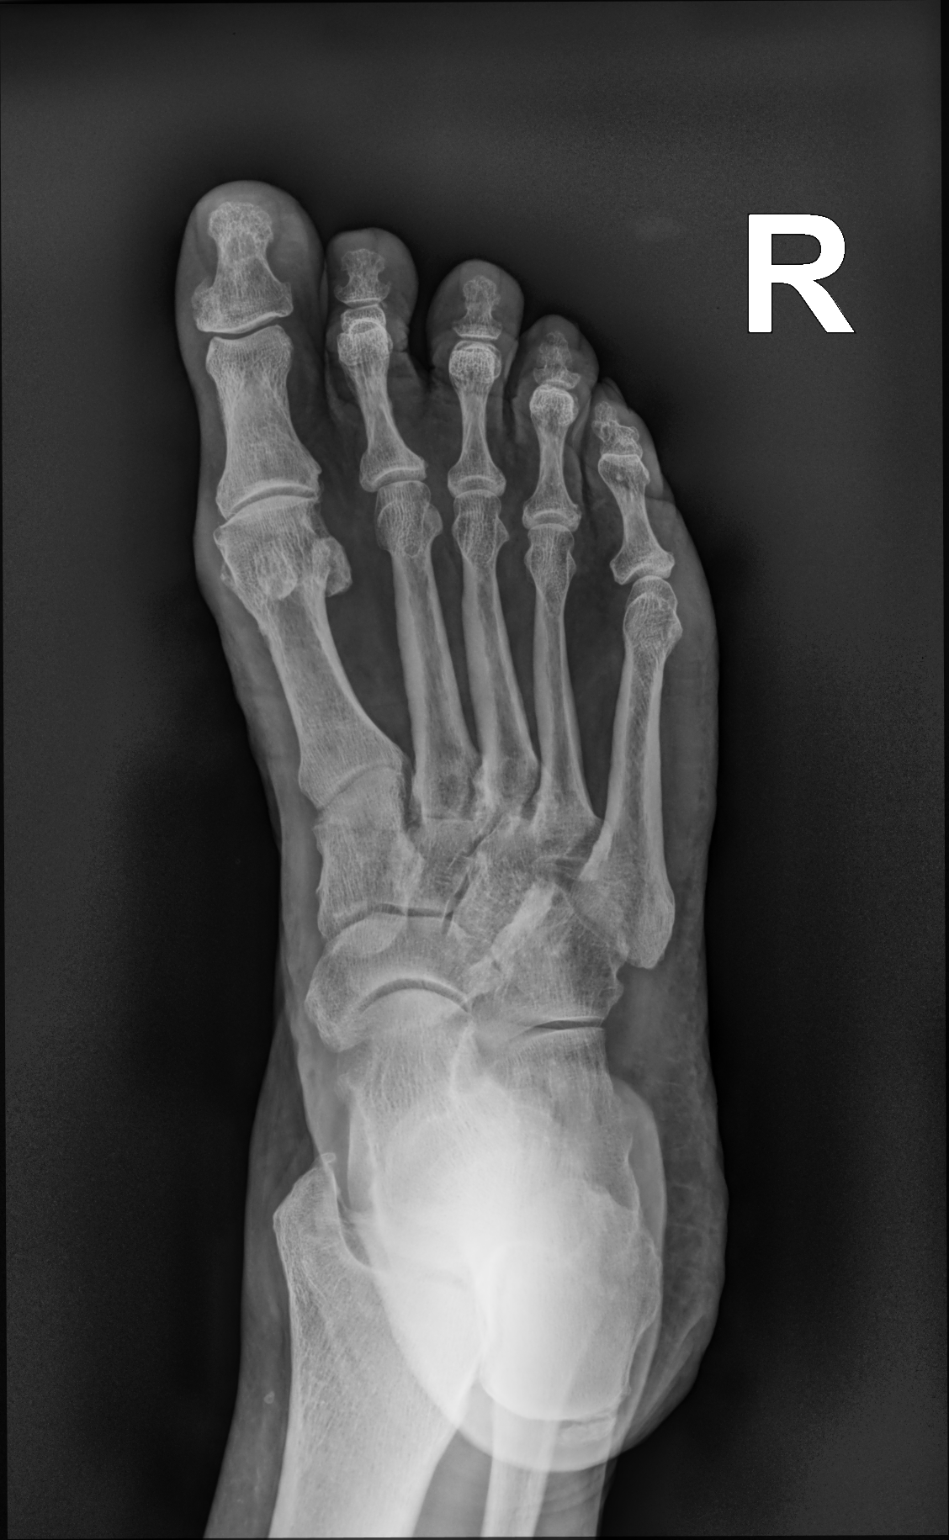

[foot lat]
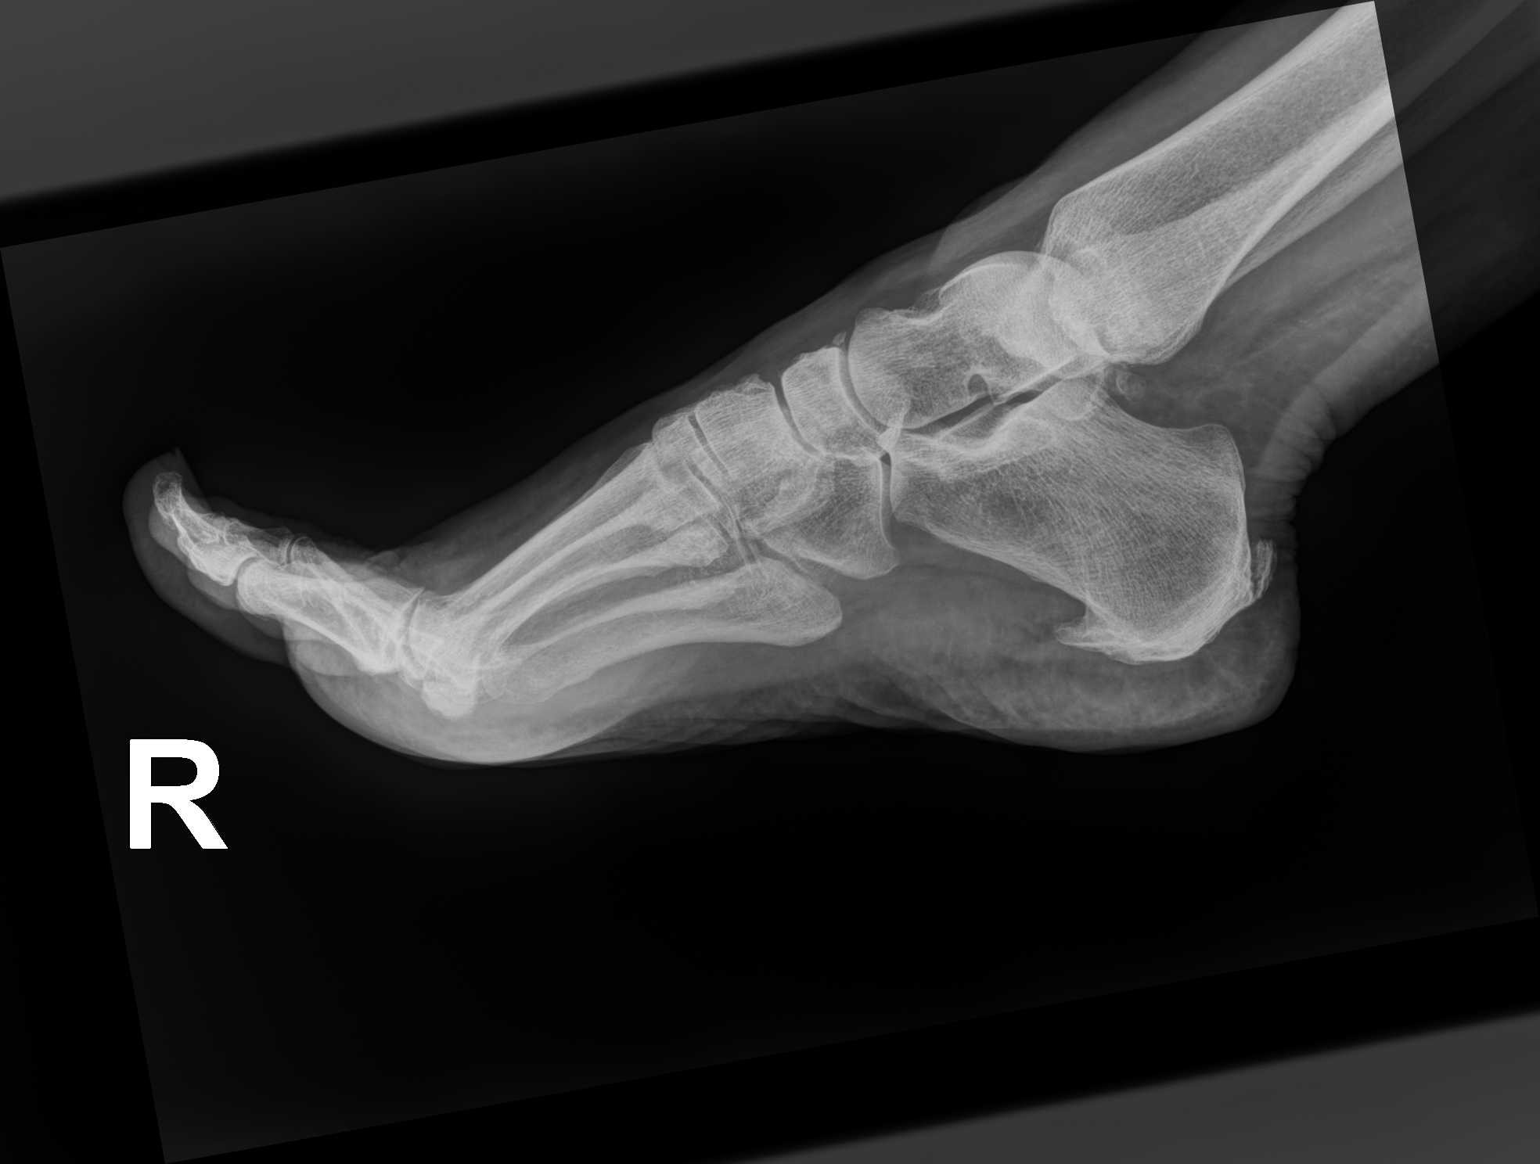

[3 of 3 positions shown; findings below may reference images not displayed]

FINDINGS: There is no evidence of fracture or dislocation. Mild degenerative
changes seen involving the metatarsophalangeal joint of the right
great toe and along the dorsal aspect of the mid right foot. A large
plantar calcaneal spur is seen. Soft tissues are unremarkable.
IMPRESSION: 1. Mild degenerative changes, as described above.
2. Large plantar calcaneal spur.

## 2021-06-19 ENCOUNTER — Other Ambulatory Visit: Payer: Self-pay

## 2021-06-19 MED ORDER — LISINOPRIL-HYDROCHLOROTHIAZIDE 10-12.5 MG PO TABS: 1.0000 | ORAL_TABLET | Freq: Every day | ORAL | 1 refills | Status: DC

## 2021-06-20 ENCOUNTER — Telehealth: Payer: Self-pay | Admitting: Internal Medicine

## 2021-06-20 NOTE — Telephone Encounter (Signed)
Left message for patient to call back and schedule Medicare Annual Wellness Visit (AWV) either virtually or in office.   Left both  my jabber number [phone] and office number    AWV-I per PALMETTO 05/10/20 please schedule at anytime with [facility] Nurse Health Advisor 1 or 2   This should be a 45 minute visit.

## 2021-06-29 ENCOUNTER — Other Ambulatory Visit: Payer: Self-pay | Admitting: Internal Medicine

## 2021-07-07 DIAGNOSIS — M25561 Pain in right knee: Secondary | ICD-10-CM | POA: Diagnosis not present

## 2021-07-11 ENCOUNTER — Other Ambulatory Visit: Payer: Self-pay

## 2021-07-11 MED ORDER — FLUOXETINE HCL 10 MG PO CAPS: ORAL_CAPSULE | ORAL | 1 refills | Status: DC

## 2021-07-18 DIAGNOSIS — M170 Bilateral primary osteoarthritis of knee: Secondary | ICD-10-CM | POA: Diagnosis not present

## 2021-07-26 DIAGNOSIS — M1712 Unilateral primary osteoarthritis, left knee: Secondary | ICD-10-CM | POA: Diagnosis not present

## 2021-07-26 DIAGNOSIS — M1711 Unilateral primary osteoarthritis, right knee: Secondary | ICD-10-CM | POA: Diagnosis not present

## 2021-07-30 DIAGNOSIS — M1711 Unilateral primary osteoarthritis, right knee: Secondary | ICD-10-CM | POA: Diagnosis not present

## 2021-07-30 DIAGNOSIS — M1712 Unilateral primary osteoarthritis, left knee: Secondary | ICD-10-CM | POA: Diagnosis not present

## 2021-08-02 DIAGNOSIS — M25562 Pain in left knee: Secondary | ICD-10-CM | POA: Diagnosis not present

## 2021-08-02 DIAGNOSIS — M25561 Pain in right knee: Secondary | ICD-10-CM | POA: Diagnosis not present

## 2021-08-06 DIAGNOSIS — M25562 Pain in left knee: Secondary | ICD-10-CM | POA: Diagnosis not present

## 2021-08-06 DIAGNOSIS — M25561 Pain in right knee: Secondary | ICD-10-CM | POA: Diagnosis not present

## 2021-08-13 DIAGNOSIS — M25562 Pain in left knee: Secondary | ICD-10-CM | POA: Diagnosis not present

## 2021-08-13 DIAGNOSIS — M25561 Pain in right knee: Secondary | ICD-10-CM | POA: Diagnosis not present

## 2021-08-15 DIAGNOSIS — M1712 Unilateral primary osteoarthritis, left knee: Secondary | ICD-10-CM | POA: Diagnosis not present

## 2021-08-15 DIAGNOSIS — M25562 Pain in left knee: Secondary | ICD-10-CM | POA: Diagnosis not present

## 2021-08-15 DIAGNOSIS — M1711 Unilateral primary osteoarthritis, right knee: Secondary | ICD-10-CM | POA: Diagnosis not present

## 2021-08-15 DIAGNOSIS — M25561 Pain in right knee: Secondary | ICD-10-CM | POA: Diagnosis not present

## 2021-08-27 DIAGNOSIS — M25562 Pain in left knee: Secondary | ICD-10-CM | POA: Diagnosis not present

## 2021-08-27 DIAGNOSIS — M25561 Pain in right knee: Secondary | ICD-10-CM | POA: Diagnosis not present

## 2021-09-04 DIAGNOSIS — M1712 Unilateral primary osteoarthritis, left knee: Secondary | ICD-10-CM | POA: Diagnosis not present

## 2021-09-04 DIAGNOSIS — M1711 Unilateral primary osteoarthritis, right knee: Secondary | ICD-10-CM | POA: Diagnosis not present

## 2021-09-10 ENCOUNTER — Other Ambulatory Visit: Payer: Self-pay

## 2021-09-10 MED ORDER — COLCHICINE 0.6 MG PO TABS: ORAL_TABLET | ORAL | 1 refills | Status: DC

## 2021-09-13 DIAGNOSIS — M1711 Unilateral primary osteoarthritis, right knee: Secondary | ICD-10-CM | POA: Diagnosis not present

## 2021-09-13 DIAGNOSIS — M1712 Unilateral primary osteoarthritis, left knee: Secondary | ICD-10-CM | POA: Diagnosis not present

## 2021-09-16 DIAGNOSIS — M170 Bilateral primary osteoarthritis of knee: Secondary | ICD-10-CM | POA: Diagnosis not present

## 2021-09-25 ENCOUNTER — Other Ambulatory Visit: Payer: Self-pay | Admitting: Internal Medicine

## 2021-09-29 ENCOUNTER — Other Ambulatory Visit: Payer: Self-pay | Admitting: Internal Medicine

## 2021-09-30 ENCOUNTER — Other Ambulatory Visit: Payer: Self-pay

## 2021-09-30 MED ORDER — LEVOTHYROXINE SODIUM 100 MCG PO TABS: 100.0000 ug | ORAL_TABLET | Freq: Every day | ORAL | 0 refills | Status: DC

## 2021-09-30 NOTE — Telephone Encounter (Signed)
Ok to Armed forces logistics/support/administrative officer for her thyroid medication  Lab Results  Component Value Date   TSH 1.44 12/05/2020   She should be due for lab work and January  anyway. Lab orders are in the system to get blood work previsit

## 2021-09-30 NOTE — Addendum Note (Signed)
Addended by: Christy Sartorius on: 09/30/2021 03:29 PM   Modules accepted: Orders

## 2021-09-30 NOTE — Telephone Encounter (Signed)
Patient called stating that manufacture has changed for Rx and wanted to make sure PCP is aware and is in need of a refill by the end of the week levothyroxine (SYNTHROID) 100 MCG tablet

## 2021-11-12 ENCOUNTER — Ambulatory Visit (INDEPENDENT_AMBULATORY_CARE_PROVIDER_SITE_OTHER): Payer: PPO | Admitting: Internal Medicine

## 2021-11-12 VITALS — BP 118/70 | HR 86 | Temp 98.6°F | Ht 64.0 in | Wt 250.0 lb

## 2021-11-12 DIAGNOSIS — E039 Hypothyroidism, unspecified: Secondary | ICD-10-CM

## 2021-11-12 DIAGNOSIS — Z0000 Encounter for general adult medical examination without abnormal findings: Secondary | ICD-10-CM | POA: Diagnosis not present

## 2021-11-12 DIAGNOSIS — M25569 Pain in unspecified knee: Secondary | ICD-10-CM

## 2021-11-12 DIAGNOSIS — R739 Hyperglycemia, unspecified: Secondary | ICD-10-CM | POA: Diagnosis not present

## 2021-11-12 DIAGNOSIS — I10 Essential (primary) hypertension: Secondary | ICD-10-CM

## 2021-11-12 DIAGNOSIS — G8929 Other chronic pain: Secondary | ICD-10-CM | POA: Diagnosis not present

## 2021-11-12 DIAGNOSIS — Z79899 Other long term (current) drug therapy: Secondary | ICD-10-CM

## 2021-11-12 DIAGNOSIS — M109 Gout, unspecified: Secondary | ICD-10-CM

## 2021-11-12 DIAGNOSIS — E785 Hyperlipidemia, unspecified: Secondary | ICD-10-CM

## 2021-11-12 DIAGNOSIS — E790 Hyperuricemia without signs of inflammatory arthritis and tophaceous disease: Secondary | ICD-10-CM | POA: Diagnosis not present

## 2021-11-12 LAB — CBC WITH DIFFERENTIAL/PLATELET
Basophils Absolute: 0 10*3/uL (ref 0.0–0.1)
Basophils Relative: 0.7 % (ref 0.0–3.0)
Eosinophils Absolute: 0.2 10*3/uL (ref 0.0–0.7)
Eosinophils Relative: 3.1 % (ref 0.0–5.0)
HCT: 39.2 % (ref 36.0–46.0)
Hemoglobin: 12.7 g/dL (ref 12.0–15.0)
Lymphocytes Relative: 30.2 % (ref 12.0–46.0)
Lymphs Abs: 2 10*3/uL (ref 0.7–4.0)
MCHC: 32.3 g/dL (ref 30.0–36.0)
MCV: 95.1 fl (ref 78.0–100.0)
Monocytes Absolute: 0.7 10*3/uL (ref 0.1–1.0)
Monocytes Relative: 10.2 % (ref 3.0–12.0)
Neutro Abs: 3.6 10*3/uL (ref 1.4–7.7)
Neutrophils Relative %: 55.8 % (ref 43.0–77.0)
Platelets: 232 10*3/uL (ref 150.0–400.0)
RBC: 4.12 Mil/uL (ref 3.87–5.11)
RDW: 14.8 % (ref 11.5–15.5)
WBC: 6.5 10*3/uL (ref 4.0–10.5)

## 2021-11-12 LAB — LIPID PANEL
Cholesterol: 187 mg/dL (ref 0–200)
HDL: 43.1 mg/dL (ref >39.00)
LDL Cholesterol: 121 mg/dL — ABNORMAL HIGH (ref 0–99)
NonHDL: 144.17
Total CHOL/HDL Ratio: 4
Triglycerides: 118 mg/dL (ref 0.0–149.0)
VLDL: 23.6 mg/dL (ref 0.0–40.0)

## 2021-11-12 LAB — HEPATIC FUNCTION PANEL
ALT: 11 U/L (ref 0–35)
AST: 26 U/L (ref 0–37)
Albumin: 4.2 g/dL (ref 3.5–5.2)
Alkaline Phosphatase: 67 U/L (ref 39–117)
Bilirubin, Direct: 0.2 mg/dL (ref 0.0–0.3)
Total Bilirubin: 0.8 mg/dL (ref 0.2–1.2)
Total Protein: 7.3 g/dL (ref 6.0–8.3)

## 2021-11-12 LAB — URIC ACID: Uric Acid, Serum: 5.4 mg/dL (ref 2.4–7.0)

## 2021-11-12 LAB — BASIC METABOLIC PANEL
BUN: 28 mg/dL — ABNORMAL HIGH (ref 6–23)
CO2: 30 mEq/L (ref 19–32)
Calcium: 9.7 mg/dL (ref 8.4–10.5)
Chloride: 100 mEq/L (ref 96–112)
Creatinine, Ser: 1.19 mg/dL (ref 0.40–1.20)
GFR: 46.97 mL/min — ABNORMAL LOW (ref >60.00)
Glucose, Bld: 105 mg/dL — ABNORMAL HIGH (ref 70–99)
Potassium: 4 mEq/L (ref 3.5–5.1)
Sodium: 139 mEq/L (ref 135–145)

## 2021-11-12 LAB — HEMOGLOBIN A1C: Hgb A1c MFr Bld: 6.5 % (ref 4.6–6.5)

## 2021-11-12 LAB — TSH: TSH: 3.06 u[IU]/mL (ref 0.35–5.50)

## 2021-11-12 MED ORDER — COLCHICINE 0.6 MG PO TABS: ORAL_TABLET | ORAL | 1 refills | Status: DC

## 2021-11-12 NOTE — Patient Instructions (Addendum)
Good to see you  today  BP is good today .  Get your colonoscopy and mammogram .  Before moving preferably .  Flu vaccine today .  If possible  plan 2 mos in between  covid  shots.  To get best immunity .   Shelby Wong is a bit stronger than pfizer   Pneumovax.  23 also due.  Jan 26  after any time.  Stay on allopurinol for suppression of uric acid level.  Colchicine as needed for gout flares ( antiinflammatory)  No change in medication.   Can make a fu visit before moving or as needed

## 2021-11-12 NOTE — Progress Notes (Signed)
Chief Complaint  Patient presents with   Annual Exam    Wants flu vaccine, but has questions on if they need to be spaced out from COVID vaccinations.     HPI: Patient  Shelby Wong  71 y.o. comes in today for Preventive Health Care visit  Going to move  to [place]  best friend   housing  difficulty . No new concerns x  joint difficulty limiting exercise and activity . No cp sob  On thyroid meds  To get flu vaccine and  covid vaccine  since she will be moving.   Had been declining with her hx of pericarditis .   Has not had natural infection Still hasn't gotten mammo or colon screen  no sx    Health Maintenance  Topic Date Due   COVID-19 Vaccine (1) Never done   Zoster Vaccines- Shingrix (1 of 2) Never done   MAMMOGRAM  Never done   COLONOSCOPY (Pts 45-85yrs Insurance coverage will need to be confirmed)  01/30/2018   DEXA SCAN  Never done   TETANUS/TDAP  04/01/2020   INFLUENZA VACCINE  06/10/2021   Pneumonia Vaccine 68+ Years old (2 - PPSV23 if available, else PCV20) 12/05/2021   Hepatitis C Screening  Completed   HPV VACCINES  Aged Out   Health Maintenance Review LIFESTYLE:  Exercise:   left knee arthritis  surgical candidate.  Tobacco/ETS:  n Alcohol:    blue moon Sugar beverages: no Sleep:about   irreg   8 hours  Drug use: no HH of lving with family   ROS:  no cp sob  bleeding   REST of 12 system review negative except as per HPI   Past Medical History:  Diagnosis Date   Allergic rhinitis    Anxiety    Depression    Diverticulosis of colon (without mention of hemorrhage)    Elevated LFTs    GERD (gastroesophageal reflux disease)    Hemorrhoids    Hiatal hernia    Hx of echocardiogram    a. Echo 3/14:  Mild LVH, EF 55-65%, normal wall motion, PASP 33   Hypothyroidism    IBS (irritable bowel syndrome)    Myocarditis (HCC)    a. NSTEMI:  12/2011 with peak troponin 2. Cath - mild nonobstructive plaque.;  b. nstemi 01/2012, relook cath - nonobs - MRI sugg of  myocarditis   Obesity    POLYP, ANAL AND RECTAL 09/07/2007   Qualifier: Diagnosis of  By: Lawernce Ion, CMA (AAMA), Bethann Berkshire    Pulmonary HTN (HCC)    Enlarged pulmonary artery by CT angio 12/2011 - PAP by echo 12/2011   Shingles 07/09/2012   Transaminitis    Abd [place] 12/2011 - hepatic steatosis    Past Surgical History:  Procedure Laterality Date   CHOLECYSTECTOMY     LEFT AND RIGHT HEART CATHETERIZATION WITH CORONARY ANGIOGRAM N/A 01/16/2012   Procedure: LEFT AND RIGHT HEART CATHETERIZATION WITH CORONARY ANGIOGRAM;  Surgeon: Wendall Stade, MD;  [facility] C[facility] LAB;  Service: Cardiovascular;  Laterality: N/A;   LEFT HEART CATHETERIZATION WITH CORONARY ANGIOGRAM N/A 12/15/2011   Procedure: LEFT HEART CATHETERIZATION WITH CORONARY ANGIOGRAM;  Surgeon: Peter M Swaziland, MD;  Location: [facility] CATH LAB;  Service: Cardiovascular;  Laterality: N/A;   TONSILLECTOMY AND ADENOIDECTOMY      Family History  Problem Relation Age of Onset   Arthritis Mother        osteo   Breast cancer Mother    Coronary artery disease  Mother    Other Mother        hearing loss   Diabetes Brother        67 s   Thyroid disease Mother    Colon cancer Neg Hx     Social History   Socioeconomic History   Marital status: Divorced    Spouse name: Not on file   Number of children: 1   Years of education: Not on file   Highest education level: Not on file  Occupational History   Occupation: Insurance underwriter Rep  Tobacco Use   Smoking status: Former    Types: Cigarettes    Quit date: 09/11/2006    Years since quitting: 15.1   Smokeless tobacco: Never  Substance and Sexual Activity   Alcohol use: Yes    Comment: occassional   Drug use: No   Sexual activity: Not Currently    Birth control/protection: Post-menopausal  Other Topics Concern   Not on file  Social History Narrative   Occupation: Insurance underwriter Rep  [employer]   40 hours per week.laid off 4 15 no insurance  Moved in with family sister    HH of 1    Divorced   Regular exercise- no   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     allopurinol (ZYLOPRIM) 100 MG tablet Take 1 tablet (100 mg total) by mouth 2 (two) times daily. 60 tablet 5   Esomeprazole Magnesium (NEXIUM PO) Take by mouth.     FLUoxetine (PROZAC) 10 MG capsule TAKE ONE CAPSULE BY MOUTH DAILY 90 capsule 1   levothyroxine (SYNTHROID) 100 MCG tablet Take 1 tablet (100 mcg total) by mouth daily. 90 tablet 0   lisinopril-hydrochlorothiazide (ZESTORETIC) 10-12.5 MG tablet Take 1 tablet by mouth daily. 90 tablet 1   Loratadine 10 MG CAPS Take 10 mg by mouth daily as needed. As needed for sinus     colchicine 0.6 MG tablet 1.2 mg (two 0.6-mg tablets) orally at the first sign of a flare followed by 0.6 mg (1 tablet) one hour later;if needed, then  Take 0.6 mg once a day for gout suppression. 40 tablet 1   No facility-administered medications prior to visit.     EXAM:  BP 118/70 (BP Location: Left Arm, Patient Position: Sitting, Cuff Size: Large)    Pulse 86    Temp 98.6 F (37 C) (Oral)    Ht 5\' 4"  (1.626 m)    Wt 250 lb (113.4 kg)    SpO2 97%    BMI 42.91 kg/m   Body mass index is 42.91 kg/m. Wt Readings from Last 3 Encounters:  11/12/21 250 lb (113.4 kg)  06/11/21 250 lb (113.4 kg)  02/27/21 239 lb 6.4 oz (108.6 kg)    Physical Exam: Vital signs reviewed [id number] is a well-developed well-nourished alert cooperative    who appearsr stated age in no acute distress.  HEENT: normocephalic atraumatic , Eyes: PERRL EOM's  full, conjunctiva  clear, Nares: paten,t no deformity discharge or tenderness., Ears: no deformity EAC's clear TMs with normal landmarks. Mouth: masked r. NECK: supple without masses, thyromegaly or bruits. CHEST/PULM:  Clear to auscultation and percussion breath sounds equal no wheeze , rales or rhonchi. No chest wall deformities or tenderness. Breast: normal by inspection . No dimpling, discharge, masses, tenderness or discharge . CV: PMI is nondisplaced, S1 S2 no gallops, murmurs, rubs. Peripheral pulses are present without delay.No JVD .  ABDOMEN: Bowel sounds normal nontender  No guard or rebound, no hepato splenomegal no CVA tenderness.   Extremtities:  No clubbing cyanosis or edema, no acute joint swelling or redness no focal atrophy djd knees  NEURO:  Oriented x3, cranial nerves 3-12 appear to be intact, no obvious focal weakness,gait antalgic  some help steady to get on exam table  SKIN: No acute rashes normal turgor, color, no bruising or petechiae. PSYCH: Oriented, good eye contact, no obvious depression anxiety, cognition and judgment appear normal. LN: no cervical axillaryadenopathy  Lab Results  Component Value Date   WBC 6.5 11/12/2021   HGB 12.7 11/12/2021   HCT 39.2 11/12/2021   PLT 232.0 11/12/2021   GLUCOSE 105 (H) 11/12/2021   CHOL 187 11/12/2021   TRIG 118.0 11/12/2021   HDL 43.10 11/12/2021   LDLCALC 121 (H) 11/12/2021   ALT 11 11/12/2021   AST 26 11/12/2021   NA 139 11/12/2021   K 4.0 11/12/2021   CL 100 11/12/2021   CREATININE 1.19 11/12/2021   BUN 28 (H) 11/12/2021   CO2 30 11/12/2021   TSH 3.06 11/12/2021   INR 1.16 01/16/2012   HGBA1C 6.5 11/12/2021    BP Readings from Last 3 Encounters:  11/12/21 118/70  06/11/21 120/76  02/27/21 100/60    Lab plan reviewed with patient   ASSESSMENT AND PLAN:  Discussed the following assessment and plan:    ICD-10-CM   1. Visit for preventive health examination  Z00.00     2. Medication management  Z79.899  TSH    Basic metabolic panel    Hemoglobin A1c    Lipid panel    Hepatic function panel    CBC with Differential/Platelet    Uric acid    Uric acid    CBC with Differential/Platelet    Hepatic function panel    Lipid panel    Hemoglobin 123456    Basic metabolic panel    TSH    3. Essential hypertension, benign  I10 TSH    Basic metabolic panel    Hemoglobin A1c    Lipid panel    Hepatic function panel    CBC with Differential/Platelet    Uric acid    Uric acid    CBC with Differential/Platelet    Hepatic function panel    Lipid panel    Hemoglobin 123456    Basic metabolic panel    TSH    4. Gout, unspecified cause, unspecified chronicity, unspecified site  M10.9 TSH    Basic metabolic panel    Hemoglobin A1c    Lipid panel    Hepatic function panel    CBC with Differential/Platelet    Uric acid    Uric acid    CBC with Differential/Platelet    Hepatic function panel    Lipid panel    Hemoglobin 123456    Basic metabolic panel    TSH    5. Hyperlipidemia, unspecified hyperlipidemia type  E78.5 TSH    Basic metabolic panel  Hemoglobin A1c    Lipid panel    Hepatic function panel    CBC with Differential/Platelet    Uric acid    Uric acid    CBC with Differential/Platelet    Hepatic function panel    Lipid panel    Hemoglobin 123456    Basic metabolic panel    TSH    6. Hyperglycemia  R73.9 TSH    Basic metabolic panel    Hemoglobin A1c    Lipid panel    Hepatic function panel    CBC with Differential/Platelet    Uric acid    Uric acid    CBC with Differential/Platelet    Hepatic function panel    Lipid panel    Hemoglobin 123456    Basic metabolic panel    TSH    7. Elevated uric acid in blood  E79.0 TSH    Basic metabolic panel    Hemoglobin A1c    Lipid panel    Hepatic function panel    CBC with Differential/Platelet    Uric acid    Uric acid    CBC with Differential/Platelet    Hepatic function panel    Lipid panel    Hemoglobin A1c     Basic metabolic panel    TSH    8. Hypothyroidism, unspecified type  E03.9 TSH    Basic metabolic panel    Hemoglobin A1c    Lipid panel    Hepatic function panel    CBC with Differential/Platelet    Uric acid    Uric acid    CBC with Differential/Platelet    Hepatic function panel    Lipid panel    Hemoglobin 123456    Basic metabolic panel    TSH    9. Chronic knee pain, unspecified laterality  M25.569 TSH   0000000 Basic metabolic panel    Hemoglobin A1c    Lipid panel    Hepatic function panel    CBC with Differential/Platelet    Uric acid    Uric acid    CBC with Differential/Platelet    Hepatic function panel    Lipid panel    Hemoglobin 123456    Basic metabolic panel    TSH    10. Morbid obesity ([place])  E66.01       Get mammogram   overdue  . Colon cancer  screening Same meds for now  Disc  vaccine and proceed  flu  pneumo  covid risk  benefit   problem list  lists STEMI : however  she does not have ischemic CAD  but had myopericarditis  and currently no cv sx  : left heart cath was done 2013  Can use colchicine  if needed for gout flare  remain on allopurinol for now  Return for before moving as indicated , depending on results.  Patient Care Team: Burnis Medin, MD as PCP - General Sable Feil, MD (Gastroenterology) Sherren Mocha, MD (Cardiology) Patient Instructions  Good to see you  today  BP is good today .  Get your colonoscopy and mammogram .  Before moving preferably .  Flu vaccine today .  If possible  plan 2 mos in between  covid  shots.  To get best immunity .   Levan Hurst is a bit stronger than pfizer   Pneumovax.  23 also due.  Jan 26  after any time.  Stay on allopurinol for suppression of uric acid level.  Colchicine as needed for  gout flares ( antiinflammatory)  No change in medication.   Can make a fu visit before moving or as needed    Mariann Laster K. Yostin Malacara M.D.  Copy from note cards 2013-2014 She has presented twice now with  typical symptoms of acute coronary syndrome and cardiac enzyme elevation. She's undergone cardiac catheterization x2, demonstrating no significant CAD. Her echocardiogram studies have showed no significant LV dysfunction. She had a cardiac MRI study to evaluate for findings of myocarditis and this showed punctate hyperenhancement in the mid epicardium, possibly consistent with focal myocarditis. The patient did not tolerate nonsteroidal anti-inflammatories. She remains on once daily colchicine.

## 2021-11-17 ENCOUNTER — Encounter: Payer: Self-pay | Admitting: Internal Medicine

## 2021-11-17 NOTE — Progress Notes (Signed)
Thyroid in range ,same dose of med,  blood sugar in borderline diabetes range .kidney function  slightly decrease  but better than  in past the same . Blood count is normal .   Intensify lifestyle interventions.cut out sugar and simple carbs .  If still in area we can repeat sugar level, Hg a1c in 3  months  Consider adding  lipid lowering medication.

## 2021-11-19 ENCOUNTER — Other Ambulatory Visit: Payer: Self-pay

## 2021-11-19 ENCOUNTER — Telehealth: Payer: Self-pay | Admitting: Internal Medicine

## 2021-11-19 MED ORDER — ALLOPURINOL 100 MG PO TABS: 100.0000 mg | ORAL_TABLET | Freq: Two times a day (BID) | ORAL | 5 refills | Status: DC

## 2021-11-19 NOTE — Telephone Encounter (Signed)
Pt is returning lavon call concerning blood work results 

## 2021-12-06 ENCOUNTER — Other Ambulatory Visit: Payer: Self-pay | Admitting: Internal Medicine

## 2021-12-09 NOTE — Telephone Encounter (Signed)
Last Ov 11/12/2021 Ok to refill?

## 2021-12-11 NOTE — Telephone Encounter (Signed)
Ok to refill x 6 mos °

## 2021-12-20 DIAGNOSIS — Z23 Encounter for immunization: Secondary | ICD-10-CM | POA: Diagnosis not present

## 2022-01-16 ENCOUNTER — Telehealth: Payer: Self-pay

## 2022-01-16 NOTE — Telephone Encounter (Signed)
--  Caller states she had her 2nd Covid shot Saturday, ?and she has been having a burning sensation in her ?chest that is reminiscent to when she had myocarditis. ?Pt denies SOB or chest pain currently, but when asked ?again she stated the burning is constant. ? ?Comments ?User: Clarita Crane, RN Date/Time Lamount Cohen Time): 01/15/2022 3:28:07 PM ?Pt is convinced she has myocarditis despite her symptoms not being the same. ? ?01/15/2022 3:29:44 PM Call EMS 911 Now Durwin Nora, RN, Tresa Endo ? ?01/15/2022 3:30:41 PM 911 Outcome Documentation Durwin Nora, RN, Tresa Endo ?Reason: Pt stated she will get a ride to ?the hospital ED but will not call 911. ? ?Referrals ?GO TO FACILITY UNDECIDED ? ?01/16/22 1036: Attempted to call pt to check on her & determine the need for OV. LVM instructions for pt to call back to speak to LaVon or Williamson. ?

## 2022-02-22 NOTE — Progress Notes (Signed)
?  ?Cardiology Office Note ? ? ?Date:  02/27/2022  ? ?ID:  Shelby Wong, DOB 1951/06/09, MRN [id number] ? ?PCP:  Shelby Headings, MD  ?Cardiologist:   Shelby Cavallaro [place], MD  ? ?No chief complaint on file. ? ? ?  ?History of Present Illness: ?Shelby Wong is a 71 y.o. female who is seen at the request of Dr Fabian Sharp for evaluation with history of myocarditis. She was seen remotely by Dr Excell Seltzer. Last seen in 2013. She presented twice with typical symptoms of acute coronary syndrome and cardiac enzyme elevation. She underwent  cardiac catheterization x2, demonstrating no significant CAD. Her echocardiogram studies  showed no significant LV dysfunction. She had a cardiac MRI study to evaluate for findings of myocarditis and this showed punctate hyperenhancement in the mid epicardium, possibly consistent with focal myocarditis. The patient did not tolerate nonsteroidal anti-inflammatories. She was treated with colchicine. ? ?More recently she received the first 2 doses of the Pfizer Covid 19 vaccine. After the second vaccine 6 weeks ago she developed chest pain about 2 days later. It waxes and wanes but has consistently been there since then. She states it ramps up and may be associated with nausea. No dyspnea. States it reminds her of when she had myocarditis. No fever or chills. No cough. ? ? ? ?Past Medical History:  ?Diagnosis Date  ? Allergic rhinitis   ? Anxiety   ? Depression   ? Diverticulosis of colon (without mention of hemorrhage)   ? Elevated LFTs   ? GERD (gastroesophageal reflux disease)   ? Hemorrhoids   ? Hiatal hernia   ? Hx of echocardiogram   ? a. Echo 3/14:  Mild LVH, EF 55-65%, normal wall motion, PASP 33  ? Hypothyroidism   ? IBS (irritable bowel syndrome)   ? Myocarditis (HCC)   ? a. NSTEMI:  12/2011 with peak troponin 2. Cath - mild nonobstructive plaque.;  b. nstemi 01/2012, relook cath - nonobs - MRI sugg of myocarditis  ? Obesity   ? POLYP, ANAL AND RECTAL 09/07/2007  ? Qualifier: Diagnosis of  By: Lawernce Ion, CMA  (AAMA), Bethann Berkshire   ? Pulmonary HTN (HCC)   ? Enlarged pulmonary artery by CT angio 12/2011 - PAP by echo 12/2011  ? Shingles 07/09/2012  ? Transaminitis   ? Abd [place] 12/2011 - hepatic steatosis  ? ? ?Past Surgical History:  ?Procedure Laterality Date  ? CHOLECYSTECTOMY    ? LEFT AND RIGHT HEART CATHETERIZATION WITH CORONARY ANGIOGRAM N/A 01/16/2012  ? Procedure: LEFT AND RIGHT HEART CATHETERIZATION WITH CORONARY ANGIOGRAM;  Surgeon: Shelby Stade, MD;  Location: [facility] CATH LAB;  Service: Cardiovascular;  Laterality: N/A;  ? LEFT HEART CATHETERIZATION WITH CORONARY ANGIOGRAM N/A 12/15/2011  ? Procedure: LEFT HEART CATHETERIZATION WITH CORONARY ANGIOGRAM;  Surgeon: Shelby Ballinger M Swazil[place], MD;  Location: [facility] CATH LAB;  Service: Cardiovascular;  Laterality: N/A;  ? TONSILLECTOMY AND ADENOIDECTOMY    ? ? ? ?Current Outpatient Medications  ?Medication Sig Dispense Refill  ? acetaminophen (TYLENOL) 650 MG CR tablet Take 1,300 mg by mouth every 8 (eight) hours.    ? allopurinol (ZYLOPRIM) 100 MG tablet Take 1 tablet (100 mg total) by mouth 2 (two) times daily. 60 tablet 5  ? Esomeprazole Magnesium (NEXIUM PO) Take by mouth.    ? FLUoxetine (PROZAC) 10 MG capsule Take 1 capsule by mouth once daily 90 capsule 0  ? levothyroxine (SYNTHROID) 100 MCG tablet Take 1 tablet (100 mcg total) by mouth daily. 90 tablet 0  ?  lisinopril-hydrochlorothiazide (ZESTORETIC) 10-12.5 MG tablet Take 1 tablet by mouth once daily 90 tablet 0  ? Loratadine 10 MG CAPS Take 10 mg by mouth daily as needed. As needed for sinus    ? ?No current facility-administered medications for this visit.  ? ? ?Allergies:   Patient has no known allergies.  ? ? ?Social History:  The patient  reports that she quit smoking about 15 years ago. Her smoking use included cigarettes. She has never used smokeless tobacco. She reports current alcohol use. She reports that she does not use drugs.  ? ?Family History:  The patient's family history includes Arthritis in her mother; Breast  cancer in her mother; Coronary artery disease in her mother; Diabetes in her brother; Other in her mother; Thyroid disease in her mother.  ? ? ?ROS:  Please see the history of present illness.   Otherwise, review of systems are positive for none.   All other systems are reviewed and negative.  ? ? ?PHYSICAL EXAM: ?VS:  BP 116/72   Pulse 90   Ht 5\' 4"  (1.626 m)   Wt 250 lb 3.2 oz (113.5 kg)   SpO2 96%   BMI 42.95 kg/m?  , BMI Body mass index is 42.95 kg/m?. ?GEN: Well nourished, obese,  in no acute distress ?HEENT: normal ?Neck: no JVD, carotid bruits, or masses ?Cardiac: RRR; no murmurs, rubs, or gallops,no edema  ?Respiratory:  clear to auscultation bilaterally, normal work of breathing ?GI: soft, nontender, nondistended, + BS ?MS: no deformity or atrophy ?Skin: warm and dry, no rash ?Neuro:  Strength and sensation are intact ?Psych: euthymic mood, full affect ? ? ?EKG:  EKG is ordered today. ?The ekg ordered today demonstrates NSR rate 90. Normal.. I have personally reviewed and interpreted this study. ? ? ? ?Recent Labs: ?11/12/2021: ALT 11; BUN 28; Creatinine, Ser 1.19; Hemoglobin 12.7; Platelets 232.0; Potassium 4.0; Sodium 139; TSH 3.06  ? ? ?Lipid Panel ?   ?Component Value Date/Time  ? CHOL 187 11/12/2021 1054  ? TRIG 118.0 11/12/2021 1054  ? HDL 43.10 11/12/2021 1054  ? CHOLHDL 4 11/12/2021 1054  ? VLDL 23.6 11/12/2021 1054  ? LDLCALC 121 (H) 11/12/2021 1054  ? ?  ? ?Wt Readings from Last 3 Encounters:  ?02/27/22 250 lb 3.2 oz (113.5 kg)  ?11/12/21 250 lb (113.4 kg)  ?06/11/21 250 lb (113.4 kg)  ?  ? ? ?Other studies Reviewed: ?Additional studies/ records that were reviewed today include:  ? ?Cardiac cath 01/16/12:  ?Catheterization ?  ?Indication: Recurrent Chest Pain ?  ?Procedure: After informed consent and clinical "time out" the right groin was prepped and draped in a sterile fashion.  A 5Fr sheath was placed in the right femoral artery using seldinger technique and local lidocaine.  Standard JL4, JR4  and angled pigtail catheters were used to engage the coronary arteries.  Coronary arteries were visualized in orthogonal views using caudal and cranial angulation.  RAO ventriculography was done using 24 * cc of contrast.   ?  ?Medications:  ?            Versed: 2 mg's ?            Fentanyl: 0 ug's ?  ?Coronary Arteries: ?Right dominant with no anomalies ?  ?LM: normal ? ?LAD:  normal ?             ?            D1: small and normal ?  D2: large and normal ?  ?Circumflex:  Normal ?  ?            OM1:  One large OM that is normal ?  ?RCA:  Dominant and normal ?  ?            PDA: normal ?            PLA: normal ?  ?Ventriculography: EF: 60 %, no RWMA;s ?  ?Hemodynamics: and Right Heart Cath.  Right heat cath done to R/O pulmonary hypertension as source of chest pain ?  ?            Aortic Pressure: 172/92  mmHg ?            LV Pressure: 172/10  MmHg ?            RA: Mean 4 mmHg ?            RV: 30/1 mmHg ?            PA: 28/9 mmHg ?            PCWP:  Mean 5 mmHg ?            CO:  7.44 L/min  CI: 3.54 L/min/m 2 ?  ?  ?Impression:  Normal right and left heart cath.  Etiology of patients recurrent chest pain with positive enzymes still not clearly explained.  ?Tolerated procedure well ?  ?Charlton Haws ?01/16/2012 ?2:26 PM ?   ?  ? ? ?Cardiac MRI 01/14/12: MRN: [id number]  ? ?01/14/12  ? ?Protocol:  The patient was scanned on a 1.5 Tesla GE magnet.  A  ?dedicated cardiac coil  was used.  Functional imaging was done  ?using Fiesta sequences. 2,3 and 4 chamber views were done to assess  ?for RWMA;s  Quantitative LV EF was calculated using Circle software  ?on a short axis stack.  Simpsons calculation was applied. The  ?patient received 28 cc of multihance.  After 10 minutes inversion  ?recovery sequences were done to assess for infiltration and scar.  ? ?Findings:  All 4 cardiac chambers were normal in size and function.  ?There was no ASD, VSD or pericardial effusion.  Mitral, tricuspid,  ?and PV;s appeared  normal.  The AV was trileaflet and normal.  The  ?right main PA was dilated at 28.50mm.  The ascending aortic root was  ?normal measuring 81mm.  ? ?LV size was normal.  There are no discrete RWMA;s.  The  ?qua

## 2022-02-27 ENCOUNTER — Ambulatory Visit: Payer: PPO | Admitting: Cardiology

## 2022-02-27 ENCOUNTER — Encounter: Payer: Self-pay | Admitting: Cardiology

## 2022-02-27 VITALS — BP 116/72 | HR 90 | Ht 64.0 in | Wt 250.2 lb

## 2022-02-27 DIAGNOSIS — Z8679 Personal history of other diseases of the circulatory system: Secondary | ICD-10-CM | POA: Diagnosis not present

## 2022-02-27 DIAGNOSIS — R072 Precordial pain: Secondary | ICD-10-CM | POA: Diagnosis not present

## 2022-02-27 NOTE — Patient Instructions (Signed)
Medication Instructions:  ?Continue same medications ? ? ?Lab Work: ?Troponin,sed rate,crp today ? ? ?Testing/Procedures: ?None ordered ? ? ?Follow-Up: ?At [facility], you and your health needs are our priority.  As part of our continuing mission to provide you with exceptional heart care, we have created designated Provider Care Teams.  These Care Teams include your primary Cardiologist (physician) and Advanced Practice Providers (APPs -  Physician Assistants and Nurse Practitioners) who all work together to provide you with the care you need, when you need it. ? ?We recommend signing up for the patient portal called "MyChart".  Sign up information is provided on this After Visit Summary.  MyChart is used to connect with patients for Virtual Visits (Telemedicine).  Patients are able to view lab/test results, encounter notes, upcoming appointments, etc.  Non-urgent messages can be sent to your provider as well.   ?To learn more about what you can do with MyChart, go to [email or web address].   ? ?Your next appointment:  To Be Determined After Labs ?  ? ?The format for your next appointment: Office   ? ? ?Provider:  Dr.Jordan ?{ ? ? ?Important Information About Sugar ? ? ? ? ? ? ?

## 2022-02-28 ENCOUNTER — Other Ambulatory Visit: Payer: Self-pay

## 2022-02-28 ENCOUNTER — Other Ambulatory Visit: Payer: Self-pay | Admitting: Cardiology

## 2022-02-28 DIAGNOSIS — R072 Precordial pain: Secondary | ICD-10-CM

## 2022-02-28 LAB — SEDIMENTATION RATE: Sed Rate: 41 mm/hr — ABNORMAL HIGH (ref 0–40)

## 2022-02-28 LAB — TROPONIN T: Troponin T (Highly Sensitive): 7 ng/L (ref 0–14)

## 2022-02-28 LAB — C-REACTIVE PROTEIN: CRP: 22 mg/L — ABNORMAL HIGH (ref 0–10)

## 2022-02-28 MED ORDER — COLCHICINE 0.6 MG PO CAPS: ORAL_CAPSULE | ORAL | 0 refills | Status: DC

## 2022-03-10 ENCOUNTER — Ambulatory Visit (INDEPENDENT_AMBULATORY_CARE_PROVIDER_SITE_OTHER): Payer: PPO

## 2022-03-10 DIAGNOSIS — R072 Precordial pain: Secondary | ICD-10-CM | POA: Diagnosis not present

## 2022-03-10 LAB — ECHOCARDIOGRAM COMPLETE
AR max vel: 2.28 cm2
AV Area VTI: 2.46 cm2
AV Area mean vel: 2.46 cm2
AV Mean grad: 3.5 mmHg
AV Peak grad: 6.3 mmHg
AV Vena cont: 0.27 cm
Ao pk vel: 1.26 m/s
Area-P 1/2: 3.23 cm2
Calc EF: 64.3 %
S' Lateral: 2.84 cm
Single Plane A2C EF: 61.2 %
Single Plane A4C EF: 67.4 %

## 2022-03-11 ENCOUNTER — Telehealth: Payer: Self-pay

## 2022-03-11 NOTE — Telephone Encounter (Signed)
Spoke to patient echo results given.Stated she is concerned she is having same symptoms she had 10 years ago when she had myocarditis.Stated symptoms did not start until she had 2nd covid vaccine on 01/11/22.Stated she feels like she might have pericarditis.She will continue to take Colchicine and keep her appointment with Dr.Jordan 5/26 at 2:00 pm.Dr.Jordan is out of office this week.I will make him aware. ?

## 2022-03-12 ENCOUNTER — Other Ambulatory Visit: Payer: Self-pay | Admitting: Internal Medicine

## 2022-03-28 NOTE — Progress Notes (Signed)
Cardiology Office Note   Date:  04/04/2022   ID:  Shelby Wong, DOB December 22, 1950, MRN [id number]  PCP:  Madelin Headings, MD  Cardiologist:   Johnedward Brodrick [place], MD   Chief Complaint  Patient presents with   Follow-up      History of Present Illness: Shelby Wong is a 71 y.o. female who is seen at the request of Dr Fabian Sharp for evaluation with history of myocarditis. She was seen remotely by Dr Excell Seltzer. Last seen in 2013. She presented twice with typical symptoms of acute coronary syndrome and cardiac enzyme elevation. She underwent  cardiac catheterization x2, demonstrating no significant CAD. Her echocardiogram studies  showed no significant LV dysfunction. She had a cardiac MRI study to evaluate for findings of myocarditis and this showed punctate hyperenhancement in the mid epicardium, possibly consistent with focal myocarditis. The patient did not tolerate nonsteroidal anti-inflammatories. She was treated with colchicine.  More recently she received the first 2 doses of the Pfizer Covid 19 vaccine. After the second vaccine 6 weeks ago she developed chest pain about 2 days later. It waxes and wanes but has consistently been there since then. She states it ramps up and may be associated with nausea. No dyspnea. States it reminds her of when she had myocarditis. No fever or chills. No cough.  On her prior evaluation her troponin was normal. Inflammatory markers of sed rate and CRP were elevated but not as high as 2013. Echo was obtained and showed normal LV function and no pericardial effusion. She was given a trial of colchicine. On follow up she is feeling much better. Still notes a little something in her chest but no pain. No SOB.     Past Medical History:  Diagnosis Date   Allergic rhinitis    Anxiety    Depression    Diverticulosis of colon (without mention of hemorrhage)    Elevated LFTs    GERD (gastroesophageal reflux disease)    Hemorrhoids    Hiatal hernia    Hx of echocardiogram     a. Echo 3/14:  Mild LVH, EF 55-65%, normal wall motion, PASP 33   Hypothyroidism    IBS (irritable bowel syndrome)    Myocarditis (HCC)    a. NSTEMI:  12/2011 with peak troponin 2. Cath - mild nonobstructive plaque.;  b. nstemi 01/2012, relook cath - nonobs - MRI sugg of myocarditis   Obesity    POLYP, ANAL AND RECTAL 09/07/2007   Qualifier: Diagnosis of  By: Lawernce Ion, CMA (AAMA), Bethann Berkshire    Pulmonary HTN (HCC)    Enlarged pulmonary artery by CT angio 12/2011 - PAP by echo 12/2011   Shingles 07/09/2012   Transaminitis    Abd [place] 12/2011 - hepatic steatosis    Past Surgical History:  Procedure Laterality Date   CHOLECYSTECTOMY     LEFT AND RIGHT HEART CATHETERIZATION WITH CORONARY ANGIOGRAM N/A 01/16/2012   Procedure: LEFT AND RIGHT HEART CATHETERIZATION WITH CORONARY ANGIOGRAM;  Surgeon: Wendall Stade, MD;  Location: [facility] CATH LAB;  Service: Cardiovascular;  Laterality: N/A;   LEFT HEART CATHETERIZATION WITH CORONARY ANGIOGRAM N/A 12/15/2011   Procedure: LEFT HEART CATHETERIZATION WITH CORONARY ANGIOGRAM;  Surgeon: Josalynn Johndrow M [place], MD;  Location: [facility] CATH LAB;  Service: Cardiovascular;  Laterality: N/A;   TONSILLECTOMY AND ADENOIDECTOMY       Current Outpatient Medications  Medication Sig Dispense Refill   acetaminophen (TYLENOL) 650 MG CR tablet Take 1,300 mg by mouth every 8 (eight) hours.  allopurinol (ZYLOPRIM) 100 MG tablet Take 1 tablet (100 mg total) by mouth 2 (two) times daily. 60 tablet 5   Colchicine 0.6 MG CAPS Take 0.6 mg daily 30 capsule 0   Esomeprazole Magnesium (NEXIUM PO) Take by mouth.     FLUoxetine (PROZAC) 10 MG capsule Take 1 capsule by mouth once daily 90 capsule 0   levothyroxine (SYNTHROID) 100 MCG tablet Take 1 tablet by mouth once daily 90 tablet 0   lisinopril-hydrochlorothiazide (ZESTORETIC) 10-12.5 MG tablet Take 1 tablet by mouth once daily 90 tablet 0   Loratadine 10 MG CAPS Take 10 mg by mouth daily as needed. As needed for sinus     No current  facility-administered medications for this visit.    Allergies:   Patient has no known allergies.    Social History:  The patient  reports that she quit smoking about 15 years ago. Her smoking use included cigarettes. She has never used smokeless tobacco. She reports current alcohol use. She reports that she does not use drugs.   Family History:  The patient's family history includes Arthritis in her mother; Breast cancer in her mother; Coronary artery disease in her mother; Diabetes in her brother; Other in her mother; Thyroid disease in her mother.    ROS:  Please see the history of present illness.   Otherwise, review of systems are positive for none.   All other systems are reviewed and negative.    PHYSICAL EXAM: VS:  BP 108/62 (BP Location: Left Wrist, Patient Position: Sitting, Cuff Size: Normal)   Pulse 85   Ht 5\' 4"  (1.626 m)   Wt 249 lb (112.9 kg)   BMI 42.74 kg/m  , BMI Body mass index is 42.74 kg/m. GEN: Well nourished, obese,  in no acute distress HEENT: normal Neck: no JVD, carotid bruits, or masses Cardiac: RRR; no murmurs, rubs, or gallops,no edema  Respiratory:  clear to auscultation bilaterally, normal work of breathing GI: soft, nontender, nondistended, + BS MS: no deformity or atrophy Skin: warm and dry, no rash Neuro:  Strength and sensation are intact Psych: euthymic mood, full affect   EKG:  EKG is not ordered today.    Recent Labs: 11/12/2021: ALT 11; BUN 28; Creatinine, Ser 1.19; Hemoglobin 12.7; Platelets 232.0; Potassium 4.0; Sodium 139; TSH 3.06    Lipid Panel    Component Value Date/Time   CHOL 187 11/12/2021 1054   TRIG 118.0 11/12/2021 1054   HDL 43.10 11/12/2021 1054   CHOLHDL 4 11/12/2021 1054   VLDL 23.6 11/12/2021 1054   LDLCALC 121 (H) 11/12/2021 1054      Wt Readings from Last 3 Encounters:  04/04/22 249 lb (112.9 kg)  02/27/22 250 lb 3.2 oz (113.5 kg)  11/12/21 250 lb (113.4 kg)      Other studies Reviewed: Additional  studies/ records that were reviewed today include:   Cardiac cath 01/16/12:  Catheterization   Indication: Recurrent Chest Pain   Procedure: After informed consent and clinical "time out" the right groin was prepped and draped in a sterile fashion.  A 5Fr sheath was placed in the right femoral artery using seldinger technique and local lidocaine.  Standard JL4, JR4 and angled pigtail catheters were used to engage the coronary arteries.  Coronary arteries were visualized in orthogonal views using caudal and cranial angulation.  RAO ventriculography was done using 24 * cc of contrast.     Medications:  Versed: 2 mg's             Fentanyl: 0 ug's   Coronary Arteries: Right dominant with no anomalies   LM: normal  LAD:  normal                          D1: small and normal             D2: large and normal   Circumflex:  Normal               OM1:  One large OM that is normal   RCA:  Dominant and normal               PDA: normal             PLA: normal   Ventriculography: EF: 60 %, no RWMA;s   Hemodynamics: and Right Heart Cath.  Right heat cath done to R/O pulmonary hypertension as source of chest pain               Aortic Pressure: 172/92  mmHg             LV Pressure: 172/10  MmHg             RA: Mean 4 mmHg             RV: 30/1 mmHg             PA: 28/9 mmHg             PCWP:  Mean 5 mmHg             CO:  7.44 L/min  CI: 3.54 L/min/m 2     Impression:  Normal right and left heart cath.  Etiology of patients recurrent chest pain with positive enzymes still not clearly explained.  Tolerated procedure well   Charlton Hawseter Nishan 01/16/2012 2:26 PM        Cardiac MRI 01/14/12: MRN: [id number]   01/14/12   Protocol:  The patient was scanned on a 1.5 Tesla GE magnet.  A  dedicated cardiac coil  was used.  Functional imaging was done  using Fiesta sequences. 2,3 and 4 chamber views were done to assess  for RWMA;s  Quantitative LV EF was calculated using Circle  software  on a short axis stack.  Simpsons calculation was applied. The  patient received 28 cc of multihance.  After 10 minutes inversion  recovery sequences were done to assess for infiltration and scar.   Findings:  All 4 cardiac chambers were normal in size and function.  There was no ASD, VSD or pericardial effusion.  Mitral, tricuspid,  and PV;s appeared normal.  The AV was trileaflet and normal.  The  right main PA was dilated at 28.696mm.  The ascending aortic root was  normal measuring 32mm.   LV size was normal.  There are no discrete RWMA;s.  The  quantitative EF was normal at 57%.  ( EDV 124, ESV 54cc, SV 71cc)  Hyperenhancement images showed two small areas of mid epicardial  uptake in the mid septum and anterolateral walls.  Given the  punctate uptake and mid epicardial location this is more consistant  with myocarditis and not CAD.   Impression:   1) Normal EF with no distinct RWMA;s EF 57%   2) Two small areas of mid epicardial gadolinium uptake, one in the  mid septum and one in the mid anterolateral wall.  3) No pericardial effusion   4) Normal RV/RA/LA   5) Normal ascending aortic root 19mm   6) Dilatation of the RPA measuring 28.6 mm   Cc: Dr Tonny Bollman   Original Report Authenticated By: [email or web address]   Echo 01/13/13: Study Conclusions   - Left ventricle: The cavity size was normal. Wall thickness    was increased in a pattern of mild LVH. Systolic function    was normal. The estimated ejection fraction was in the    range of 55% to 65%. Wall motion was normal; there were no    regional wall motion abnormalities. Left ventricular    diastolic function parameters were normal.  - Aortic valve: Trivial regurgitation.  - Atrial septum: No defect or patent foramen ovale was    identified.  - Pulmonary arteries: PA peak pressure: 26mm Hg (S).   ------------------------------------------------------------   Echo 03/10/22: IMPRESSIONS     1. Left  ventricular ejection fraction, by estimation, is 60 to 65%. Left  ventricular ejection fraction by 3D volume is 63 %. The left ventricle has  normal function. The left ventricle has no regional wall motion  abnormalities. Left ventricular diastolic   parameters were normal.   2. Right ventricular systolic function is normal. The right ventricular  size is normal. There is mildly elevated pulmonary artery systolic  pressure. The estimated right ventricular systolic pressure is 38.5 mmHg.   3. The mitral valve is normal in structure. Trivial mitral valve  regurgitation. No evidence of mitral stenosis.   4. The aortic valve is tricuspid. Aortic valve regurgitation is trivial.  Aortic valve sclerosis is present, with no evidence of aortic valve  stenosis. Aortic valve area, by VTI measures 2.46 cm. Aortic valve mean  gradient measures 3.5 mmHg. Aortic  valve Vmax measures 1.25 m/s.   5. The inferior vena cava is normal in size with greater than 50%  respiratory variability, suggesting right atrial pressure of 3 mmHg.   Comparison(s): EF 55%, mild LVH, trivial AI, PA peak pressure .    ASSESSMENT AND PLAN:  1.  Chest pain of unclear etiology. She is concerned about recurrent myocarditis related to  Covid Vaccine. Her exam and Ecg are normal. Troponin was normal. Echo showed normal LV function. This makes myocarditis unlikely. She did have elevation of inflammatory markers but this is nonspecific. I do note that when she had myocarditis before she had significant elevation of inflammatory markers including sed rate and CRP (higher than we are seeing now). She also had  mild elevation of her troponin. I think she can complete her current colchicine prescription then discontinue. Her prognosis is excellent. Will follow up PRN.   Current medicines are reviewed at length with the patient today.  The patient does not have concerns regarding medicines.  The following changes have been made:   no change  Labs/ tests ordered today include:   No orders of the defined types were placed in this encounter.        Disposition:   FU PRN  Signed, Vega Withrow [place], MD  04/04/2022 2:34 PM    [facility] [address] [place], [place], [address] Phone [phone], Fax [phone]

## 2022-03-30 ENCOUNTER — Other Ambulatory Visit: Payer: Self-pay | Admitting: Internal Medicine

## 2022-04-04 ENCOUNTER — Encounter: Payer: Self-pay | Admitting: Cardiology

## 2022-04-04 ENCOUNTER — Ambulatory Visit: Payer: PPO | Admitting: Cardiology

## 2022-04-04 VITALS — BP 108/62 | HR 85 | Ht 64.0 in | Wt 249.0 lb

## 2022-04-04 DIAGNOSIS — R072 Precordial pain: Secondary | ICD-10-CM | POA: Diagnosis not present

## 2022-04-04 NOTE — Patient Instructions (Signed)
Medication Instructions:  Your physician recommends that you continue on your current medications as directed. Please refer to the Current Medication list given to you today.  *If you need a refill on your cardiac medications before your next appointment, please call your pharmacy*   Follow-Up: At [facility], you and your health needs are our priority.  As part of our continuing mission to provide you with exceptional heart care, we have created designated Provider Care Teams.  These Care Teams include your primary Cardiologist (physician) and Advanced Practice Providers (APPs -  Physician Assistants and Nurse Practitioners) who all work together to provide you with the care you need, when you need it.  We recommend signing up for the patient portal called "MyChart".  Sign up information is provided on this After Visit Summary.  MyChart is used to connect with patients for Virtual Visits (Telemedicine).  Patients are able to view lab/test results, encounter notes, upcoming appointments, etc.  Non-urgent messages can be sent to your provider as well.   To learn more about what you can do with MyChart, go to [email or web address].    Your next appointment:   We will see you on an as needed basis.  Provider:   Peter [place], MD

## 2022-04-13 ENCOUNTER — Other Ambulatory Visit: Payer: Self-pay | Admitting: Internal Medicine

## 2022-04-13 ENCOUNTER — Other Ambulatory Visit: Payer: Self-pay | Admitting: Cardiology

## 2022-04-14 NOTE — Telephone Encounter (Signed)
Pt states she is still in [place]

## 2022-04-14 NOTE — Telephone Encounter (Signed)
Last OV 11/12/21: Pt stated she was moving to [place].  LVM instructions for pt to return call to determine if pt has moved yet.

## 2022-05-15 ENCOUNTER — Telehealth: Payer: Self-pay

## 2022-05-15 NOTE — Telephone Encounter (Signed)
No mammogram documented. Last OV 11/12/21  LVM for pt to return call to schedule mammogram. Please transfer to [facility].

## 2022-05-26 ENCOUNTER — Other Ambulatory Visit: Payer: Self-pay | Admitting: Internal Medicine

## 2022-05-27 NOTE — Telephone Encounter (Signed)
Last Ov 11/12/21 Filled 11/19/21 Is it ok to refill?

## 2022-06-06 ENCOUNTER — Other Ambulatory Visit: Payer: Self-pay | Admitting: Internal Medicine

## 2022-06-21 ENCOUNTER — Other Ambulatory Visit: Payer: Self-pay | Admitting: Internal Medicine

## 2022-07-06 ENCOUNTER — Other Ambulatory Visit: Payer: Self-pay | Admitting: Internal Medicine

## 2022-09-03 ENCOUNTER — Other Ambulatory Visit: Payer: Self-pay | Admitting: Internal Medicine

## 2022-09-18 ENCOUNTER — Other Ambulatory Visit: Payer: Self-pay | Admitting: Internal Medicine

## 2022-10-09 ENCOUNTER — Other Ambulatory Visit: Payer: Self-pay | Admitting: Internal Medicine

## 2022-12-01 ENCOUNTER — Other Ambulatory Visit: Payer: Self-pay | Admitting: Internal Medicine

## 2022-12-11 ENCOUNTER — Other Ambulatory Visit: Payer: Self-pay | Admitting: Internal Medicine

## 2022-12-29 ENCOUNTER — Other Ambulatory Visit: Payer: Self-pay | Admitting: Internal Medicine

## 2023-01-04 ENCOUNTER — Other Ambulatory Visit: Payer: Self-pay | Admitting: Internal Medicine

## 2023-01-07 ENCOUNTER — Other Ambulatory Visit: Payer: Self-pay | Admitting: Family

## 2023-01-14 ENCOUNTER — Telehealth: Payer: Self-pay | Admitting: Internal Medicine

## 2023-01-14 NOTE — Telephone Encounter (Signed)
Pt has an appt on 02-04-2023 and would like a refill on allopurinol (ZYLOPRIM) 100 MG tablet  [place], [place] Phone: [phone]  Fax: [phone]

## 2023-01-16 ENCOUNTER — Other Ambulatory Visit: Payer: Self-pay

## 2023-01-16 MED ORDER — ALLOPURINOL 100 MG PO TABS: 100.0000 mg | ORAL_TABLET | Freq: Two times a day (BID) | ORAL | 0 refills | Status: DC

## 2023-01-16 NOTE — Telephone Encounter (Signed)
Called patient refill for Allopurinol '100mg'$  sent to [facility].   Called pt lvm requested prescription was sent.

## 2023-01-27 ENCOUNTER — Other Ambulatory Visit: Payer: Self-pay | Admitting: Internal Medicine

## 2023-02-04 ENCOUNTER — Ambulatory Visit (INDEPENDENT_AMBULATORY_CARE_PROVIDER_SITE_OTHER): Payer: PPO | Admitting: Internal Medicine

## 2023-02-04 ENCOUNTER — Encounter: Payer: Self-pay | Admitting: Internal Medicine

## 2023-02-04 VITALS — BP 118/60 | HR 91 | Temp 97.7°F | Ht 64.0 in | Wt 249.0 lb

## 2023-02-04 DIAGNOSIS — E785 Hyperlipidemia, unspecified: Secondary | ICD-10-CM | POA: Diagnosis not present

## 2023-02-04 DIAGNOSIS — Z1211 Encounter for screening for malignant neoplasm of colon: Secondary | ICD-10-CM | POA: Diagnosis not present

## 2023-02-04 DIAGNOSIS — I10 Essential (primary) hypertension: Secondary | ICD-10-CM | POA: Diagnosis not present

## 2023-02-04 DIAGNOSIS — R739 Hyperglycemia, unspecified: Secondary | ICD-10-CM

## 2023-02-04 DIAGNOSIS — E039 Hypothyroidism, unspecified: Secondary | ICD-10-CM

## 2023-02-04 DIAGNOSIS — Z79899 Other long term (current) drug therapy: Secondary | ICD-10-CM

## 2023-02-04 DIAGNOSIS — E790 Hyperuricemia without signs of inflammatory arthritis and tophaceous disease: Secondary | ICD-10-CM

## 2023-02-04 LAB — LIPID PANEL
Cholesterol: 198 mg/dL (ref 0–200)
HDL: 47.7 mg/dL (ref >39.00)
LDL Cholesterol: 118 mg/dL — ABNORMAL HIGH (ref 0–99)
NonHDL: 150.57
Total CHOL/HDL Ratio: 4
Triglycerides: 163 mg/dL — ABNORMAL HIGH (ref 0.0–149.0)
VLDL: 32.6 mg/dL (ref 0.0–40.0)

## 2023-02-04 LAB — CBC WITH DIFFERENTIAL/PLATELET
Basophils Absolute: 0 10*3/uL (ref 0.0–0.1)
Basophils Relative: 0.7 % (ref 0.0–3.0)
Eosinophils Absolute: 0.2 10*3/uL (ref 0.0–0.7)
Eosinophils Relative: 3.1 % (ref 0.0–5.0)
HCT: 37.9 % (ref 36.0–46.0)
Hemoglobin: 12.3 g/dL (ref 12.0–15.0)
Lymphocytes Relative: 26.5 % (ref 12.0–46.0)
Lymphs Abs: 1.8 10*3/uL (ref 0.7–4.0)
MCHC: 32.4 g/dL (ref 30.0–36.0)
MCV: 93.8 fl (ref 78.0–100.0)
Monocytes Absolute: 0.4 10*3/uL (ref 0.1–1.0)
Monocytes Relative: 6.6 % (ref 3.0–12.0)
Neutro Abs: 4.3 10*3/uL (ref 1.4–7.7)
Neutrophils Relative %: 63.1 % (ref 43.0–77.0)
Platelets: 265 10*3/uL (ref 150.0–400.0)
RBC: 4.04 Mil/uL (ref 3.87–5.11)
RDW: 15 % (ref 11.5–15.5)
WBC: 6.8 10*3/uL (ref 4.0–10.5)

## 2023-02-04 LAB — URIC ACID: Uric Acid, Serum: 5.7 mg/dL (ref 2.4–7.0)

## 2023-02-04 LAB — HEPATIC FUNCTION PANEL
ALT: 8 U/L (ref 0–35)
AST: 19 U/L (ref 0–37)
Albumin: 4.2 g/dL (ref 3.5–5.2)
Alkaline Phosphatase: 76 U/L (ref 39–117)
Bilirubin, Direct: 0.2 mg/dL (ref 0.0–0.3)
Total Bilirubin: 0.8 mg/dL (ref 0.2–1.2)
Total Protein: 7.4 g/dL (ref 6.0–8.3)

## 2023-02-04 LAB — TSH: TSH: 2.3 u[IU]/mL (ref 0.35–5.50)

## 2023-02-04 LAB — BASIC METABOLIC PANEL
BUN: 21 mg/dL (ref 6–23)
CO2: 30 mEq/L (ref 19–32)
Calcium: 9.8 mg/dL (ref 8.4–10.5)
Chloride: 100 mEq/L (ref 96–112)
Creatinine, Ser: 1.17 mg/dL (ref 0.40–1.20)
GFR: 47.53 mL/min — ABNORMAL LOW (ref >60.00)
Glucose, Bld: 127 mg/dL — ABNORMAL HIGH (ref 70–99)
Potassium: 3.8 mEq/L (ref 3.5–5.1)
Sodium: 140 mEq/L (ref 135–145)

## 2023-02-04 LAB — HEMOGLOBIN A1C: Hgb A1c MFr Bld: 6.6 % — ABNORMAL HIGH (ref 4.6–6.5)

## 2023-02-04 MED ORDER — ALLOPURINOL 100 MG PO TABS: 100.0000 mg | ORAL_TABLET | Freq: Two times a day (BID) | ORAL | 1 refills | Status: DC

## 2023-02-04 MED ORDER — LEVOTHYROXINE SODIUM 100 MCG PO TABS: 100.0000 ug | ORAL_TABLET | Freq: Every day | ORAL | 1 refills | Status: DC

## 2023-02-04 MED ORDER — FLUOXETINE HCL 10 MG PO CAPS: 10.0000 mg | ORAL_CAPSULE | Freq: Every day | ORAL | 1 refills | Status: DC

## 2023-02-04 MED ORDER — LISINOPRIL-HYDROCHLOROTHIAZIDE 10-12.5 MG PO TABS: 1.0000 | ORAL_TABLET | Freq: Every day | ORAL | 3 refills | Status: DC

## 2023-02-04 NOTE — Patient Instructions (Signed)
Good to  see you today  Update labs  Refill meds today . Sleep hygiene attention.   Melatonin is ok take hours before bed and turn off  screens

## 2023-02-04 NOTE — Progress Notes (Signed)
Chief Complaint  Patient presents with   Medical Management of Chronic Issues    HPI: Shelby Wong 72 y.o. come in for Chronic disease management  medication  fu Was to move to [place] this got delayed but back on . Needs meds   BP: taking Lisinhctz no concerns  in range  Thyroid  daily rare missing  replacement 1000 mcg Allopurinol taking daily  no gout attacks   not on colchicine  not needed  Weight  still up. No to bacco  rare etoh Need med refills   ROS: See pertinent positives and negatives per HPI. No cp sob  acute changes  sleep always off lat onset and sleep in ? Can she take melatonin?  No osa dx reported   Past Medical History:  Diagnosis Date   Allergic rhinitis    Anxiety    Depression    Diverticulosis of colon (without mention of hemorrhage)    Elevated LFTs    GERD (gastroesophageal reflux disease)    Hemorrhoids    Hiatal hernia    Hx of echocardiogram    a. Echo 3/14:  Mild LVH, EF 55-65%, normal wall motion, PASP 33   Hypothyroidism    IBS (irritable bowel syndrome)    Myocarditis ([place])    a. NSTEMI:  12/2011 with peak troponin 2. Cath - mild nonobstructive plaque.;  b. nstemi 01/2012, relook cath - nonobs - MRI sugg of myocarditis   Obesity    POLYP, ANAL AND RECTAL 09/07/2007   Qualifier: Diagnosis of  By: Hulan Saas, CMA (AAMA), [place] Skye    Pulmonary HTN ([place])    Enlarged pulmonary artery by CT angio 12/2011 - PAP 19mmHg by echo 12/2011   Shingles 07/09/2012   Transaminitis    Abd [place] 12/2011 - hepatic steatosis    Family History  Problem Relation Age of Onset   Arthritis Mother        osteo   Breast cancer Mother    Coronary artery disease Mother    Other Mother        hearing loss   Diabetes Brother        77 s   Thyroid disease Mother    Colon cancer Neg Hx     Social History   Socioeconomic History   Marital status: Divorced    Spouse name: Not on file   Number of children: 1   Years of education: Not on file   Highest education level:  Some college, no degree  Occupational History   Occupation: Insurance underwriter Rep  Tobacco Use   Smoking status: Former    Types: Cigarettes    Quit date: 09/11/2006    Years since quitting: 16.4   Smokeless tobacco: Never  Substance and Sexual Activity   Alcohol use: Yes    Comment: occassional   Drug use: No   Sexual activity: Not Currently    Birth control/protection: Post-menopausal  Other Topics Concern   Not on file  Social History Narrative   Occupation: Insurance underwriter Rep  [employer]   40 hours per week.laid off 4 15 no insurance  Moved in with family sister    [place] of 1   Divorced   Regular exercise- no   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   G1P1   0 Caffeine drinks daily    Lives with brother  [facility]   Social Determinants of Health   Financial Resource Strain: Patient Declined (01/31/2023)   Overall  Financial Resource Strain (CARDIA)    Difficulty of Paying Living Expenses: Patient declined  Food Insecurity: No Food Insecurity (01/31/2023)   Hunger Vital Sign    Worried About Running Out of Food in the Last Year: Never true    Ran Out of Food in the Last Year: Never true  Transportation Needs: No Transportation Needs (01/31/2023)   PRAPARE - Hydrologist (Medical): No    Lack of Transportation (Non-Medical): No  Physical Activity: Unknown (01/31/2023)   Exercise Vital Sign    Days of Exercise per Week: Patient declined    Minutes of Exercise per Session: Not on file  Stress: Patient Declined (01/31/2023)   [place]    Feeling of Stress : Patient declined  Social Connections: Unknown (01/31/2023)   Social Connection and Isolation Panel [NHANES]    Frequency of Communication with Friends and Family: Patient declined    Frequency of Social Gatherings with Friends and Family: Patient declined    Attends Religious Services: Patient declined    Marine scientist or  Organizations: No    Attends Music therapist: Not on file    Marital Status: Divorced    Outpatient Medications Prior to Visit  Medication Sig Dispense Refill   acetaminophen (TYLENOL) 650 MG CR tablet Take 1,300 mg by mouth every 8 (eight) hours.     Esomeprazole Magnesium (NEXIUM PO) Take by mouth.     Loratadine 10 MG CAPS Take 10 mg by mouth daily as needed. As needed for sinus     allopurinol (ZYLOPRIM) 100 MG tablet Take 1 tablet (100 mg total) by mouth 2 (two) times daily. 60 tablet 0   FLUoxetine (PROZAC) 10 MG capsule Take 1 capsule by mouth once daily 90 capsule 0   levothyroxine (SYNTHROID) 100 MCG tablet Take 1 tablet by mouth once daily 90 tablet 0   lisinopril-hydrochlorothiazide (ZESTORETIC) 10-12.5 MG tablet Take 1 tablet by mouth once daily 30 tablet 0   colchicine 0.6 MG tablet Take 1 tablet by mouth once daily (Patient not taking: Reported on 02/04/2023) 30 tablet 0   No facility-administered medications prior to visit.     EXAM:  BP 118/60 (BP Location: Left Arm, Patient Position: Sitting, Cuff Size: Large)   Pulse 91   Temp 97.7 F (36.5 C) (Oral)   Ht 5\' 4"  (1.626 m)   Wt 249 lb (112.9 kg)   SpO2 98%   BMI 42.74 kg/m   Body mass index is 42.74 kg/m.  GENERAL: vitals reviewed and listed above, alert, oriented, appears well hydrated and in no acute distress HEENT: atraumatic, conjunctiva  clear, no obvious abnormalities on inspection of external nose and ears NECK: no obvious masses on inspection palpation  LUNGS: clear to auscultation bilaterally, no wheezes, rales or rhonchi, good air movement CV: HRRR, no clubbing cyanosis or  peripheral edema nl cap refill  MS: moves all extremities without noticeable focal  abnormality PSYCH: pleasant and cooperative, no obvious depression or anxiety Lab Results  Component Value Date   WBC 6.8 02/04/2023   HGB 12.3 02/04/2023   HCT 37.9 02/04/2023   PLT 265.0 02/04/2023   GLUCOSE 127 (H) 02/04/2023    CHOL 198 02/04/2023   TRIG 163.0 (H) 02/04/2023   HDL 47.70 02/04/2023   LDLCALC 118 (H) 02/04/2023   ALT 8 02/04/2023   AST 19 02/04/2023   NA 140 02/04/2023   K 3.8 02/04/2023  CL 100 02/04/2023   CREATININE 1.17 02/04/2023   BUN 21 02/04/2023   CO2 30 02/04/2023   TSH 2.30 02/04/2023   INR 1.16 01/16/2012   HGBA1C 6.6 (H) 02/04/2023   BP Readings from Last 3 Encounters:  02/04/23 118/60  04/04/22 108/62  02/27/22 116/72   Lab plan  update ASSESSMENT AND PLAN:  Discussed the following assessment and plan:  Medication management - Plan: CBC with Differential/Platelet, Hemoglobin A1c, Hepatic function panel, Lipid panel, TSH, Basic metabolic panel, Uric Acid  Essential hypertension, benign - Plan: CBC with Differential/Platelet, Hemoglobin A1c, Hepatic function panel, Lipid panel, TSH, Basic metabolic panel, Uric Acid  Hyperlipidemia, unspecified hyperlipidemia type - Plan: CBC with Differential/Platelet, Hemoglobin A1c, Hepatic function panel, Lipid panel, TSH, Basic metabolic panel, Uric Acid  Hyperglycemia - Plan: CBC with Differential/Platelet, Hemoglobin A1c, Hepatic function panel, Lipid panel, TSH, Basic metabolic panel, Uric Acid  Elevated uric acid in blood  hx of gout - current suppression on allopurinol - Plan: CBC with Differential/Platelet, Hemoglobin A1c, Hepatic function panel, Lipid panel, TSH, Basic metabolic panel, Uric Acid  Hypothyroidism, unspecified type - Plan: CBC with Differential/Platelet, Hemoglobin A1c, Hepatic function panel, Lipid panel, TSH, Basic metabolic panel, Uric Acid  Morbid obesity (HCC) - Plan: CBC with Differential/Platelet, Hemoglobin A1c, Hepatic function panel, Lipid panel, TSH, Basic metabolic panel, Uric Acid  Colon cancer screening - Plan: Cologuard Reviewed  meds   conditions  control  Losing healthy weight loss will be helpful  Sleep phase  and sleep hygiene disc  fu as discussed  For now stay on allopurinol since  controlled and planning on changing medical home.  -Patient advised to return or notify health care team  if  new concerns arise.  Patient Instructions  Good to  see you today  Update labs  Refill meds today . Sleep hygiene attention.   Melatonin is ok take hours before bed and turn off  screens    Mariann Laster K. Marvie Calender M.D.

## 2023-02-09 NOTE — Progress Notes (Signed)
A1c 6.6 in early borderline diabetes range. Rest of lab  stable in about the same range   slightly decrease in renal function,  Blood count thyroid uric acid in  normal . Cut out sugars sweet s work on healthy weight loss  need fu in 4-6 months if still in area . Or establish with  new medical home.

## 2023-02-10 NOTE — Progress Notes (Signed)
Attempt to reach pt. Left a voicemail to call us back  

## 2023-02-26 DIAGNOSIS — Z1211 Encounter for screening for malignant neoplasm of colon: Secondary | ICD-10-CM | POA: Diagnosis not present

## 2023-03-06 LAB — COLOGUARD: COLOGUARD: NEGATIVE

## 2023-03-08 NOTE — Progress Notes (Signed)
Neg cologuard can  repeat in 3 years

## 2023-04-14 ENCOUNTER — Other Ambulatory Visit: Payer: Self-pay | Admitting: Internal Medicine

## 2023-05-18 ENCOUNTER — Other Ambulatory Visit: Payer: Self-pay | Admitting: Internal Medicine

## 2023-06-04 ENCOUNTER — Telehealth: Payer: Self-pay

## 2023-06-04 NOTE — Telephone Encounter (Signed)
LVM for patient to call back [phone], or to call PCP office to schedule follow up apt. AS, CMA  

## 2023-06-25 ENCOUNTER — Telehealth: Payer: Self-pay

## 2023-06-25 NOTE — Telephone Encounter (Signed)
LVM for patient to call back [phone], or to call PCP office to schedule follow up apt. AS, CMA  

## 2023-09-20 ENCOUNTER — Other Ambulatory Visit: Payer: Self-pay | Admitting: Internal Medicine

## 2023-10-03 ENCOUNTER — Other Ambulatory Visit: Payer: Self-pay | Admitting: Internal Medicine

## 2023-10-13 ENCOUNTER — Ambulatory Visit (INDEPENDENT_AMBULATORY_CARE_PROVIDER_SITE_OTHER): Payer: PPO | Admitting: Family Medicine

## 2023-10-13 VITALS — BP 124/70 | HR 82 | Temp 97.7°F | Resp 16 | Ht 64.0 in | Wt 249.1 lb

## 2023-10-13 DIAGNOSIS — M26629 Arthralgia of temporomandibular joint, unspecified side: Secondary | ICD-10-CM | POA: Diagnosis not present

## 2023-10-13 DIAGNOSIS — J309 Allergic rhinitis, unspecified: Secondary | ICD-10-CM | POA: Diagnosis not present

## 2023-10-13 DIAGNOSIS — R519 Headache, unspecified: Secondary | ICD-10-CM | POA: Diagnosis not present

## 2023-10-13 MED ORDER — FLUTICASONE PROPIONATE 50 MCG/ACT NA SUSP: 1.0000 | Freq: Every day | NASAL | 0 refills | Status: DC

## 2023-10-13 MED ORDER — AMOXICILLIN-POT CLAVULANATE 875-125 MG PO TABS: 1.0000 | ORAL_TABLET | Freq: Two times a day (BID) | ORAL | 0 refills | Status: AC

## 2023-10-13 NOTE — Progress Notes (Signed)
ACUTE VISIT Chief Complaint  Patient presents with   Sinus Problem    Ongoing for a few weeks, sore jaw, drainage and pain radiating to ear and sinus cavity. Did have some dental work done along with x-ray and they didn't see anything.    HPI: Ms.Shelby Wong is a 72 y.o. female with a PMHx significant for HTN, diaphragmatic hernia, diverticulosis of colon, IBS, GERD, hypothyroidism, HLD, and depression, among others, who is here today complaining of right sided facial pain, she thinks it is caused by sinus problems as described above.   Patient complains of right-sided facial pain for about 3 weeks.  She mentions she had a dental cavity filled around the time pain started and then returned to the dentist for follow up, x-rays was done, which were negative. States that procedure was painful, she had to keep mouth open for long time while dental work was completed. She cannot pinpoint area of pain. Negative for numbness or focal weakness.  She was then prescribed a 7-day course of antibiotics by her dentist, not sure about name, it helped some swelling in her gums go down.  She describes her current facial pain as an achy pain and rates it as an 8-9/10.  She says the pain radiates all throughout the right side of her mouth and up into her ear, mandible. She also has discomfort in right nostril.  She endorses associated postnasal drainage and some pain while chewing.  Allergic rhinitis: She takes loratadine 10 mg daily for allergies.  Pertinent negatives include chills, fever, sore throat, cough, unusual wheezes, or hearing changes.  No recent URI or travel.  Review of Systems  Constitutional:  Positive for fatigue. Negative for activity change and appetite change.  HENT:  Positive for congestion, dental problem and ear pain. Negative for ear discharge, facial swelling, mouth sores and nosebleeds.   Respiratory:  Negative for shortness of breath and stridor.   Gastrointestinal:  Negative for  abdominal pain, nausea and vomiting.  Genitourinary:  Negative for decreased urine volume and hematuria.  Skin:  Negative for rash.  Neurological:  Negative for syncope and headaches.  Hematological:  Negative for adenopathy. Does not bruise/bleed easily.  See other pertinent positives and negatives in HPI.  Current Outpatient Medications on File Prior to Visit  Medication Sig Dispense Refill   acetaminophen (TYLENOL) 650 MG CR tablet Take 1,300 mg by mouth every 8 (eight) hours.     allopurinol (ZYLOPRIM) 100 MG tablet Take 1 tablet by mouth twice daily 180 tablet 2   Esomeprazole Magnesium (NEXIUM PO) Take by mouth.     FLUoxetine (PROZAC) 10 MG capsule Take 1 capsule by mouth once daily 90 capsule 0   levothyroxine (SYNTHROID) 100 MCG tablet Take 1 tablet by mouth once daily 90 tablet 0   lisinopril-hydrochlorothiazide (ZESTORETIC) 10-12.5 MG tablet Take 1 tablet by mouth daily. 90 tablet 3   Loratadine 10 MG CAPS Take 10 mg by mouth daily as needed. As needed for sinus     No current facility-administered medications on file prior to visit.   Past Medical History:  Diagnosis Date   Allergic rhinitis    Anxiety    Depression    Diverticulosis of colon (without mention of hemorrhage)    Elevated LFTs    GERD (gastroesophageal reflux disease)    Hemorrhoids    Hiatal hernia    Hx of echocardiogram    a. Echo 3/14:  Mild LVH, EF 55-65%, normal wall motion, PASP 33  Hypothyroidism    IBS (irritable bowel syndrome)    Myocarditis (HCC)    a. NSTEMI:  12/2011 with peak troponin 2. Cath - mild nonobstructive plaque.;  b. nstemi 01/2012, relook cath - nonobs - MRI sugg of myocarditis   Obesity    POLYP, ANAL AND RECTAL 09/07/2007   Qualifier: Diagnosis of  By: Lawernce Ion, CMA (AAMA), Bethann Berkshire    Pulmonary HTN (HCC)    Enlarged pulmonary artery by CT angio 12/2011 - PAP by echo 12/2011   Shingles 07/09/2012   Transaminitis    Abd [place] 12/2011 - hepatic steatosis   No Known  Allergies  Social History   Socioeconomic History   Marital status: Divorced    Spouse name: Not on file   Number of children: 1   Years of education: Not on file   Highest education level: Some college, no degree  Occupational History   Occupation: Chemical engineer Rep  Tobacco Use   Smoking status: Former    Current packs/day: 0.00    Types: Cigarettes    Quit date: 09/11/2006    Years since quitting: 17.0   Smokeless tobacco: Never  Substance and Sexual Activity   Alcohol use: Yes    Comment: occassional   Drug use: No   Sexual activity: Not Currently    Birth control/protection: Post-menopausal  Other Topics Concern   Not on file  Social History Narrative   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  Moved in with family sister    HH of 1   Divorced   Regular exercise- no   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   G1P1   0 Caffeine drinks daily    Lives with brother  [facility]   Social Determinants of Health   Financial Resource Strain: Patient Declined (10/13/2023)   Overall Financial Resource Strain (CARDIA)    Difficulty of Paying Living Expenses: Patient declined  Food Insecurity: Patient Declined (10/13/2023)   Hunger Vital Sign    Worried About Running Out of Food in the Last Year: Patient declined    Ran Out of Food in the Last Year: Patient declined  Transportation Needs: No Transportation Needs (10/13/2023)   PRAPARE - Administrator, Civil Service (Medical): No    Lack of Transportation (Non-Medical): No  Physical Activity: Unknown (10/13/2023)   Exercise Vital Sign    Days of Exercise per Week: Patient declined    Minutes of Exercise per Session: Not on file  Stress: No Stress Concern Present (10/13/2023)   [employer] of Occupational Health - Occupational Stress Questionnaire    Feeling of Stress : Only a little  Social Connections: Unknown (10/13/2023)   Social Connection and Isolation  Panel [NHANES]    Frequency of Communication with Friends and Family: Patient declined    Frequency of Social Gatherings with Friends and Family: Patient declined    Attends Religious Services: Patient declined    Database administrator or Organizations: Patient declined    Attends Banker Meetings: Not on file    Marital Status: Divorced   Vitals:   10/13/23 1423  BP: 124/70  Pulse: 82  Resp: 16  Temp: 97.7 F (36.5 C)  SpO2: 99%   Body mass index is 42.76 kg/m.  Physical Exam Vitals and nursing note reviewed.  Constitutional:      General: She is not in acute distress.  Appearance: She is well-developed.  HENT:     Head: Normocephalic and atraumatic.     Right Ear: Hearing, tympanic membrane, ear canal and external ear normal.     Left Ear: Hearing, tympanic membrane, ear canal and external ear normal.     Nose: No rhinorrhea.     Right Turbinates: Not enlarged.     Left Turbinates: Not enlarged.     Right Sinus: No maxillary sinus tenderness or frontal sinus tenderness.     Left Sinus: No maxillary sinus tenderness or frontal sinus tenderness.     Mouth/Throat:     Mouth: Mucous membranes are moist.     Pharynx: Uvula midline. Postnasal drip present.     Comments: No limitation of mouth opening, right seems to be more prominent. Tenderness upon palpation of area under right TMJ and milder along right mandible. No edema or erythema. Eyes:     Conjunctiva/sclera: Conjunctivae normal.  Cardiovascular:     Rate and Rhythm: Normal rate and regular rhythm.     Heart sounds: No murmur heard. Pulmonary:     Effort: Pulmonary effort is normal. No respiratory distress.     Breath sounds: Normal breath sounds.  Musculoskeletal:     Cervical back: No edema or erythema.  Lymphadenopathy:     Head:     Right side of head: No submandibular, preauricular or posterior auricular adenopathy.     Cervical: No cervical adenopathy.  Skin:    General: Skin is warm.      Findings: No erythema or rash.  Neurological:     General: No focal deficit present.     Mental Status: She is alert and oriented to person, place, and time.     Cranial Nerves: No cranial nerve deficit.     Gait: Gait normal.  Psychiatric:        Mood and Affect: Mood and affect normal.    ASSESSMENT AND PLAN:  Ms. Ashley Mariner was seen today for sinus issues and right sided facial pain.   Right facial pain We discussed possible etiologies, including neuropathic pain, TMJ OA, and sinusitis among some. She is concerned about possible sinus infection, explained it is not the typical presentation and examination is not very suggective of an active sinus infectious process. Sh just completed abx treatmetn after dental procedure, not sure about name of med. We discussed possible side effects of antibiotic treatment, including C. difficile infection. We reviewed options, including holding on antibiotic and arranging facial imaging. We decided trial of Augmentin for 10 days, if pain is not greatly improved in 72 hours, imaging is to be considered. Monitor for new symptoms.  Other orders -     Amoxicillin-Pot Clavulanate; Take 1 tablet by mouth 2 (two) times daily for 10 days.  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 General treatment recommendations given. Because CKD she needs to avoid NSAIDs, continue Tylenol 500 mg 3-4 times per day.  Allergic rhinitis, unspecified seasonality, unspecified trigger Continue loratadine 10 mg daily. Recommend Flonase nasal spray daily for 10 to 14 days then as needed and nasal saline irrigations as needed throughout the day.  -     Fluticasone Propionate; Place 1 spray into both nostrils daily.  Dispense: 16 g; Refill: 0   I spent a total of 34 minutes in both face to face and non face to face activities for this visit on the date of this encounter. During this time history was  obtained and documented,  examination was performed, and assessment/plan discussed.  Return in about 4 weeks (around 11/10/2023), or if symptoms worsen or fail to improve, for with PCP.  I, Rolla Etienne Wierda, acting as a scribe for Bearett Porcaro [place], MD., have documented all relevant documentation on the behalf of Dimitris Shanahan [place], MD, as directed by  Isayah Ignasiak [place], MD while in the presence of Abryana Lykens [place], MD.   I, Dmiyah Liscano [place], MD, have reviewed all documentation for this visit. The documentation on 10/13/23 for the exam, diagnosis, procedures, and orders are all accurate and complete.  Benedetta Sundstrom G. [place], MD  [facility]. [facility].

## 2023-10-13 NOTE — Patient Instructions (Addendum)
A few things to remember from today's visit:  Allergic rhinitis, unspecified seasonality, unspecified trigger - Plan: fluticasone (FLONASE) 50 MCG/ACT nasal spray  Right facial pain  TMJ syndrome  I am not certain pain is caused by sinus infection. Antibiotic to try for 10 days, it can cause diarrhea. Take a daily probiotic, Align. If not better the next step will be facial imaging.  Do not use My Chart to request refills or for acute issues that need immediate attention. If you send a my chart message, it may take a few days to be addressed, specially if I am not in the office.  Please be sure medication list is accurate. If a new problem present, please set up appointment sooner than planned today.

## 2023-10-29 ENCOUNTER — Telehealth: Payer: Self-pay | Admitting: *Deleted

## 2023-10-29 NOTE — Telephone Encounter (Signed)
Attempted to reach pt. Left a voicemail to call us back and left a detail message that message will be sent to mychart.

## 2023-10-29 NOTE — Telephone Encounter (Signed)
Copied from CRM [id number]. Topic: Clinical - Medical Advice >> Oct 29, 2023  9:17 AM Shelby Wong wrote: Reason for CRM: Patient recently finished up an antibiotic treatment provided by Dr. [place] - patient is experiencing what she describes as a yeast inflection - could the office call something in for her?  Preferred pharmacy is the [facility] in her preferences. Please call patient when you have a moment.

## 2023-10-29 NOTE — Telephone Encounter (Signed)
This message should have gone to dr [place] but will answer. What does she prefer  I usually ask to use otc miconazole vaginal or similar  3-7 days   However could also use diflucan 150 x 1  disp 1 and send to pharmacy if not allergic and no liver disease . Both have the same success rate

## 2023-10-30 NOTE — Telephone Encounter (Signed)
Attempted to reach pt. Left a voicemail to call us back.  

## 2023-11-13 ENCOUNTER — Ambulatory Visit: Payer: Self-pay | Admitting: Internal Medicine

## 2023-11-13 NOTE — Telephone Encounter (Signed)
 Copied from CRM [id number]. Topic: Clinical - Red Word Triage >> Nov 13, 2023  3:55 PM Drema MATSU wrote: Red Word that prompted transfer to Nurse Triage: Patient is experiencing cramping around stomach area, discomfort, has not been passing bowel movement lately, bowels were really hard today, and inability to expel flatus.   Chief Complaint: Possible constipation Symptoms: Off and on cramping Frequency: since Dec 31st Pertinent Negatives: Patient denies chest pain, fever, blood in stool, not being able to eat Disposition: [] ED /[x] Urgent Care (no appt availability in office) / [] Appointment(In office/virtual)/ []  [facility]/ [] Home Care/ [] Refused Recommended Disposition /[]  Mobile Bus/ []  Follow-up with PCP Additional Notes: Patient called and advised that Patient also had a hernia around her belly. Patient denies back pain, blood in urine, or pain with urination. Patient states she did vomit once and she also has a lot of sinus trouble. She believes this mostly phlegm. Patient states that she had a hard bowel movement on December 31st, passed a small bowel movement today, and is unable to pass much gas. Patient states she is also very bloated. She states that she is having very abnormal bathroom habits at this time.  Patient states prior to all of this she was constantly in the bathroom and took an Immodium and then the difficulty bowel movements/lack of bowel movements started. Patient states that she also has a hernia that has been near her belly button for months that nobody has assessed. Patient states she is able to eat and drink normally.  Patient also added that the pain is not that bad right now but it had been so severe that she almost went to the Emergency Room on New Years .Patient is given home care advice and advised that there are no immediate appointments and it is recommended that she be seen in the next 24 hours.  It is recommended that she goes to be seen by a  provider today.  Patient is advised that urgent care is another option to have a provider be able to do a physical assessment in person.  Patient is also advised that if she gets worse, to go to the emergency room.  Patient verbalized understanding of this and states she will probably go to an urgent care to get it checked out.  Reason for Disposition  [1] MODERATE pain (e.g., interferes with normal activities) AND [2] pain comes and goes (cramps) AND [3] present > 24 hours  (Exception: Pain with Vomiting or Diarrhea - see that Guideline.)  Answer Assessment - Initial Assessment Questions 1. LOCATION: Where does it hurt?      Abdomen 2. RADIATION: Does the pain shoot anywhere else? (e.g., chest, back)     Where you get a pain when you have a bowel movement 3. ONSET: When did the pain begin? (e.g., minutes, hours or days ago)      December 31st--hard bowel movement and one small bowel movement today slightly easier 4. SUDDEN: Gradual or sudden onset?     Dec 31st 5. PATTERN Does the pain come and go, or is it constant?    - If it comes and goes: How long does it last? Do you have pain now?     (Note: Comes and goes means the pain is intermittent. It goes away completely between bouts.)    - If constant: Is it getting better, staying the same, or getting worse?      (Note: Constant means the pain never goes away completely; most  serious pain is constant and gets worse.)      Intermittent  6. SEVERITY: How bad is the pain?  (e.g., Scale 1-10; mild, moderate, or severe)    - MILD (1-3): Doesn't interfere with normal activities, abdomen soft and not tender to touch.     - MODERATE (4-7): Interferes with normal activities or awakens from sleep, abdomen tender to touch.     - SEVERE (8-10): Excruciating pain, doubled over, unable to do any normal activities.       Off and on pain 7. RECURRENT SYMPTOM: Have you ever had this type of stomach pain before? If Yes, ask: When was  the last time? and What happened that time?      No 8. CAUSE: What do you think is causing the stomach pain?     Unsure 9. RELIEVING/AGGRAVATING FACTORS: What makes it better or worse? (e.g., antacids, bending or twisting motion, bowel movement)     Unknown 10. OTHER SYMPTOMS: Do you have any other symptoms? (e.g., back pain, diarrhea, fever, urination pain, vomiting)       Patient also had a hernia around her belly. Patient denies back pain, blood in urine, or pain with urination. Patient states she did vomit once and she also has a lot of sinus trouble. She believes this mostly phlegm.  Protocols used: Abdominal Pain - Female-A-AH

## 2023-12-17 ENCOUNTER — Other Ambulatory Visit: Payer: Self-pay | Admitting: Family

## 2023-12-30 ENCOUNTER — Other Ambulatory Visit: Payer: Self-pay | Admitting: Family

## 2023-12-31 ENCOUNTER — Telehealth: Payer: Self-pay | Admitting: Internal Medicine

## 2023-12-31 MED ORDER — LEVOTHYROXINE SODIUM 100 MCG PO TABS: 100.0000 ug | ORAL_TABLET | Freq: Every day | ORAL | 0 refills | Status: DC

## 2023-12-31 NOTE — Addendum Note (Signed)
Addended by: Vickii Chafe on: 12/31/2023 01:25 PM   Modules accepted: Orders

## 2023-12-31 NOTE — Addendum Note (Signed)
Addended by: Vickii Chafe on: 12/31/2023 01:24 PM   Modules accepted: Orders

## 2023-12-31 NOTE — Telephone Encounter (Signed)
Rx sent as "print". It did it twice with second resent.   Attempted to reach pharmacy. Pharmacy is close until 2pm. Will try again.

## 2023-12-31 NOTE — Telephone Encounter (Signed)
Follow up call on request for RF- last filled 09/20/23 #90   Copied from CRM #[id number]. Topic: Clinical - Prescription Issue >> Dec 31, 2023 10:27 AM Theodis Sato wrote: Reason for CRM: Patients pharmacy requested levothyroxine (SYNTHROID) 100 MCG tablet [Pharmacy Med Name: Levothyroxine Sodium 100 MCG Oral Tablet] on 12/17/23  Patient has 1 day left of this medication and is requesting Dr. Fabian Sharp to fill this medication.

## 2023-12-31 NOTE — Telephone Encounter (Signed)
Attempted to reach pt. Left a detail message that Rx request will send in for a month supply and for pt to call us back at her convenience to schedule her annual check with Dr. Fabian Sharp.

## 2024-01-01 NOTE — Telephone Encounter (Signed)
Contacted the pharmacy and spoke to [place], the pharmacist. Follow up about the Rx. He states they did not received it and okay to take verbal order. Gave him the name of Rx, direction, prescriber. Inform him no refill- pt would need to schedule a f/u appt.   He states they will relay the message to pt. No further action is needed.

## 2024-01-04 ENCOUNTER — Telehealth: Payer: Self-pay

## 2024-01-04 NOTE — Telephone Encounter (Signed)
 Copied from CRM [id number]. Topic: Clinical - Prescription Issue >> Jan 01, 2024 12:31 PM Chantha C wrote: Reason for CRM: Patient is asking for refill until 02/04/24 at 10:30 am. As of right now, [facility] pharmacy does not have any refills. Informed patient, levothyroxine (SYNTHROID) 100 MCG tablet was sent to [facility] [address], [place] - [address], [place] [address] Phone:[phone]:[phone] on 12/31/23. Patient is asking for the refill to be sent again today and for enough until office visit 02/04/24, patient is out of medication today. Please contact patient through MyChart on this matter.

## 2024-01-05 ENCOUNTER — Other Ambulatory Visit: Payer: Self-pay | Admitting: Family

## 2024-01-05 MED ORDER — LEVOTHYROXINE SODIUM 100 MCG PO TABS: 100.0000 ug | ORAL_TABLET | Freq: Every day | ORAL | 0 refills | Status: DC

## 2024-01-11 ENCOUNTER — Telehealth: Payer: Self-pay

## 2024-01-11 ENCOUNTER — Other Ambulatory Visit: Payer: Self-pay | Admitting: Internal Medicine

## 2024-01-11 MED ORDER — LEVOTHYROXINE SODIUM 100 MCG PO TABS: 100.0000 ug | ORAL_TABLET | Freq: Every day | ORAL | 0 refills | Status: DC

## 2024-01-11 MED ORDER — FLUOXETINE HCL 10 MG PO CAPS: 10.0000 mg | ORAL_CAPSULE | Freq: Every day | ORAL | 0 refills | Status: DC

## 2024-01-11 NOTE — Addendum Note (Signed)
 Addended by: Vickii Chafe on: 01/11/2024 01:51 PM   Modules accepted: Orders

## 2024-01-11 NOTE — Telephone Encounter (Signed)
 Refilled 01/11/24

## 2024-01-11 NOTE — Telephone Encounter (Signed)
 Copied from CRM [id number]. Topic: Clinical - Prescription Issue >> Jan 11, 2024  9:29 AM Shelby Wong wrote: Reason for CRM: Patient states she has requested refills for two medications but knows she needs to be seen before she gets them. One Rx for levothyroxine was filled for her between now and the appointment to tie her over, but she states it will not get her to 02/04/24 and will be shy about 6 days. She now needs her Rx fluoxetine filled as well as she is completely out and afraid of the withdrawal symptoms as she can't skip this medication. Wants to know if the same can be done to tie her over until 03/27. Callback [phone]

## 2024-01-11 NOTE — Telephone Encounter (Signed)
 Attempted to reach pt. Left a detail message that Rx requests are sent. To call us back if have any questions.

## 2024-01-11 NOTE — Telephone Encounter (Signed)
 Copied from CRM [id number]. Topic: Clinical - Medication Refill >> Jan 11, 2024  9:21 AM Lorin Glass B wrote: Most Recent Primary Care Visit:  Provider: [place], BETTY G  Department: [facility]  Visit Type: OFFICE VISIT  Date: 10/13/2023  Medication: FLUoxetine (PROZAC) 10 MG capsule  Has the patient contacted their pharmacy? Yes, states clinic hasn't responded (Agent: If no, request that the patient contact the pharmacy for the refill. If patient does not wish to contact the pharmacy document the reason why and proceed with request.) (Agent: If yes, when and what did the pharmacy advise?)  Is this the correct pharmacy for this prescription? Yes If no, delete pharmacy and type the correct one.  This is the patient's preferred pharmacy:   [facility] [address], [place] - [address] [place] [address] Phone: [phone] Fax: [phone]   Has the prescription been filled recently? No  Is the patient out of the medication? Yes  Has the patient been seen for an appointment in the last year OR does the patient have an upcoming appointment? Yes  Can we respond through MyChart? Yes  Agent: Please be advised that Rx refills may take up to 3 business days. We ask that you follow-up with your pharmacy.

## 2024-02-03 NOTE — Progress Notes (Unsigned)
 No chief complaint on file.   HPI: Shelby Wong 73 y.o. come in for Chronic disease management  Gout  HT Mood Thyroid ROS: See pertinent positives and negatives per HPI.  Past Medical History:  Diagnosis Date   Allergic rhinitis    Anxiety    Depression    Diverticulosis of colon (without mention of hemorrhage)    Elevated LFTs    GERD (gastroesophageal reflux disease)    Hemorrhoids    Hiatal hernia    Hx of echocardiogram    a. Echo 3/14:  Mild LVH, EF 55-65%, normal wall motion, PASP 33   Hypothyroidism    IBS (irritable bowel syndrome)    Myocarditis (HCC)    a. NSTEMI:  12/2011 with peak troponin 2. Cath - mild nonobstructive plaque.;  b. nstemi 01/2012, relook cath - nonobs - MRI sugg of myocarditis   Obesity    POLYP, ANAL AND RECTAL 09/07/2007   Qualifier: Diagnosis of  By: Lawernce Ion, CMA (AAMA), Bethann Berkshire    Pulmonary HTN (HCC)    Enlarged pulmonary artery by CT angio 12/2011 - PAP by echo 12/2011   Shingles 07/09/2012   Transaminitis    Abd [place] 12/2011 - hepatic steatosis    Family History  Problem Relation Age of Onset   Arthritis Mother        osteo   Breast cancer Mother    Coronary artery disease Mother    Other Mother        hearing loss   Diabetes Brother        24 s   Thyroid disease Mother    Colon cancer Neg Hx     Social History   Socioeconomic History   Marital status: Divorced    Spouse name: Not on file   Number of children: 1   Years of education: Not on file   Highest education level: Some college, no degree  Occupational History   Occupation: Chemical engineer Rep  Tobacco Use   Smoking status: Former    Current packs/day: 0.00    Types: Cigarettes    Quit date: 09/11/2006    Years since quitting: 17.4   Smokeless tobacco: Never  Substance and Sexual Activity   Alcohol use: Yes    Comment: occassional   Drug use: No   Sexual activity: Not Currently    Birth control/protection: Post-menopausal  Other Topics Concern   Not on  file  Social History Narrative   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  Moved in with family sister    HH of 1   Divorced   Regular exercise- no   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, 2008   G1P1   0 Caffeine drinks daily    Lives with brother  [facility]   Social Drivers of Health   Financial Resource Strain: Low Risk  (02/03/2024)   Overall Financial Resource Strain (CARDIA)    Difficulty of Paying Living Expenses: Not very hard  Food Insecurity: No Food Insecurity (02/03/2024)   Hunger Vital Sign    Worried About Running Out of Food in the Last Year: Never true    Ran Out of Food in the Last Year: Never true  Transportation Needs: No Transportation Needs (02/03/2024)   PRAPARE - Administrator, Civil Service (Medical): No    Lack of Transportation (Non-Medical): No  Physical Activity: Unknown (02/03/2024)   Exercise  Vital Sign    Days of Exercise per Week: 0 days    Minutes of Exercise per Session: Not on file  Stress: No Stress Concern Present (02/03/2024)   [employer] of Occupational Health - Occupational Stress Questionnaire    Feeling of Stress : Only a little  Social Connections: Unknown (02/03/2024)   Social Connection and Isolation Panel [NHANES]    Frequency of Communication with Friends and Family: Patient declined    Frequency of Social Gatherings with Friends and Family: Patient declined    Attends Religious Services: Patient declined    Database administrator or Organizations: No    Attends Engineer, structural: Not on file    Marital Status: Divorced    Outpatient Medications Prior to Visit  Medication Sig Dispense Refill   acetaminophen (TYLENOL) 650 MG CR tablet Take 1,300 mg by mouth every 8 (eight) hours.     allopurinol (ZYLOPRIM) 100 MG tablet Take 1 tablet by mouth twice daily 180 tablet 2   Esomeprazole Magnesium (NEXIUM PO) Take by mouth.     FLUoxetine (PROZAC) 10 MG  capsule Take 1 capsule (10 mg total) by mouth daily. 30 capsule 0   fluticasone (FLONASE) 50 MCG/ACT nasal spray Place 1 spray into both nostrils daily. 16 g 0   levothyroxine (SYNTHROID) 100 MCG tablet Take 1 tablet (100 mcg total) by mouth daily. 30 tablet 0   lisinopril-hydrochlorothiazide (ZESTORETIC) 10-12.5 MG tablet Take 1 tablet by mouth daily. 90 tablet 3   Loratadine 10 MG CAPS Take 10 mg by mouth daily as needed. As needed for sinus     No facility-administered medications prior to visit.     EXAM:  There were no vitals taken for this visit.  There is no height or weight on file to calculate BMI.  GENERAL: vitals reviewed and listed above, alert, oriented, appears well hydrated and in no acute distress HEENT: atraumatic, conjunctiva  clear, no obvious abnormalities on inspection of external nose and ears OP : no lesion edema or exudate  NECK: no obvious masses on inspection palpation  LUNGS: clear to auscultation bilaterally, no wheezes, rales or rhonchi, good air movement CV: HRRR, no clubbing cyanosis or  peripheral edema nl cap refill  MS: moves all extremities without noticeable focal  abnormality PSYCH: pleasant and cooperative, no obvious depression or anxiety Lab Results  Component Value Date   WBC 6.8 02/04/2023   HGB 12.3 02/04/2023   HCT 37.9 02/04/2023   PLT 265.0 02/04/2023   GLUCOSE 127 (H) 02/04/2023   CHOL 198 02/04/2023   TRIG 163.0 (H) 02/04/2023   HDL 47.70 02/04/2023   LDLCALC 118 (H) 02/04/2023   ALT 8 02/04/2023   AST 19 02/04/2023   NA 140 02/04/2023   K 3.8 02/04/2023   CL 100 02/04/2023   CREATININE 1.17 02/04/2023   BUN 21 02/04/2023   CO2 30 02/04/2023   TSH 2.30 02/04/2023   INR 1.16 01/16/2012   HGBA1C 6.6 (H) 02/04/2023   BP Readings from Last 3 Encounters:  10/13/23 124/70  02/04/23 118/60  04/04/22 108/62    ASSESSMENT AND PLAN:  Discussed the following assessment and plan:  No diagnosis found.  -Patient advised to  return or notify health care team  if  new concerns arise.  There are no Patient Instructions on file for this visit.   Neta Mends. Elizabeth Paulsen M.D.

## 2024-02-04 ENCOUNTER — Ambulatory Visit (INDEPENDENT_AMBULATORY_CARE_PROVIDER_SITE_OTHER): Payer: PPO | Admitting: Internal Medicine

## 2024-02-04 ENCOUNTER — Other Ambulatory Visit: Payer: Self-pay

## 2024-02-04 ENCOUNTER — Encounter: Payer: Self-pay | Admitting: Internal Medicine

## 2024-02-04 VITALS — BP 100/60 | HR 80 | Temp 98.1°F | Wt 242.0 lb

## 2024-02-04 DIAGNOSIS — M25561 Pain in right knee: Secondary | ICD-10-CM | POA: Diagnosis not present

## 2024-02-04 DIAGNOSIS — Z79899 Other long term (current) drug therapy: Secondary | ICD-10-CM | POA: Diagnosis not present

## 2024-02-04 DIAGNOSIS — E785 Hyperlipidemia, unspecified: Secondary | ICD-10-CM

## 2024-02-04 DIAGNOSIS — I10 Essential (primary) hypertension: Secondary | ICD-10-CM

## 2024-02-04 DIAGNOSIS — E790 Hyperuricemia without signs of inflammatory arthritis and tophaceous disease: Secondary | ICD-10-CM | POA: Diagnosis not present

## 2024-02-04 DIAGNOSIS — K429 Umbilical hernia without obstruction or gangrene: Secondary | ICD-10-CM | POA: Diagnosis not present

## 2024-02-04 DIAGNOSIS — R739 Hyperglycemia, unspecified: Secondary | ICD-10-CM | POA: Diagnosis not present

## 2024-02-04 DIAGNOSIS — Z1231 Encounter for screening mammogram for malignant neoplasm of breast: Secondary | ICD-10-CM | POA: Diagnosis not present

## 2024-02-04 DIAGNOSIS — M25562 Pain in left knee: Secondary | ICD-10-CM | POA: Diagnosis not present

## 2024-02-04 DIAGNOSIS — E039 Hypothyroidism, unspecified: Secondary | ICD-10-CM | POA: Diagnosis not present

## 2024-02-04 DIAGNOSIS — G8929 Other chronic pain: Secondary | ICD-10-CM | POA: Diagnosis not present

## 2024-02-04 LAB — LIPID PANEL
Cholesterol: 179 mg/dL (ref 0–200)
HDL: 38.5 mg/dL — ABNORMAL LOW (ref >39.00)
LDL Cholesterol: 105 mg/dL — ABNORMAL HIGH (ref 0–99)
NonHDL: 140.19
Total CHOL/HDL Ratio: 5
Triglycerides: 177 mg/dL — ABNORMAL HIGH (ref 0.0–149.0)
VLDL: 35.4 mg/dL (ref 0.0–40.0)

## 2024-02-04 LAB — HEPATIC FUNCTION PANEL
ALT: 6 U/L (ref 0–35)
AST: 16 U/L (ref 0–37)
Albumin: 4.3 g/dL (ref 3.5–5.2)
Alkaline Phosphatase: 71 U/L (ref 39–117)
Bilirubin, Direct: 0.2 mg/dL (ref 0.0–0.3)
Total Bilirubin: 0.9 mg/dL (ref 0.2–1.2)
Total Protein: 7.4 g/dL (ref 6.0–8.3)

## 2024-02-04 LAB — BASIC METABOLIC PANEL WITH GFR
BUN: 18 mg/dL (ref 6–23)
CO2: 30 meq/L (ref 19–32)
Calcium: 9.5 mg/dL (ref 8.4–10.5)
Chloride: 101 meq/L (ref 96–112)
Creatinine, Ser: 1.19 mg/dL (ref 0.40–1.20)
GFR: 46.24 mL/min — ABNORMAL LOW (ref >60.00)
Glucose, Bld: 119 mg/dL — ABNORMAL HIGH (ref 70–99)
Potassium: 4 meq/L (ref 3.5–5.1)
Sodium: 139 meq/L (ref 135–145)

## 2024-02-04 LAB — CBC WITH DIFFERENTIAL/PLATELET
Basophils Absolute: 0 10*3/uL (ref 0.0–0.1)
Basophils Relative: 0.7 % (ref 0.0–3.0)
Eosinophils Absolute: 0.1 10*3/uL (ref 0.0–0.7)
Eosinophils Relative: 2.3 % (ref 0.0–5.0)
HCT: 39.1 % (ref 36.0–46.0)
Hemoglobin: 12.4 g/dL (ref 12.0–15.0)
Lymphocytes Relative: 21.8 % (ref 12.0–46.0)
Lymphs Abs: 1.3 10*3/uL (ref 0.7–4.0)
MCHC: 31.7 g/dL (ref 30.0–36.0)
MCV: 97.4 fl (ref 78.0–100.0)
Monocytes Absolute: 0.4 10*3/uL (ref 0.1–1.0)
Monocytes Relative: 5.7 % (ref 3.0–12.0)
Neutro Abs: 4.3 10*3/uL (ref 1.4–7.7)
Neutrophils Relative %: 69.5 % (ref 43.0–77.0)
Platelets: 267 10*3/uL (ref 150.0–400.0)
RBC: 4.02 Mil/uL (ref 3.87–5.11)
RDW: 15.4 % (ref 11.5–15.5)
WBC: 6.2 10*3/uL (ref 4.0–10.5)

## 2024-02-04 LAB — URIC ACID: Uric Acid, Serum: 5.1 mg/dL (ref 2.4–7.0)

## 2024-02-04 NOTE — Patient Instructions (Addendum)
 Good to see you today. Lab today  And then make cpe in 3 months or  as needed  Ok to stay on prozac .   Get mammogram as planned .   Will do referral for umbilical  hernia .    Marland Kitchen

## 2024-02-05 LAB — TSH: TSH: 2.73 u[IU]/mL (ref 0.35–5.50)

## 2024-02-06 ENCOUNTER — Other Ambulatory Visit: Payer: Self-pay | Admitting: Internal Medicine

## 2024-02-06 LAB — HEMOGLOBIN A1C: Hgb A1c MFr Bld: 6.5 % (ref 4.6–6.5)

## 2024-02-08 ENCOUNTER — Encounter: Payer: Self-pay | Admitting: Internal Medicine

## 2024-02-08 NOTE — Progress Notes (Signed)
 Blood sugar stable  in prediabetic  range  kidney function decrease but stable   no change from a year ago Thyroid  uric acid in range  Optimize nutritin and activity  and fu in summer as planned

## 2024-02-12 ENCOUNTER — Other Ambulatory Visit: Payer: Self-pay | Admitting: Internal Medicine

## 2024-02-22 ENCOUNTER — Other Ambulatory Visit: Payer: Self-pay

## 2024-02-22 MED ORDER — LEVOTHYROXINE SODIUM 100 MCG PO TABS: 100.0000 ug | ORAL_TABLET | Freq: Every day | ORAL | 2 refills | Status: DC

## 2024-02-23 ENCOUNTER — Telehealth: Payer: Self-pay | Admitting: *Deleted

## 2024-02-23 MED ORDER — FLUCONAZOLE 150 MG PO TABS: 150.0000 mg | ORAL_TABLET | Freq: Once | ORAL | 0 refills | Status: AC

## 2024-02-23 NOTE — Telephone Encounter (Signed)
 Spoke to pt.   Offer to schedule to an appointment. Pt declined offer and states she doesn't needs an appt for yeast infection. Pt states she had tried OTC med and it is messy and prefer pills.   Pt reports she has sx burning sensation, irritation and a little discharge. Going on 5-7 days.   Inform pt, can't guarantee on RX but will forward to Dr. Ethel Henry. Then will update pt.   Please advise.

## 2024-02-23 NOTE — Telephone Encounter (Signed)
 Attempted to reach pt. Left a detail message Rx sent. If have questions, to give us  call back.

## 2024-02-23 NOTE — Telephone Encounter (Signed)
 Copied from CRM [id number]. Topic: Clinical - Medication Refill >> Feb 22, 2024 12:48 PM Alyse July wrote: Most Recent Primary Care Visit:  Provider: Reginal Capra  Department: [facility]  Visit Type: OFFICE VISIT  Date: 02/04/2024  Medication: Diflucan  Has the patient contacted their pharmacy? No (Agent: If no, request that the patient contact the pharmacy for the refill. If patient does not wish to contact the pharmacy document the reason why and proceed with request.) (Agent: If yes, when and what did the pharmacy advise?)  Is this the correct pharmacy for this prescription? Yes If no, delete pharmacy and type the correct one.  This is the patient's preferred pharmacy:  [facility] [address], [place] - [address] [place] [address] Phone: [phone] Fax: [phone]  Has the prescription been filled recently? No  Is the patient out of the medication? Yes  Has the patient been seen for an appointment in the last year OR does the patient have an upcoming appointment? Yes  Can we respond through MyChart? Yes  Agent: Please be advised that Rx refills may take up to 3 business days. We ask that you follow-up with your pharmacy.   Reason for CRM: patient would like to know if medication Diflucan can be called into the pharmacy patient currently has a yeast infection(4-5 days).

## 2024-02-26 ENCOUNTER — Encounter (HOSPITAL_BASED_OUTPATIENT_CLINIC_OR_DEPARTMENT_OTHER): Payer: Self-pay | Admitting: Radiology

## 2024-02-26 ENCOUNTER — Ambulatory Visit (HOSPITAL_BASED_OUTPATIENT_CLINIC_OR_DEPARTMENT_OTHER)
Admission: RE | Admit: 2024-02-26 | Discharge: 2024-02-26 | Disposition: A | Discharge status: Home / Self Care | Source: Ambulatory Visit | Attending: Internal Medicine | Admitting: Internal Medicine

## 2024-02-26 DIAGNOSIS — Z1231 Encounter for screening mammogram for malignant neoplasm of breast: Secondary | ICD-10-CM | POA: Insufficient documentation

## 2024-02-28 ENCOUNTER — Other Ambulatory Visit: Payer: Self-pay | Admitting: Family

## 2024-03-11 ENCOUNTER — Other Ambulatory Visit: Payer: Self-pay

## 2024-03-11 ENCOUNTER — Telehealth: Payer: Self-pay

## 2024-03-11 MED ORDER — FLUOXETINE HCL 10 MG PO CAPS: 10.0000 mg | ORAL_CAPSULE | Freq: Every day | ORAL | 0 refills | Status: DC

## 2024-03-11 NOTE — Telephone Encounter (Signed)
 Copied from CRM [id number]. Topic: Clinical - Prescription Issue >> Mar 11, 2024 12:53 PM Shelby Wong wrote: Reason for CRM: pt is still needing her FLUoxetine  HCl 10 mg medication - medication has been pending since 04/20- pt is currently out of the medication

## 2024-03-11 NOTE — Telephone Encounter (Signed)
 Rx sent. Contacted pt. Left a detail message a medication is sent.

## 2024-04-03 ENCOUNTER — Other Ambulatory Visit: Payer: Self-pay | Admitting: Internal Medicine

## 2024-04-06 ENCOUNTER — Other Ambulatory Visit: Payer: Self-pay | Admitting: Internal Medicine

## 2024-04-29 ENCOUNTER — Ambulatory Visit (INDEPENDENT_AMBULATORY_CARE_PROVIDER_SITE_OTHER): Admitting: Adult Health

## 2024-04-29 VITALS — BP 120/62 | HR 105 | Temp 98.1°F | Ht 64.0 in | Wt 233.2 lb

## 2024-04-29 DIAGNOSIS — K2900 Acute gastritis without bleeding: Secondary | ICD-10-CM

## 2024-04-29 MED ORDER — SUCRALFATE 1 G PO TABS: 1.0000 g | ORAL_TABLET | Freq: Three times a day (TID) | ORAL | 0 refills | Status: AC

## 2024-04-29 NOTE — Progress Notes (Signed)
 Subjective:    Patient ID: Shelby Wong, female    DOB: Feb 07, 1951, 73 y.o.   MRN: [id number]  HPI 73 year old female who  has a past medical history of Allergic rhinitis, Anxiety, Depression, Diverticulosis of colon (without mention of hemorrhage), Elevated LFTs, GERD (gastroesophageal reflux disease), Hemorrhoids, Hiatal hernia, echocardiogram, Hypothyroidism, IBS (irritable bowel syndrome), Myocarditis (HCC), Obesity, POLYP, ANAL AND RECTAL (09/07/2007), Pulmonary HTN (HCC), Shingles (07/09/2012), and Transaminitis.  She is a patient of Dr. Ethel Henry who I am seeing today for an acute issue.She reports stomach discomfort for atleast a week. She reports a burning sensation throughout her stomach. She also has bloating. She has been taking Nexium  daily for years. Discomfort is intermittent throughout the day.   She denies n/v/d/fevers, or chills. She does not take NSAIDS.    Review of Systems See HPI   Past Medical History:  Diagnosis Date   Allergic rhinitis    Anxiety    Depression    Diverticulosis of colon (without mention of hemorrhage)    Elevated LFTs    GERD (gastroesophageal reflux disease)    Hemorrhoids    Hiatal hernia    Hx of echocardiogram    a. Echo 3/14:  Mild LVH, EF 55-65%, normal wall motion, PASP 33   Hypothyroidism    IBS (irritable bowel syndrome)    Myocarditis (HCC)    a. NSTEMI:  12/2011 with peak troponin 2. Cath - mild nonobstructive plaque.;  b. nstemi 01/2012, relook cath - nonobs - MRI sugg of myocarditis   Obesity    POLYP, ANAL AND RECTAL 09/07/2007   Qualifier: Diagnosis of  By: Bambi Bonine, CMA (AAMA), Michaeline Adolf    Pulmonary HTN (HCC)    Enlarged pulmonary artery by CT angio 12/2011 - PAP by echo 12/2011   Shingles 07/09/2012   Transaminitis    Abd [place]  12/2011 - hepatic steatosis    Social History   Socioeconomic History   Marital status: Divorced    Spouse name: Not on file   Number of children: 1   Years of education: Not on file   Highest  education level: Some college, no degree  Occupational History   Occupation: Chemical engineer Rep  Tobacco Use   Smoking status: Former    Current packs/day: 0.00    Types: Cigarettes    Quit date: 09/11/2006    Years since quitting: 17.6   Smokeless tobacco: Never  Substance and Sexual Activity   Alcohol use: Yes    Comment: occassional   Drug use: No   Sexual activity: Not Currently    Birth control/protection: Post-menopausal  Other Topics Concern   Not on file  Social History Narrative   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  Moved in with family sister    HH of 1   Divorced   Regular exercise- no   Was caretaker for mom who died in 11/13/08   G1P1   0 Caffeine drinks daily    Lives with brother  [facility]   Social Drivers of Health   Financial Resource Strain: Low Risk  (04/29/2024)   Overall Financial Resource Strain (CARDIA)    Difficulty of Paying Living Expenses: Not very hard  Food Insecurity: No Food Insecurity (04/29/2024)   Hunger Vital Sign    Worried About Running Out of Food in the Last Year: Never true    Ran Out of Food in the Last  Year: Never true  Transportation Needs: No Transportation Needs (04/29/2024)   PRAPARE - Administrator, Civil Service (Medical): No    Lack of Transportation (Non-Medical): No  Physical Activity: Inactive (04/29/2024)   Exercise Vital Sign    Days of Exercise per Week: 0 days    Minutes of Exercise per Session: Not on file  Stress: No Stress Concern Present (04/29/2024)   [employer] of Occupational Health - Occupational Stress Questionnaire    Feeling of Stress: Only a little  Social Connections: Unknown (04/29/2024)   Social Connection and Isolation Panel    Frequency of Communication with Friends and Family: Patient declined    Frequency of Social Gatherings with Friends and Family: Patient declined    Attends Religious Services: Never    Doctor, general practice or Organizations: No    Attends Engineer, structural: Not on file    Marital Status: Divorced  Catering manager Violence: Not on file    Past Surgical History:  Procedure Laterality Date   CHOLECYSTECTOMY     LEFT AND RIGHT HEART CATHETERIZATION WITH CORONARY ANGIOGRAM N/A 01/16/2012   Procedure: LEFT AND RIGHT HEART CATHETERIZATION WITH CORONARY ANGIOGRAM;  Surgeon: Loyde Rule, MD;  Location: [facility] CATH LAB;  Service: Cardiovascular;  Laterality: N/A;   LEFT HEART CATHETERIZATION WITH CORONARY ANGIOGRAM N/A 12/15/2011   Procedure: LEFT HEART CATHETERIZATION WITH CORONARY ANGIOGRAM;  Surgeon: Peter M Swaziland, MD;  Location: [facility] CATH LAB;  Service: Cardiovascular;  Laterality: N/A;   TONSILLECTOMY AND ADENOIDECTOMY      Family History  Problem Relation Age of Onset   Arthritis Mother        osteo   Breast cancer Mother    Coronary artery disease Mother    Other Mother        hearing loss   Diabetes Brother        42 s   Thyroid  disease Mother    Colon cancer Neg Hx     No Known Allergies  Current Outpatient Medications on File Prior to Visit  Medication Sig Dispense Refill   acetaminophen  (TYLENOL ) 650 MG CR tablet Take 1,300 mg by mouth every 8 (eight) hours.     allopurinol  (ZYLOPRIM ) 100 MG tablet Take 1 tablet by mouth twice daily 180 tablet 0   Esomeprazole  Magnesium  (NEXIUM  PO) Take by mouth.     FLUoxetine  (PROZAC ) 10 MG capsule Take 1 capsule by mouth once daily 30 capsule 0   fluticasone  (FLONASE ) 50 MCG/ACT nasal spray Place 1 spray into both nostrils daily. 16 g 0   levothyroxine  (SYNTHROID ) 100 MCG tablet Take 1 tablet (100 mcg total) by mouth daily. 30 tablet 2   lisinopril -hydrochlorothiazide  (ZESTORETIC ) 10-12.5 MG tablet Take 1 tablet by mouth once daily 90 tablet 0   Loratadine  10 MG CAPS Take 10 mg by mouth daily as needed. As needed for sinus     No current facility-administered medications on file prior to visit.    BP 120/62 (BP Location:  Left Arm, Patient Position: Sitting)   Pulse (!) 105   Temp 98.1 F (36.7 C) (Oral)   Ht 5' 4 (1.626 m)   Wt 233 lb 3.2 oz (105.8 kg)   SpO2 97%   BMI 40.03 kg/m       Objective:   Physical Exam Vitals and nursing note reviewed.  Constitutional:      Appearance: Normal appearance.   Cardiovascular:     Rate and Rhythm: Normal rate and  regular rhythm.     Pulses: Normal pulses.     Heart sounds: Normal heart sounds.  Pulmonary:     Effort: Pulmonary effort is normal.     Breath sounds: Normal breath sounds.  Abdominal:     General: Abdomen is flat. There is no distension.     Palpations: Abdomen is soft. There is no mass.     Tenderness: There is no abdominal tenderness. There is no guarding or rebound.     Hernia: No hernia is present.   Musculoskeletal:        General: Normal range of motion.   Skin:    General: Skin is warm and dry.     Capillary Refill: Capillary refill takes less than 2 seconds.   Neurological:     General: No focal deficit present.     Mental Status: She is alert and oriented to person, place, and time.   Psychiatric:        Mood and Affect: Mood normal.        Behavior: Behavior normal.        Thought Content: Thought content normal.        Judgment: Judgment normal.       Assessment & Plan:  1. Acute gastritis without hemorrhage, unspecified gastritis type (Primary) - Gastritis vs GERD. Not concerned for H.pylori. Will keep her on Nexium  and precribe Carafate .  - Follow up if not resolving  - sucralfate  (CARAFATE ) 1 g tablet; Take 1 tablet (1 g total) by mouth 4 (four) times daily -  with meals and at bedtime for 14 days.  Dispense: 56 tablet; Refill: 0  Alto Atta, NP

## 2024-04-30 ENCOUNTER — Other Ambulatory Visit: Payer: Self-pay | Admitting: Internal Medicine

## 2024-05-12 ENCOUNTER — Ambulatory Visit (INDEPENDENT_AMBULATORY_CARE_PROVIDER_SITE_OTHER): Admitting: Internal Medicine

## 2024-05-12 ENCOUNTER — Encounter: Payer: Self-pay | Admitting: Internal Medicine

## 2024-05-12 VITALS — BP 86/56 | HR 84 | Temp 97.7°F | Ht 64.0 in | Wt 232.8 lb

## 2024-05-12 DIAGNOSIS — E039 Hypothyroidism, unspecified: Secondary | ICD-10-CM | POA: Diagnosis not present

## 2024-05-12 DIAGNOSIS — E785 Hyperlipidemia, unspecified: Secondary | ICD-10-CM | POA: Diagnosis not present

## 2024-05-12 DIAGNOSIS — Z0000 Encounter for general adult medical examination without abnormal findings: Secondary | ICD-10-CM | POA: Diagnosis not present

## 2024-05-12 DIAGNOSIS — I10 Essential (primary) hypertension: Secondary | ICD-10-CM

## 2024-05-12 DIAGNOSIS — K449 Diaphragmatic hernia without obstruction or gangrene: Secondary | ICD-10-CM

## 2024-05-12 DIAGNOSIS — R031 Nonspecific low blood-pressure reading: Secondary | ICD-10-CM

## 2024-05-12 DIAGNOSIS — Z79899 Other long term (current) drug therapy: Secondary | ICD-10-CM

## 2024-05-12 DIAGNOSIS — Z23 Encounter for immunization: Secondary | ICD-10-CM

## 2024-05-12 DIAGNOSIS — R739 Hyperglycemia, unspecified: Secondary | ICD-10-CM | POA: Diagnosis not present

## 2024-05-12 LAB — POCT GLYCOSYLATED HEMOGLOBIN (HGB A1C): Hemoglobin A1C: 6 % — AB (ref 4.0–5.6)

## 2024-05-12 NOTE — Patient Instructions (Addendum)
 Cont  knee exercises Hg A1c is 6.0  Lets stop the bp medicaiton for now and plan fu in 2 months  Consider gi consult if  ongoing gi sx despite changes.

## 2024-05-12 NOTE — Progress Notes (Signed)
 Chief Complaint  Patient presents with   Annual Exam    Pt reports she is super tired today and didn't a good sleep. Pt has question about 2 series dose vaccine that can get one dose.     HPI: Patient  Shelby Wong  73 y.o. comes in today for Preventive Health Care visit  And fu Chronic disease management  Seen CN last month for poss gastritis and rx ppi and carafate  a bit better but still stomach is burning post eating no vomiting or bowel changes  HT taking lisinopril  HCTZ low-dose usually takes in the morning does feel tired no history of hypotension or syncope but is not checking her readings.  Did not sleep well last night Resp no current acute issues THyroid  taking medicine regularly. Needs a problem no regular exercise but does do her knee rehab exercises which has been helpful and she has lost a few pounds since the last check. Finally got her mammogram.  Health Maintenance  Topic Date Due   Medicare Annual Wellness (AWV)  Never done   COVID-19 Vaccine (2 - Pfizer risk series) 05/28/2024 (Originally 01/10/2022)   Colonoscopy  07/13/2024 (Originally 01/30/2018)   Zoster Vaccines- Shingrix (1 of 2) 08/12/2024 (Originally 04/30/1970)   DTaP/Tdap/Td (2 - Tdap) 05/12/2025 (Originally 04/01/2020)   DEXA SCAN  05/12/2025 (Originally 04/30/2018)   INFLUENZA VACCINE  06/10/2024   MAMMOGRAM  02/25/2026   Pneumococcal Vaccine: 50+ Years  Completed   Hepatitis C Screening  Completed   Hepatitis B Vaccines  Aged Out   HPV VACCINES  Aged Out   Meningococcal B Vaccine  Aged Out   Health Maintenance Review LIFESTYLE:  Exercise:  knee exercises .  Stairs   .  Doing the pt  for knee  Tobacco/ETS: no sis in law ets  Alcohol:  rare Sugar beverages:no Sleep: interrupted  Drug use: no HH of   3  2 pets    ROS:  GEN/ HEENT: No fever, significant weight changes sweats headaches vision problems hearing changes, CV/ PULM; No chest pain shortness of breath cough, syncope,edema  change in  exercise tolerance. GI /GU: No adominal pain, vomiting, change in bowel habits. No blood in the stool. No significant GU symptoms. SKIN/HEME: ,no acute skin rashes suspicious lesions or bleeding. No lymphadenopathy, nodules, masses.  NEURO/ PSYCH:  No neurologic signs such as weakness numbness. No depression anxiety. IMM/ Allergy: No unusual infections.  Allergy .   REST of 12 system review negative except as per HPI   Past Medical History:  Diagnosis Date   Allergic rhinitis    Anxiety    Depression    Diverticulosis of colon (without mention of hemorrhage)    Elevated LFTs    GERD (gastroesophageal reflux disease)    Hemorrhoids    Hiatal hernia    Hx of echocardiogram    a. Echo 3/14:  Mild LVH, EF 55-65%, normal wall motion, PASP 33   Hypothyroidism    IBS (irritable bowel syndrome)    Myocarditis (HCC)    a. NSTEMI:  12/2011 with peak troponin 2. Cath - mild nonobstructive plaque.;  b. nstemi 01/2012, relook cath - nonobs - MRI sugg of myocarditis   Obesity    POLYP, ANAL AND RECTAL 09/07/2007   Qualifier: Diagnosis of  By: Laurice, CMA (AAMA), Clotilda RAMAN    Pulmonary HTN (HCC)    Enlarged pulmonary artery by CT angio 12/2011 - PAP by echo 12/2011   Shingles 07/09/2012   Transaminitis  Abd US  12/2011 - hepatic steatosis    Past Surgical History:  Procedure Laterality Date   CHOLECYSTECTOMY     LEFT AND RIGHT HEART CATHETERIZATION WITH CORONARY ANGIOGRAM N/A 01/16/2012   Procedure: LEFT AND RIGHT HEART CATHETERIZATION WITH CORONARY ANGIOGRAM;  Surgeon: Maude JAYSON Emmer, MD;  Location[facility] C[facility] LAB;  Service: Cardiovascular;  Laterality: N/A;   LEFT HEART CATHETERIZATION WITH CORONARY ANGIOGRAM N/A 12/15/2011   Procedure: LEFT HEART CATHETERIZATION WITH CORONARY ANGIOGRAM;  Surgeon: Peter M Swaziland, MD;  Location: [facility] CATH LAB;  Service: Cardiovascular;  Laterality: N/A;   TONSILLECTOMY AND ADENOIDECTOMY      Family History  Problem Relation Age of Onset   Arthritis Mother         osteo   Breast cancer Mother    Coronary artery disease Mother    Other Mother        hearing loss   Diabetes Brother        63 s   Thyroid  disease Mother    Colon cancer Neg Hx     Social History   Socioeconomic History   Marital status: Divorced    Spouse name: Not on file   Number of children: 1   Years of education: Not on file   Highest education level: Some college, no degree  Occupational History   Occupation: Chemical engineer Rep  Tobacco Use   Smoking status: Former    Current packs/day: 0.00    Types: Cigarettes    Quit date: 09/11/2006    Years since quitting: 17.6   Smokeless tobacco: Never  Substance and Sexual Activity   Alcohol use: Yes    Comment: occassional   Drug use: No   Sexual activity: Not Currently    Birth control/protection: Post-menopausal  Other Topics Concern   Not on file  Social History Narrative   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  Moved in with family sister    HH of 1   Divorced   Regular exercise- no   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   G1P1   0 Caffeine drinks daily    Lives with brother  [facility]   Social Drivers of Corporate investment banker Strain: Low Risk  (04/29/2024)   Overall Financial Resource Strain (CARDIA)    Difficulty of Paying Living Expenses: Not very hard  Food Insecurity: No Food Insecurity (04/29/2024)   Hunger Vital Sign    Worried About Running Out of Food in the Last Year: Never true    Ran Out of Food in the Last Year: Never true  Transportation Needs: No Transportation Needs (04/29/2024)   PRAPARE - Administrator, Civil Service (Medical): No    Lack of Transportation (Non-Medical): No  Physical Activity: Inactive (04/29/2024)   Exercise Vital Sign    Days of Exercise per Week: 0 days    Minutes of Exercise per Session: Not on file  Stress: No Stress Concern Present (04/29/2024)   [employer] of Occupational  Health - Occupational Stress Questionnaire    Feeling of Stress: Only a little  Social Connections: Unknown (04/29/2024)   Social Connection and Isolation Panel    Frequency of Communication with Friends and Family: Patient declined    Frequency of Social Gatherings with Friends and Family: Patient declined    Attends Religious Services: Never    Database administrator or Organizations: No  Attends Banker Meetings: Not on file    Marital Status: Divorced    Outpatient Medications Prior to Visit  Medication Sig Dispense Refill   acetaminophen  (TYLENOL ) 650 MG CR tablet Take 1,300 mg by mouth every 8 (eight) hours.     allopurinol  (ZYLOPRIM ) 100 MG tablet Take 1 tablet by mouth twice daily 180 tablet 0   Esomeprazole  Magnesium  (NEXIUM  PO) Take by mouth.     FLUoxetine  (PROZAC ) 10 MG capsule Take 1 capsule by mouth once daily 90 capsule 2   levothyroxine  (SYNTHROID ) 100 MCG tablet Take 1 tablet (100 mcg total) by mouth daily. 30 tablet 2   lisinopril -hydrochlorothiazide  (ZESTORETIC ) 10-12.5 MG tablet Take 1 tablet by mouth once daily 90 tablet 0   Loratadine  10 MG CAPS Take 10 mg by mouth daily as needed. As needed for sinus     sucralfate  (CARAFATE ) 1 g tablet Take 1 tablet (1 g total) by mouth 4 (four) times daily -  with meals and at bedtime for 14 days. 56 tablet 0   fluticasone  (FLONASE ) 50 MCG/ACT nasal spray Place 1 spray into both nostrils daily. (Patient not taking: Reported on 05/12/2024) 16 g 0   No facility-administered medications prior to visit.     EXAM:  BP (!) 86/56 (BP Location: Left Arm, Patient Position: Sitting, Cuff Size: Large)   Pulse 84   Temp 97.7 F (36.5 C) (Oral)   Ht 5' 4 (1.626 m)   Wt 232 lb 12.8 oz (105.6 kg)   SpO2 96%   BMI 39.96 kg/m   Body mass index is 39.96 kg/m. Wt Readings from Last 3 Encounters:  05/12/24 232 lb 12.8 oz (105.6 kg)  04/29/24 233 lb 3.2 oz (105.8 kg)  02/04/24 242 lb (109.8 kg)    Physical Exam: Vital  signs reviewed independent gait but did bring a walker today. HZW:Uypd is a well-developed well-nourished alert cooperative    who appearsr stated age in no acute distress.  HEENT: normocephalic atraumatic , Eyes: PERRL EOM's full, conjunctiva clear, Nares: paten,t no deformity discharge or tenderness., Ears: no deformity EAC's clear TMs with normal landmarks. Mouth: clear OP, no lesions, edema.  Moist mucous membranes. Dentition in adequate repair. NECK: supple without masses, thyromegaly or bruits. CHEST/PULM:  Clear to auscultation and percussion breath sounds equal no wheeze , rales or rhonchi. No chest wall deformities or tenderness. Breast: normal by inspection . No dimpling, discharge, masses, tenderness or discharge . CV: PMI is nondisplaced, S1 S2 no gallops, murmurs, rubs. Peripheral pulses are full without delay.No JVD .  ABDOMEN: Bowel sounds normal nontender  No guard or rebound, no hepato splenomegal no CVA tenderness.   Extremtities:  No clubbing cyanosis or edema, no acute joint swelling or redness no focal atrophy NEURO:  Oriented x3, cranial nerves 3-12 appear to be intact, no obvious focal weakness,gait within normal limits no abnormal reflexes or asymmetrical SKIN: No acute rashes normal turgor, color, no bruising or petechiae. PSYCH: Oriented, good eye contact, no obvious depression anxiety, cognition and judgment appear normal. LN: no cervical axillary adenopathy  Lab Results  Component Value Date   WBC 6.2 02/04/2024   HGB 12.4 02/04/2024   HCT 39.1 02/04/2024   PLT 267.0 02/04/2024   GLUCOSE 119 (H) 02/04/2024   CHOL 179 02/04/2024   TRIG 177.0 (H) 02/04/2024   HDL 38.50 (L) 02/04/2024   LDLCALC 105 (H) 02/04/2024   ALT 6 02/04/2024   AST 16 02/04/2024   NA 139 02/04/2024  K 4.0 02/04/2024   CL 101 02/04/2024   CREATININE 1.19 02/04/2024   BUN 18 02/04/2024   CO2 30 02/04/2024   TSH 2.73 02/04/2024   INR 1.16 01/16/2012   HGBA1C 6.0 (A) 05/12/2024    BP  Readings from Last 3 Encounters:  05/12/24 (!) 86/56  04/29/24 120/62  02/04/24 100/60    Lab results reviewed with patient   ASSESSMENT AND PLAN:  Discussed the following assessment and plan:    ICD-10-CM   1. Visit for preventive health examination  Z00.00     2. Medication management  Z79.899     3. Low blood pressure reading  R03.1     4. Essential hypertension, benign  I10     5. Hyperlipidemia, unspecified hyperlipidemia type  E78.5     6. Hyperglycemia  R73.9 POC HgB A1c    7. Hiatal hernia  K44.9     8. Hypothyroidism, unspecified type  E03.9     9. Need for pneumococcal vaccination  Z23 Pneumococcal conjugate vaccine 20-valent (Prevnar 20)      Prevnar 20 today   ? Gi Burning abd pain.  Can try taking Nexium  twice a day as opposed to switching medicine. Record shows  hx of HH vs (diaphramatic?)  Not sure accurate   endo in 2009 showd very liarge HH  Consider getting gi fu if ongoing  after  next 2-4 weeks   Hold antihypertensive for now bp  has no sx of hypotension.  She has no other causes but could be contribute to fatigue and better control could be related to her weight loss. Hg A1c today  Cont knee exercises  Return in about 2 months (around 07/13/2024).  Patient Care Team: Charlett Apolinar POUR, MD as PCP - General Jakie Alm SAUNDERS, MD (Gastroenterology) Wonda Sharper, MD (Cardiology) Patient Instructions   Cont  knee exercises Hg A1c is 6.0  Lets stop the bp medicaiton for now and plan fu in 2 months  Consider gi consult if  ongoing gi sx despite changes.  Camilla Skeen K. Kale Rondeau M.D.

## 2024-05-13 ENCOUNTER — Other Ambulatory Visit: Payer: Self-pay | Admitting: Internal Medicine

## 2024-07-03 ENCOUNTER — Other Ambulatory Visit: Payer: Self-pay | Admitting: Internal Medicine

## 2024-07-13 ENCOUNTER — Ambulatory Visit: Admitting: Internal Medicine

## 2024-07-13 ENCOUNTER — Encounter: Payer: Self-pay | Admitting: Internal Medicine

## 2024-07-13 VITALS — BP 142/90 | HR 81 | Temp 98.1°F | Ht 64.0 in | Wt 235.2 lb

## 2024-07-13 DIAGNOSIS — R519 Headache, unspecified: Secondary | ICD-10-CM

## 2024-07-13 DIAGNOSIS — I10 Essential (primary) hypertension: Secondary | ICD-10-CM | POA: Diagnosis not present

## 2024-07-13 DIAGNOSIS — Z79899 Other long term (current) drug therapy: Secondary | ICD-10-CM | POA: Diagnosis not present

## 2024-07-13 DIAGNOSIS — J309 Allergic rhinitis, unspecified: Secondary | ICD-10-CM | POA: Diagnosis not present

## 2024-07-13 MED ORDER — FLUTICASONE PROPIONATE 50 MCG/ACT NA SUSP: 2.0000 | Freq: Every day | NASAL | 6 refills | Status: AC

## 2024-07-13 NOTE — Patient Instructions (Addendum)
 Get back on  flonase  every day for sinus congestion.  Get back on  bp medication  See how headaches doing .. should  improve . In next weeks .   Get us  BP readings    after  3 -4 weeks.  My chart ok .   Then go from there .

## 2024-07-13 NOTE — Progress Notes (Signed)
 Chief Complaint  Patient presents with   Medical Management of Chronic Issues    F/u on BP    HPI: Shelby Wong 73 y.o. come in for Chronic disease management  See visit 7 25  low bp  80s and fatigue and stopped med ( lis hctz10/25) Here for fu   .  Fatigue some better  HA bothering for over a week  has hx mha and sinus  face congestion no cough fever  some neck pain . No nsaids .   ROS: See pertinent positives and negatives per HPI. No cv p ulm sx  at this time  no fever   Past Medical History:  Diagnosis Date   Allergic rhinitis    Anxiety    Depression    Diverticulosis of colon (without mention of hemorrhage)    Elevated LFTs    GERD (gastroesophageal reflux disease)    Hemorrhoids    Hiatal hernia    Hx of echocardiogram    a. Echo 3/14:  Mild LVH, EF 55-65%, normal wall motion, PASP 33   Hypothyroidism    IBS (irritable bowel syndrome)    Myocarditis (HCC)    a. NSTEMI:  12/2011 with peak troponin 2. Cath - mild nonobstructive plaque.;  b. nstemi 01/2012, relook cath - nonobs - MRI sugg of myocarditis   Obesity    POLYP, ANAL AND RECTAL 09/07/2007   Qualifier: Diagnosis of  By: Laurice, CMA (AAMA), Clotilda RAMAN    Pulmonary HTN (HCC)    Enlarged pulmonary artery by CT angio 12/2011 - PAP by echo 12/2011   Shingles 07/09/2012   Transaminitis    Abd US  12/2011 - hepatic steatosis    Family History  Problem Relation Age of Onset   Arthritis Mother        osteo   Breast cancer Mother    Coronary artery disease Mother    Other Mother        hearing loss   Diabetes Brother        28 s   Thyroid  disease Mother    Colon cancer Neg Hx     Social History   Socioeconomic History   Marital status: Divorced    Spouse name: Not on file   Number of children: 1   Years of education: Not on file   Highest education level: Some college, no degree  Occupational History   Occupation: Chemical engineer Rep  Tobacco Use   Smoking status: Former    Current packs/day: 0.00     Types: Cigarettes    Quit date: 09/11/2006    Years since quitting: 17.8   Smokeless tobacco: Never  Substance and Sexual Activity   Alcohol use: Yes    Comment: occassional   Drug use: No   Sexual activity: Not Currently    Birth control/protection: Post-menopausal  Other Topics Concern   Not on file  Social History Narrative   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  Moved in with family sister    HH of 1   Divorced   Regular exercise- no   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   G1P1   0 Caffeine drinks daily    Lives with brother  [facility]   Social Drivers of Health   Financial Resource Strain: Low Risk  (04/29/2024)   Overall Financial Resource Strain (CARDIA)    Difficulty of Paying Living Expenses:  Not very hard  Food Insecurity: No Food Insecurity (04/29/2024)   Hunger Vital Sign    Worried About Running Out of Food in the Last Year: Never true    Ran Out of Food in the Last Year: Never true  Transportation Needs: No Transportation Needs (04/29/2024)   PRAPARE - Administrator, Civil Service (Medical): No    Lack of Transportation (Non-Medical): No  Physical Activity: Inactive (04/29/2024)   Exercise Vital Sign    Days of Exercise per Week: 0 days    Minutes of Exercise per Session: Not on file  Stress: No Stress Concern Present (04/29/2024)   [employer] of Occupational Health - Occupational Stress Questionnaire    Feeling of Stress: Only a little  Social Connections: Unknown (04/29/2024)   Social Connection and Isolation Panel    Frequency of Communication with Friends and Family: Patient declined    Frequency of Social Gatherings with Friends and Family: Patient declined    Attends Religious Services: Never    Database administrator or Organizations: No    Attends Engineer, structural: Not on file    Marital Status: Divorced    Outpatient Medications Prior to Visit   Medication Sig Dispense Refill   acetaminophen  (TYLENOL ) 650 MG CR tablet Take 1,300 mg by mouth every 8 (eight) hours.     allopurinol  (ZYLOPRIM ) 100 MG tablet Take 1 tablet by mouth twice daily 180 tablet 0   Esomeprazole  Magnesium  (NEXIUM  PO) Take by mouth.     FLUoxetine  (PROZAC ) 10 MG capsule Take 1 capsule by mouth once daily 90 capsule 2   levothyroxine  (SYNTHROID ) 100 MCG tablet Take 1 tablet by mouth once daily 30 tablet 0   Loratadine  10 MG CAPS Take 10 mg by mouth daily as needed. As needed for sinus     sucralfate  (CARAFATE ) 1 g tablet Take 1 tablet (1 g total) by mouth 4 (four) times daily -  with meals and at bedtime for 14 days. 56 tablet 0   lisinopril -hydrochlorothiazide  (ZESTORETIC ) 10-12.5 MG tablet Take 1 tablet by mouth once daily (Patient not taking: Reported on 07/13/2024) 90 tablet 0   fluticasone  (FLONASE ) 50 MCG/ACT nasal spray Place 1 spray into both nostrils daily. (Patient not taking: Reported on 07/13/2024) 16 g 0   No facility-administered medications prior to visit.     EXAM:  BP (!) 142/90 (BP Location: Right Arm, Patient Position: Sitting, Cuff Size: Large)   Pulse 81   Temp 98.1 F (36.7 C) (Oral)   Ht 5' 4 (1.626 m)   Wt 235 lb 3.2 oz (106.7 kg)   SpO2 95%   BMI 40.37 kg/m   Body mass index is 40.37 kg/m.  GENERAL: vitals reviewed and listed above, alert, oriented, appears well hydrated and in no acute distress HEENT: atraumatic, conjunctiva  clear, no obvious abnormalities on inspection of external nose and ears mild congestionNECK: no obvious masses on inspection palpation  LUNGS: clear to auscultation bilaterally, no wheezes, rales or rhonchi, good air movement CV: HRRR, no clubbing cyanosis or  peripheral edema nl cap refill  MS: moves all extremities without noticeable focal  abnormality PSYCH: pleasant and cooperative, no obvious depression or anxiety  BP Readings from Last 3 Encounters:  07/13/24 (!) 142/90  05/12/24 (!) 86/56   04/29/24 120/62    ASSESSMENT AND PLAN:  Discussed the following assessment and plan:  Essential hypertension, benign  Headache, unspecified headache type  Allergic rhinitis, unspecified seasonality, unspecified trigger -  Plan: fluticasone  (FLONASE ) 50 MCG/ACT nasal spray  Medication management Bp readings back up to intervention range  Uncertain why  reading was so low last time but no obv serious cause had a severe back pain incident but better  took tylenol   Sinus  congestion pressure  and HA? No obv acute infection  to  add back flonase   and same bp medication since seems to have been effective and no se of YEARS    at this time .  Considered lower dose if BP  too low again  Fatigue seems to be a bit better uncertain cause  Consider  update labs if  persistent or progressive problems   See below instructions  -Patient advised to return or notify health care team  if  new concerns arise. 34 minutes record review and discussion about options and plans  Patient Instructions  Get back on  flonase  every day for sinus congestion.  Get back on  bp medication  See how headaches doing .. should  improve . In next weeks .   Get us  BP readings    after  3 -4 weeks.  My chart ok .   Then go from there .   Jasher Barkan K. Nikitha Mode M.D.

## 2024-07-19 ENCOUNTER — Telehealth: Payer: Self-pay

## 2024-07-19 NOTE — Progress Notes (Signed)
   07/19/2024  Patient ID: Shelby Wong, female   DOB: 05/26/1951, 73 y.o.   MRN: [id number]  Pharmacy Quality Measure Review  This patient is appearing on a report for being at risk of failing the adherence measure for hypertension (ACEi/ARB) medications this calendar year.   Medication: Lisinopril -hydrochlorothiazide  10-12.5mg  Last fill date: 02/12/24 for 90 day supply  Left voicemail for patient to return my call at their convenience. Patient had been holding med since July for low BP, was told to resume at appt on 07/13/24. Left voicemail to see if patient needs a new refill and to follow up on BP since med restarted.  Jon VEAR Lindau, PharmD Clinical Pharmacist [phone]

## 2024-07-22 ENCOUNTER — Telehealth: Payer: Self-pay

## 2024-07-22 NOTE — Progress Notes (Signed)
   07/22/2024  Patient ID: Jenkins Sabal, female   DOB: [birth year], 73 y.o.   MRN: [id number]  Pharmacy Quality Measure Review  This patient is appearing on a report for being at risk of failing the adherence measure for hypertension (ACEi/ARB) medications this calendar year.   Medication: Lisinopril -hydrochlorothiazide  10-12.5mg  Last fill date: 02/12/24 for 90 day supply  Left voicemail for patient to return my call at their convenience. Patient had been holding med since July for low BP, was told to resume at appt on 07/13/24. Left voicemail to see if patient needs a new refill and to follow up on BP since med restarted.  Jon VEAR Lindau, PharmD Clinical Pharmacist [phone]

## 2024-08-06 ENCOUNTER — Other Ambulatory Visit: Payer: Self-pay | Admitting: Internal Medicine

## 2024-08-11 ENCOUNTER — Other Ambulatory Visit: Payer: Self-pay | Admitting: Internal Medicine

## 2024-08-17 ENCOUNTER — Telehealth: Payer: Self-pay

## 2024-08-17 NOTE — Telephone Encounter (Signed)
 Copied from CRM #[id number]. Topic: General - Other >> Aug 17, 2024  3:40 PM Shelby Wong wrote: Reason for CRM: Pt is calling to inform Dr.Panosh that her BP reading yesterday was 111/62.

## 2024-08-20 NOTE — Telephone Encounter (Signed)
 Thanks for the update . Reading is in a good range  now. No changes advised

## 2024-09-07 ENCOUNTER — Other Ambulatory Visit: Payer: Self-pay | Admitting: Internal Medicine

## 2024-11-06 ENCOUNTER — Other Ambulatory Visit: Payer: Self-pay | Admitting: Internal Medicine

## 2024-11-07 ENCOUNTER — Other Ambulatory Visit: Payer: Self-pay | Admitting: Internal Medicine

## 2024-11-09 ENCOUNTER — Other Ambulatory Visit: Payer: Self-pay | Admitting: Internal Medicine

## 2024-11-11 NOTE — Progress Notes (Signed)
 Shelby Wong                                          MRN: [id number]   11/11/2024   The [facility] Quality Team Specialist reviewed this patient medical record for the purposes of chart review for care gap closure. The following were reviewed: chart review for care gap closure-controlling blood pressure.    [facility] Quality Team
